# Patient Record
Sex: Male | Born: 1940 | Race: Black or African American | Hispanic: No | Smoking: Never smoker
Health system: Southern US, Community
[De-identification: ages and names within clinical notes are randomized; demographics above are authoritative.]

## PROBLEM LIST (undated history)

## (undated) DIAGNOSIS — G912 (Idiopathic) normal pressure hydrocephalus: Secondary | ICD-10-CM

## (undated) DIAGNOSIS — E119 Type 2 diabetes mellitus without complications: Secondary | ICD-10-CM

## (undated) DIAGNOSIS — G43909 Migraine, unspecified, not intractable, without status migrainosus: Secondary | ICD-10-CM

## (undated) DIAGNOSIS — J42 Unspecified chronic bronchitis: Secondary | ICD-10-CM

## (undated) DIAGNOSIS — N412 Abscess of prostate: Secondary | ICD-10-CM

## (undated) DIAGNOSIS — I10 Essential (primary) hypertension: Secondary | ICD-10-CM

## (undated) DIAGNOSIS — E785 Hyperlipidemia, unspecified: Secondary | ICD-10-CM

## (undated) HISTORY — PX: TONSILLECTOMY: SUR1361

## (undated) HISTORY — PX: BRAIN SURGERY: SHX531

---

## 2006-01-20 ENCOUNTER — Inpatient Hospital Stay (HOSPITAL_COMMUNITY): Admission: EM | Admit: 2006-01-20 | Discharge: 2006-01-22 | Payer: Self-pay | Admitting: *Deleted

## 2006-01-25 ENCOUNTER — Encounter: Payer: Self-pay | Admitting: Emergency Medicine

## 2006-01-25 ENCOUNTER — Inpatient Hospital Stay (HOSPITAL_COMMUNITY): Admission: AD | Admit: 2006-01-25 | Discharge: 2006-02-15 | Payer: Self-pay | Admitting: Neurosurgery

## 2006-02-08 ENCOUNTER — Encounter (INDEPENDENT_AMBULATORY_CARE_PROVIDER_SITE_OTHER): Payer: Self-pay | Admitting: *Deleted

## 2006-02-21 ENCOUNTER — Encounter: Admission: RE | Admit: 2006-02-21 | Discharge: 2006-02-21 | Payer: Self-pay | Admitting: Neurosurgery

## 2006-03-11 ENCOUNTER — Encounter: Admission: RE | Admit: 2006-03-11 | Discharge: 2006-03-11 | Payer: Self-pay | Admitting: Neurosurgery

## 2006-06-05 ENCOUNTER — Encounter: Admission: RE | Admit: 2006-06-05 | Discharge: 2006-06-05 | Payer: Self-pay | Admitting: Neurosurgery

## 2006-07-08 ENCOUNTER — Encounter: Admission: RE | Admit: 2006-07-08 | Discharge: 2006-07-08 | Payer: Self-pay | Admitting: Neurosurgery

## 2006-11-04 IMAGING — CT CT HEAD W/O CM
1 series · 16 of 28 positions shown, 20 images · IV contrast (agent unspecified)
Comparison: 01/29/2006.

CLINICAL DATA: Headache.  Nausea.  Known hydrocephalus.
HEAD CT WITHOUT CONTRAST:
TECHNIQUE: Contiguous axial images were obtained from the base of the skull through the vertex according to standard protocol without contrast.

[Series 2: brain · axial · 0.47mm/px · z∈[+130,+252]mm · 16 of 28 slices shown, 20 images]
[im 2/28  brain]
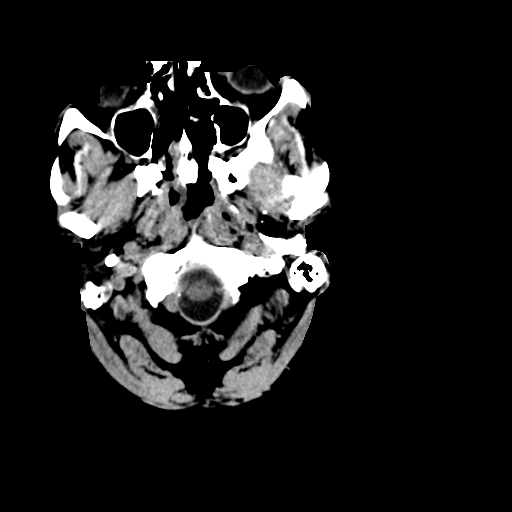
[im 2/28  bone]
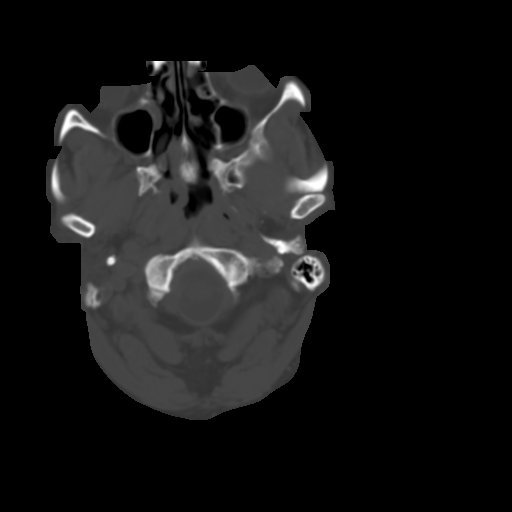
[im 4/28  brain]
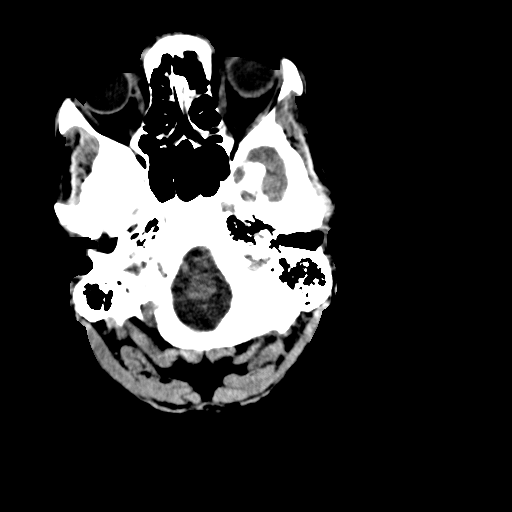
[im 6/28  brain]
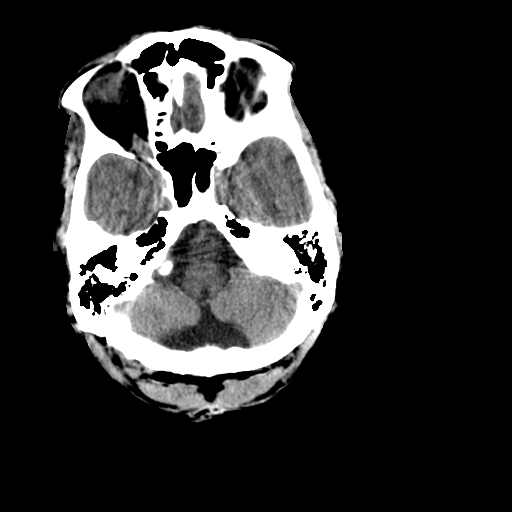
[im 7/28  brain]
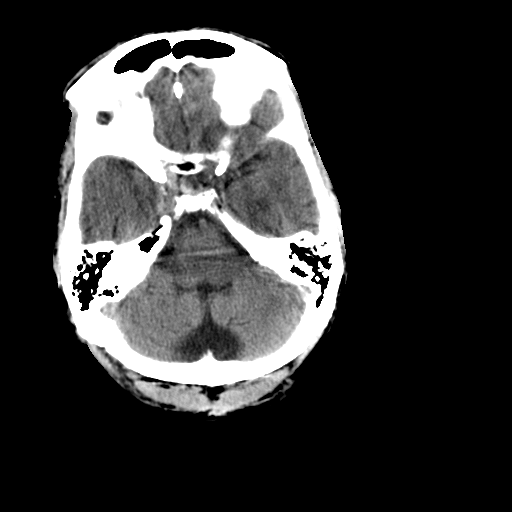
[im 9/28  brain]
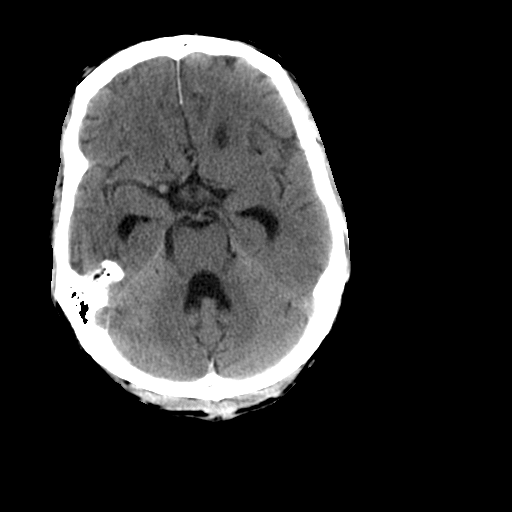
[im 9/28  bone]
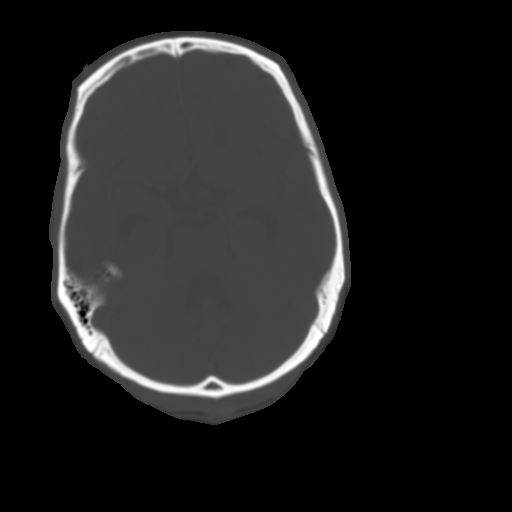
[im 10/28  brain]
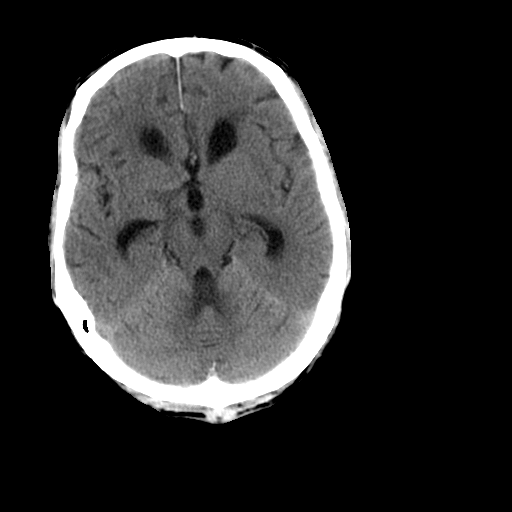
[im 12/28  brain]
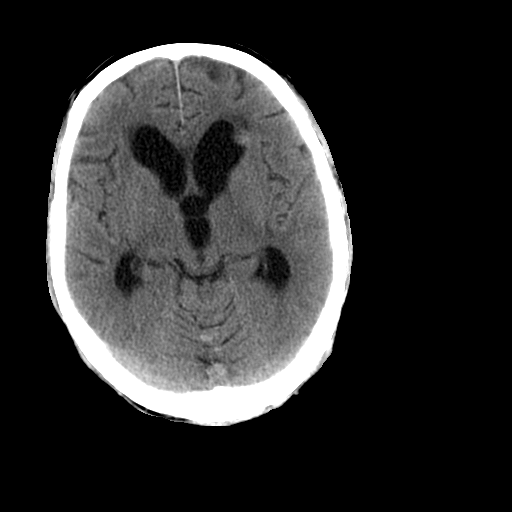
[im 14/28  brain]
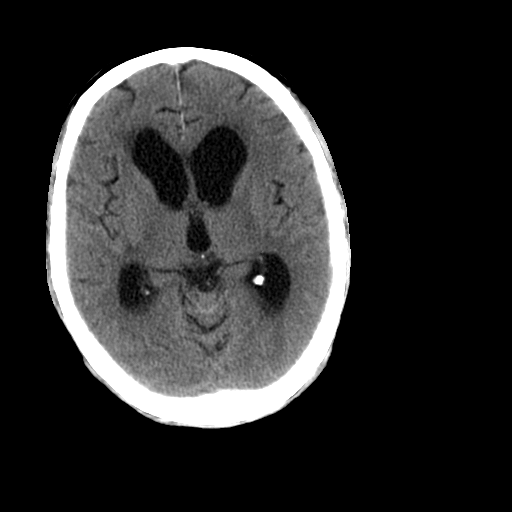
[im 15/28  brain]
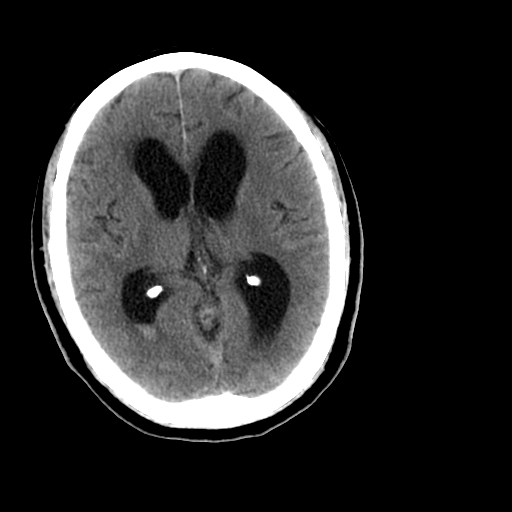
[im 15/28  bone]
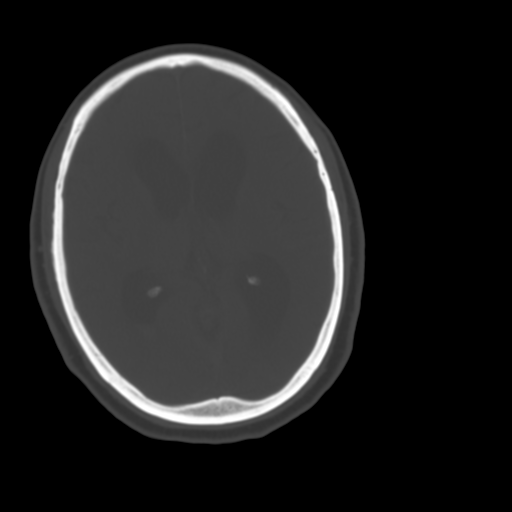
[im 17/28  brain]
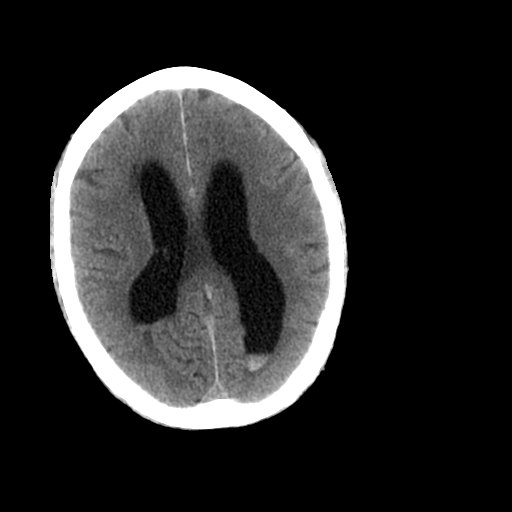
[im 19/28  brain]
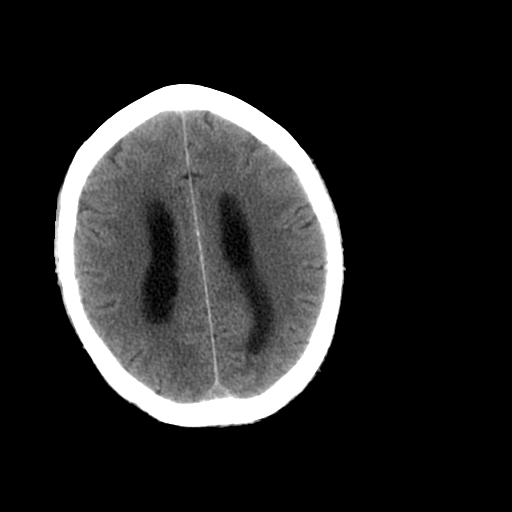
[im 20/28  brain]
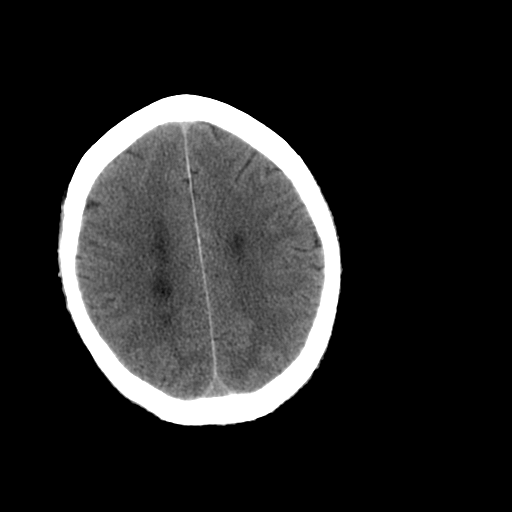
[im 22/28  brain]
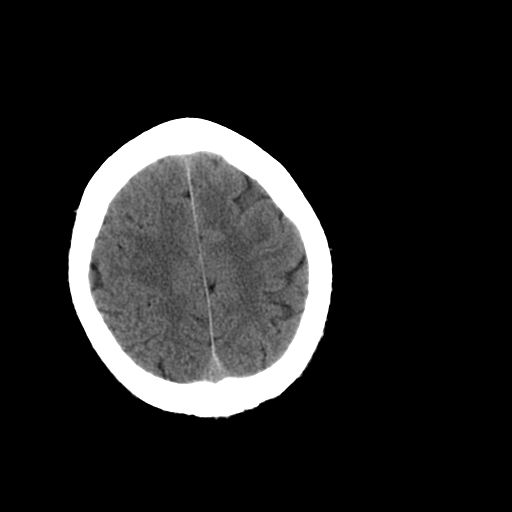
[im 22/28  bone]
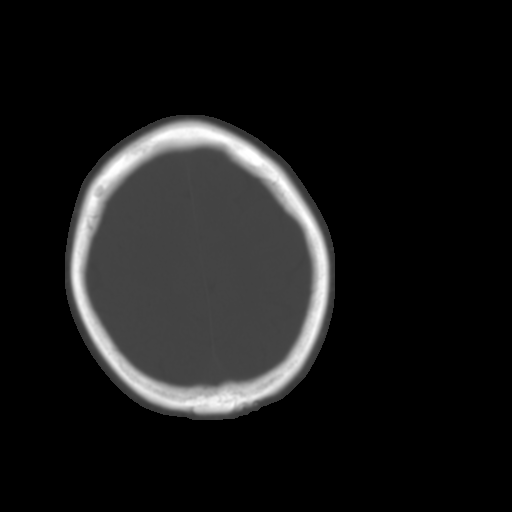
[im 23/28  brain]
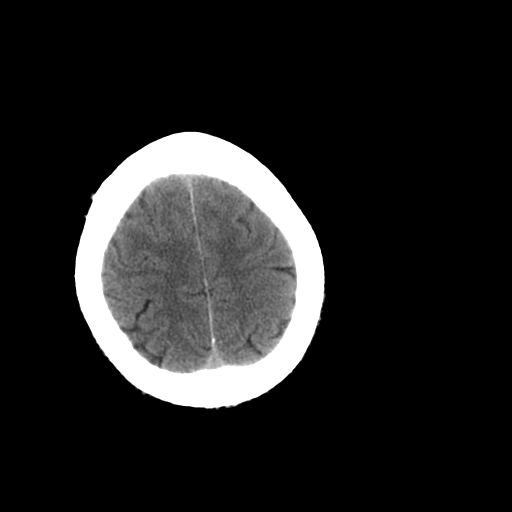
[im 25/28  brain]
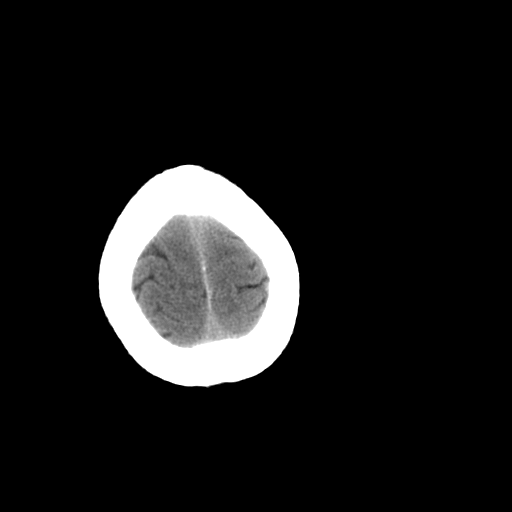
[im 27/28  brain]
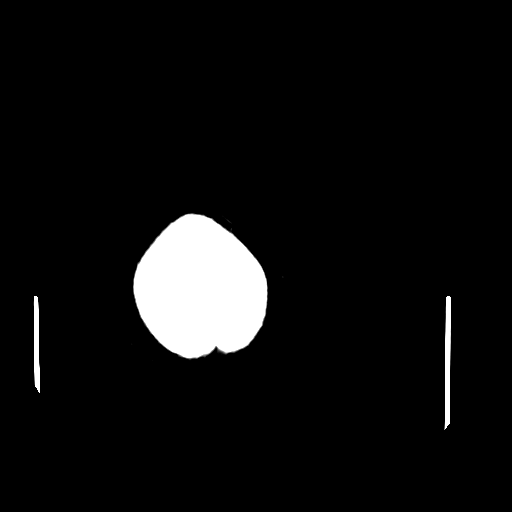

[16 of 28 positions shown; findings below may reference images not displayed]

FINDINGS: Again noted are blood breakdown products layering in the posterior aspect of the occipital horns.  Diffuse ventricular dilatation is again appreciated and has increased slightly since 01/29/2006.  Oval high attenuation focus left frontal periventricular region is stable.
IMPRESSION: Overall little change.  I get the impression that the diffuse hydrocephalus has increased minimally.  Again layering of blood breakdown products in the occipital horns and oval hemorrhagic focus encroaching on the subependymal layer of the lateral aspect of the left frontal horn.

## 2006-11-11 IMAGING — CT CT HEAD W/O CM
1 of 2 series · 13 of 30 positions shown, 17 images · non-contrast
Comparison: 02/05/2006

CLINICAL DATA: Subdural hematoma

HEAD CT WITHOUT CONTRAST
TECHNIQUE: 5mm collimated images were obtained from the base of the skull
through the vertex according to standard protocol without contrast.

[Series 2: brain · axial · 0.47mm/px · z∈[+126,+246]mm · 13 of 28 slices shown, 17 images]
[im 2/28  brain]
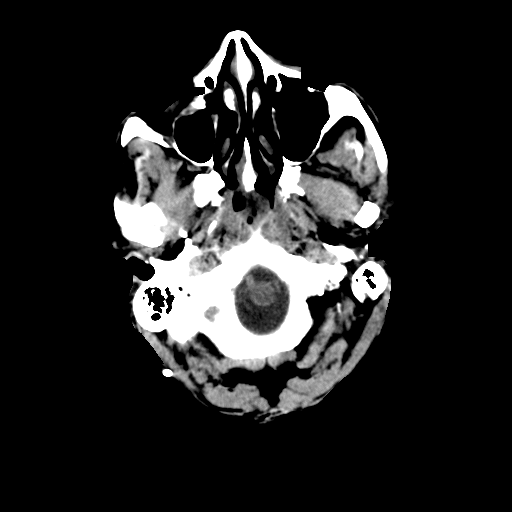
[im 2/28  bone]
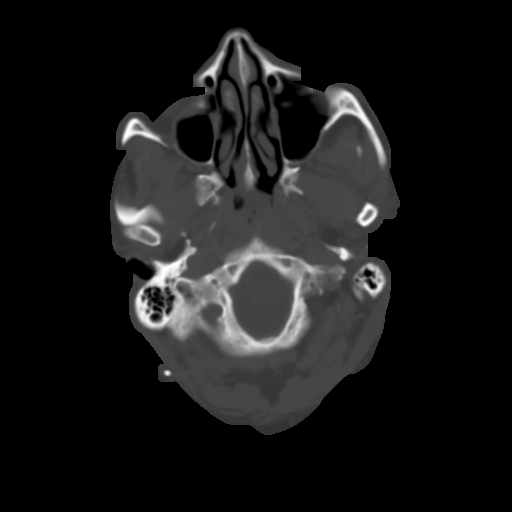
[im 4/28  brain]
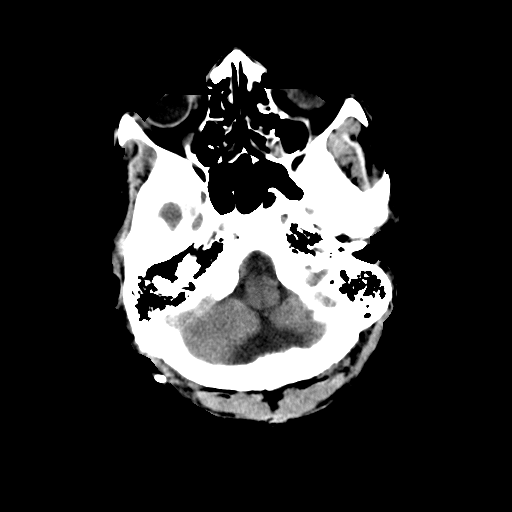
[im 6/28  brain]
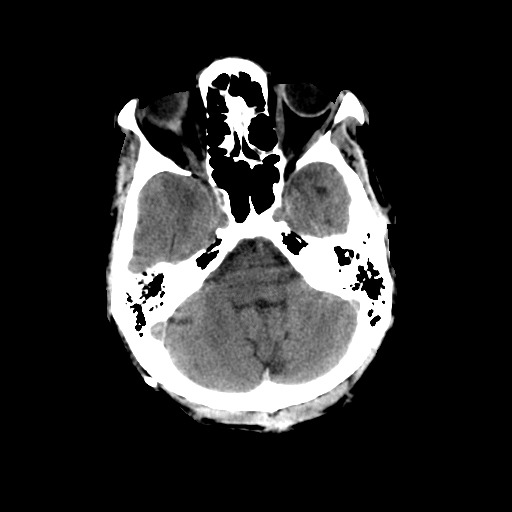
[im 8/28  brain]
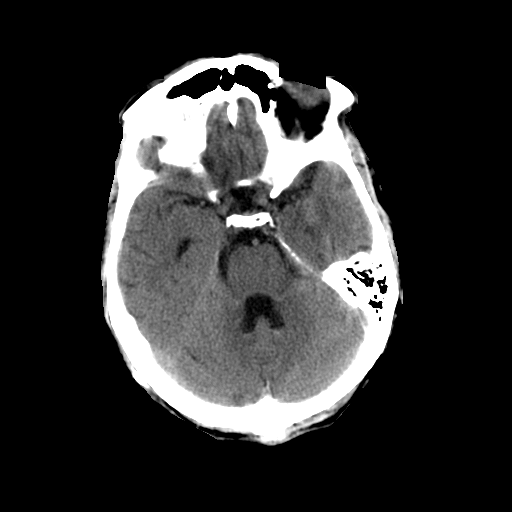
[im 10/28  brain]
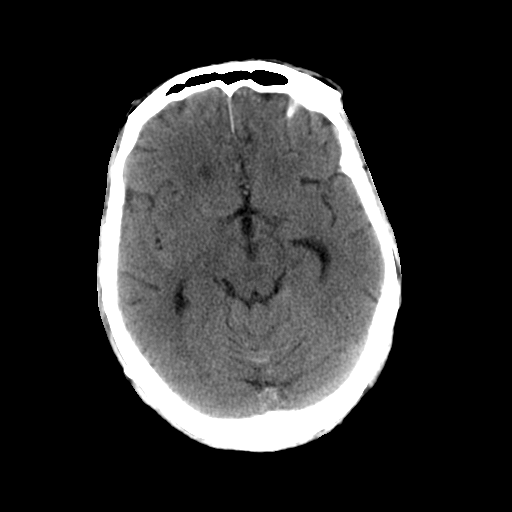
[im 10/28  bone]
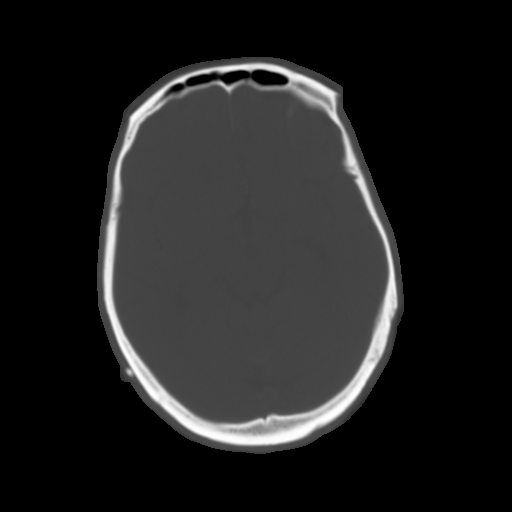
[im 12/28  brain]
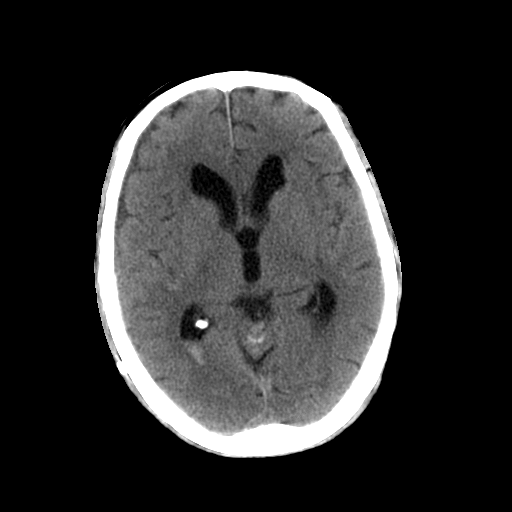
[im 14/28  brain]
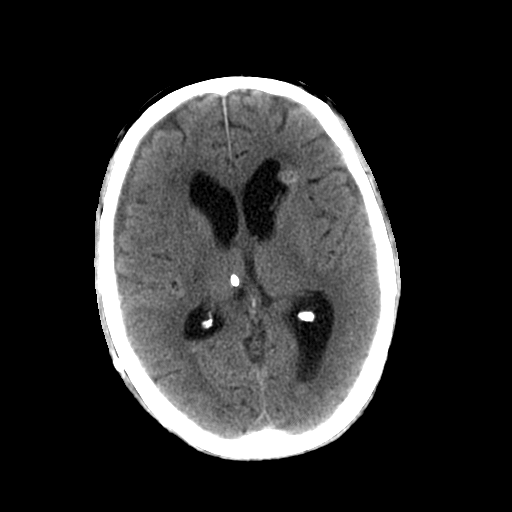
[im 16/28  brain]
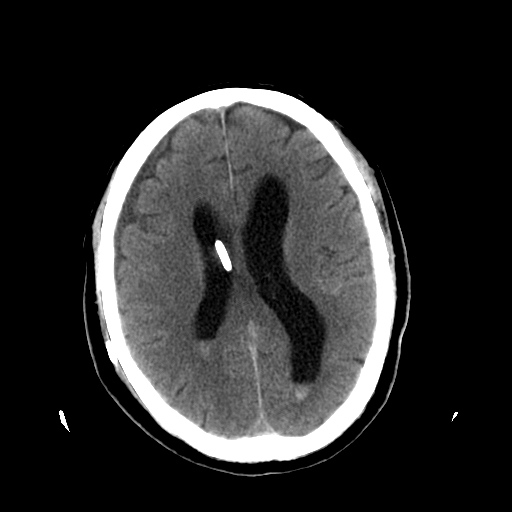
[im 18/28  brain]
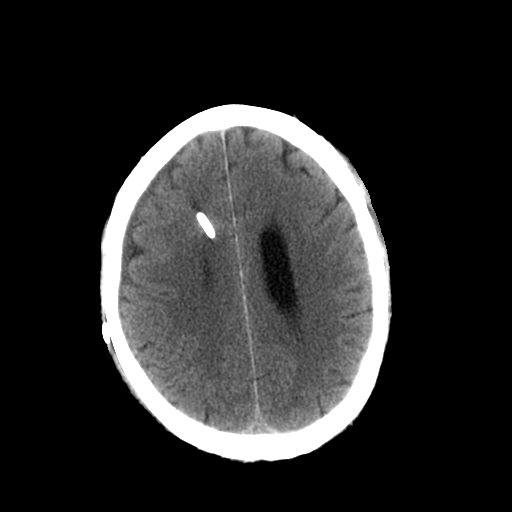
[im 18/28  bone]
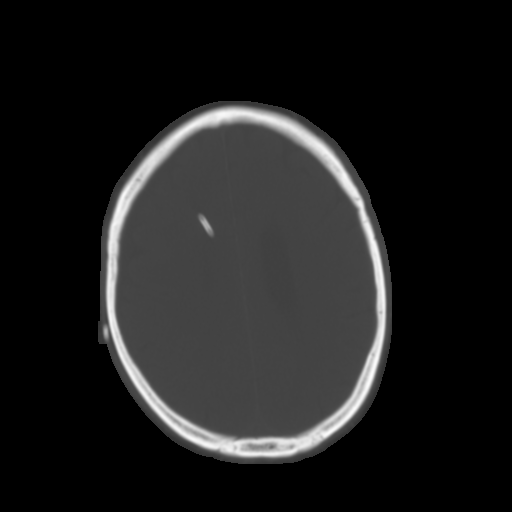
[im 20/28  brain]
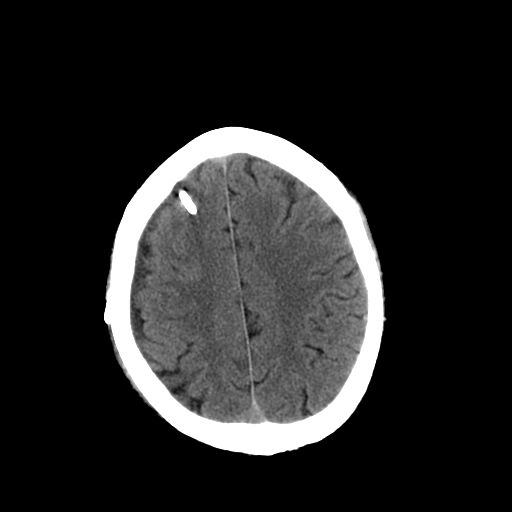
[im 22/28  brain]
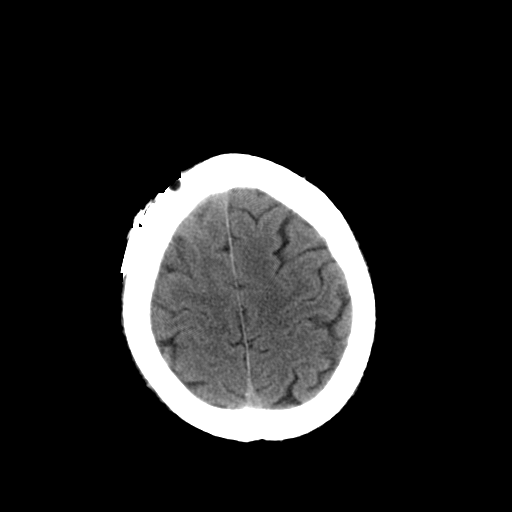
[im 24/28  brain]
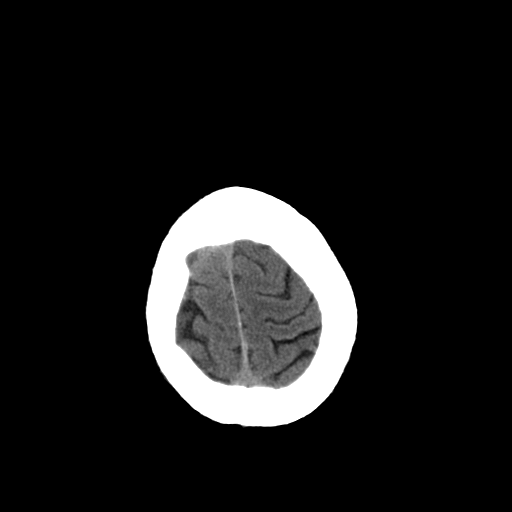
[im 26/28  brain]
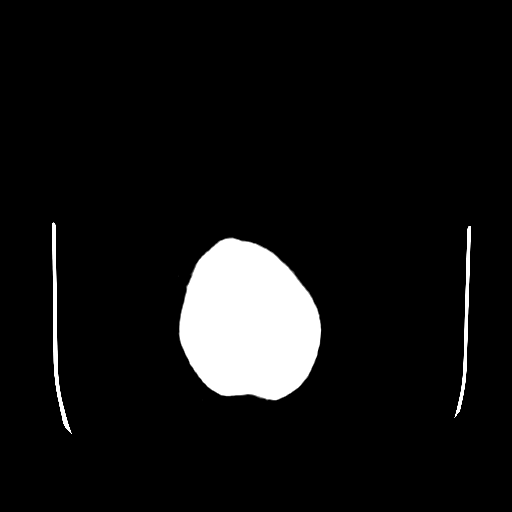
[im 26/28  bone]
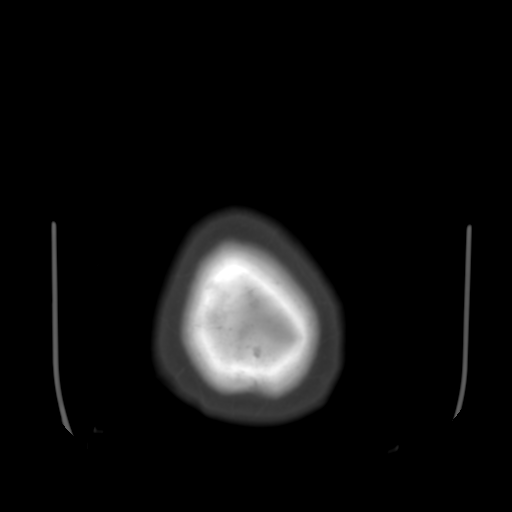

[13 of 30 positions shown; findings below may reference images not displayed]

FINDINGS: Stable intraventricular blood with decreasing hydrocephalus. Oval
high attenuation area in the left frontal periventricular region is stable.
Interval placement of ventricular drain.

IMPRESSION

Interval placement of ventricular drain with slight decreased hydrocephalus.
Otherwise no change.

## 2008-12-23 ENCOUNTER — Emergency Department (HOSPITAL_COMMUNITY): Admission: EM | Admit: 2008-12-23 | Discharge: 2008-12-23 | Payer: Self-pay | Admitting: Emergency Medicine

## 2011-05-11 NOTE — H&P (Signed)
NAME:  Bob Vazquez, Bob Vazquez                ACCOUNT NO.:  [id number]   MEDICAL RECORD NO.:  [id number]          PATIENT TYPE:  INP   LOCATION:  4739                         FACILITY:  [facility]   PHYSICIAN:  Fleet Contras, M.D.    DATE OF BIRTH:  1941/05/21   DATE OF ADMISSION:  01/20/2006  DATE OF DISCHARGE:                                HISTORY & PHYSICAL   PRESENTING COMPLAINT:  Weakness, abdominal pain, nausea, vomiting, and  diarrhea of one week duration.   HISTORY OF PRESENT COMPLAINT:  Bob Vazquez is a 70 year old African-American  gentleman with past medical history significant for type 2 diabetes  mellitus, hypertension, dyslipidemia.  He presented to the emergency room at  [facility] brought in by his sister with complaints of progressive  weakness over the last week.  He was seen in my office about 10 days prior  to presentation and was diagnosed with acute prostatitis and started on oral  ciprofloxacin.  He states in the last couple of days he developed abdominal  pain, nausea, vomiting, diarrhea, and unable to keep any food down.  He  denies any hemoptysis, hematochezia, or melena stools.  He states his urine  symptoms have improved in the form of bilateral loin pains radiating to the  groin and this has subsided.  He, however, has not been able to keep much  food or drinks down and has become very weak and the sister said this  morning could not get him up from bed.  On his admission in the emergency  room he was noted to be dehydrated, although his vital signs were stable.  His initial laboratory data showed a negative urinalysis for infection but  his glucose was over 8000.  Specific gravity was 1.035 and ketones were  positive.  His glucose on i-STAT was 229.  He is therefore being admitted  with a case of dehydration secondary to gastroenteritis.   PAST MEDICAL HISTORY:  1.  Type 2 diabetes mellitus.  2.  Hypertension.  3.  History of recent acute prostatitis on  ciprofloxacin.  4.  Dyslipidemia.  5.  Erectile dysfunction.  6.  Chronic low back pain.   PAST SURGICAL HISTORY:  Not significant.   FAMILY HISTORY:  He currently lives by himself.  His father died of a heart  attack and his mother died of diabetes.  He has 6 siblings:  Two are  deceased, four have diabetes, two have cancer of the throat.  He has one  child, unknown health.  He does not smoke tobacco.  He drinks alcohol  occasionally.  He denies any use of illicit drugs.   REVIEW OF SYSTEMS:  He denies any headaches, dizziness, or blurring of  vision.  He had no chest pain, no shortness of breath, orthopnea, or PND.  He does have a slight cough, but no sputum production, no hemoptysis or  wheezing.  He denies any urinary frequency, dysuria, hematuria.  MUSCULOSKELETAL:  He did have some low back pain, but no muscle pain or  cramps.   PHYSICAL EXAMINATION:  GENERAL:  He is lying  comfortably in bed.  Not in  acute respiratory or painful distress.  He is dehydrated with dry oral and  tongue mucosa.  VITAL SIGNS:  Blood pressure 146/79, heart rate of 89, regular, respiratory  rate of 18, temperature 98.7, O2 saturations on room air 96%.  HEENT:  Pupils are equal, round, and reactive to light and accommodation.  He is not pale.  He is not icteric.  He is not cyanosed.  NECK:  Supple with no elevated JVD.  No cervical lymphadenopathy.  CHEST:  Good air entry bilaterally with no rales, rhonchi, or wheezes.  ABDOMEN:  Soft, nontender.  No masses.  Bowel sounds are present.  EXTREMITIES:  No edema.  No calf tenderness or swelling.  CNS:  Alert and oriented x3 with no focal neurological deficits.   LABORATORY DATA:  Glucose 229.  His lipase was less than 10.  Creatinine was  1.5.  His LFTs were within normal limits.  Urinalysis was positive for  glucose more than 1000, specific gravity 1.037, and ketones were 40  positive.   ASSESSMENT:  Bob Vazquez is a 70 year old African-American  gentleman with  multiple medical problems.  He is being admitted to the hospital with acute  gastroenteritis most likely due to medications such as metformin and  ciprofloxacin.   ADMITTING DIAGNOSES:  1.  Acute gastroenteritis.  2.  Dehydration.  3.  Lethargy.  4.  Type 2 diabetes mellitus poorly controlled.   PLAN OF CARE:  He will be admitted to a telemetry bed.  He will be placed on  clear liquid diet.  This will be advanced as tolerated.  CBGs were checked  a.c. and h.s. with sliding scale NovoLog coverage.  He will be on IV  Protonix 40 mg daily, IV Cipro 400 mg daily, IV Phenergan 12.5 mg q.6h.  p.r.n. for nausea and vomiting, Imodium 2 mg per loose bowel movement,  maximum of 16 mg per day.  His vital signs and electrolytes will be  monitored closely and treatment adjusted accordingly.      Fleet Contras, M.D.  Electronically Signed     EA/MEDQ  D:  01/21/2006  T:  01/21/2006  Job:  [id number]

## 2011-05-11 NOTE — Discharge Summary (Signed)
NAME:  Bob Vazquez, Bob Vazquez NO.:  [id number]   MEDICAL RECORD NO.:  [id number]          PATIENT TYPE:  INP   LOCATION:  3034                         FACILITY:  [facility]   PHYSICIAN:  Payton Doughty, M.D.      DATE OF BIRTH:  October 02, 1941   DATE OF ADMISSION:  01/25/2006  DATE OF DISCHARGE:  02/15/2006                                 DISCHARGE SUMMARY   ADMITTING DIAGNOSIS:  Intracranial hemorrhage, hydrocephalus.   DISCHARGE DIAGNOSIS:  Intracranial hemorrhage, hydrocephalus.   PROCEDURE:  Right frontal ventricular peritoneal shunt.   HOSPITAL COURSE:  A 70 year old right handed black gentleman.  He had a  history of dystaxia of memory and incontinence.  He went to [facility].  He  had a hydrocephalus, about a 1 cm left frontal hematoma with a small  interventricular hemorrhage.  Medical history he was in [facility] a week ago is  of dehydration, prostatitis, dehydration.  He has diabetes and he is on  Lantus.  He is on Protonix, Cipro and Lipitor.  In general, he is awake and  oriented x3.  He had full strength.  He had slightly diminished sensation in  his lower extremities.  He was dystaxic, incontinent and demented.  I  believe the blood to be traumatic from a fall.  He was admitted and  observed.  He did not get any worse, but we allowed several weeks for the  blood to clear from out of his CNS.  A shunt LP was not done.  MRI did not  show any evidence of metastatic disease.  He was taken to the operating room  on February 08, 2006, and underwent a right frontal ventricular peritoneal  shunt.  A CSF was sent and was negative for pathologic cells, glucose or  protein.  Postoperatively, he began to improve almost immediately.  He was  awake, alert and oriented.  Participated in physical therapy, and was up and  about walking, using the walker only slightly.  His followup will be in the  [facility] in about a week for suture.     ______________________________  Payton Doughty, M.D.     MWR/MEDQ  D:  06/07/2006  T:  06/07/2006  Job:  [id number]

## 2011-05-11 NOTE — Discharge Summary (Signed)
NAME:  DAVEION, ROBAR NO.:  [id number]   MEDICAL RECORD NO.:  [id number]          PATIENT TYPE:  INP   LOCATION:  4739                         FACILITY:  [facility]   PHYSICIAN:  Fleet Contras, M.D.    DATE OF BIRTH:  [birth year]   DATE OF ADMISSION:  01/20/2006  DATE OF DISCHARGE:  01/22/2006                                 DISCHARGE SUMMARY   PRESENTATION:  Mr. Stavely is a 70 year old African-American gentleman with  past medical history significant for type 2 diabetes mellitus, hypertension,  dyslipidemia.  He was admitted via the emergency room at [facility]  where he presented with several weeks of feeling unwell, progressively  getting worse with weakness, fatigue, and nausea, vomiting, and diarrhea.  He was apparently unable to keep any food or drinks down.  He had been seen  in my office about two weeks prior to presentation with acute prostatitis  and was treated with oral ciprofloxacin.  His companion that lives with him  stated that he displaced the medication for a few days and did not take it  until just recently.  He did not have any fevers or chills.  He had no  hemoptysis, hematochezia, or melena stools.  In the emergency room he was  noted to be dehydrated and he was therefore admitted for further evaluation  and appropriate therapy.   ADMITTING DIAGNOSES:  1.  Acute gastroenteritis.  2.  Dehydration.  3.  Lethargy.  4.  Uncontrolled type 2 diabetes mellitus.   HOSPITAL COURSE:  On admission patient was placed on a telemetry bed.  He  was initially on clear liquid diet and this was advanced as tolerated to 2 g  sodium carbohydrate modified diet which he was able to tolerate.  CBGs were  monitored q.a.c. and h.s. and with a sliding scale NovoLog coverage.  He was  on IV ciprofloxacin for 24 hours as well as IV Protonix.  He did not have  any further nausea, vomiting, or diarrhea during hospitalization and within  24 hours he was able to  tolerate full oral intake.  Today he is feeling much  better.  He is ambulant.  He has not had any further abdominal pain.  No  vomiting.  No diarrhea.  His vital signs are stable.  He is well hydrated.  He is not icteric.  He is not cyanotic and he is afebrile.  His chest shows  good air entry with no rales or rhonchi.  Abdomen is benign.  Bowel sounds  are present.  Extremities show no edema or calf tenderness.  He is alert and  oriented x3 with no focal neurological deficits.  He is therefore considered  stable for discharge today.   DISCHARGE DIAGNOSES:  1.  Dehydration now resolved.  2.  Lethargy now resolved.  3.  Hypokalemia now repleted.  4.  Type 2 diabetes mellitus poorly controlled, improved.  5.  Acute prostatitis currently asymptomatic.   His latest laboratory data from January 22, 2006 showed a sodium of 135,  potassium 4.3, chloride 102, bicarbonate of 28, BUN of  6, creatinine 1.1,  and glucose of 157.   DISCHARGE MEDICATIONS:  1.  Protonix 40 mg daily.  2.  Ciprofloxacin 500 mg b.i.d. for five more days.  3.  Lantus insulin 50 units q.h.s.  4.  Lipitor 40 mg daily.  5.  He has been advised to stop using Metformin at home.   He will be followed up in my office on Friday, January 25, 2006.  His plan  of care was discussed with him and his live-in-partner and their questions  were answered.      Fleet Contras, M.D.  Electronically Signed     EA/MEDQ  D:  01/22/2006  T:  01/22/2006  Job:  [id number]

## 2011-05-11 NOTE — Discharge Summary (Signed)
NAME:  Bob Vazquez, Bob Vazquez NO.:  [id number]   MEDICAL RECORD NO.:  [id number]          PATIENT TYPE:  INP   LOCATION:  3034                         FACILITY:  [facility]   PHYSICIAN:  Payton Doughty, M.D.      DATE OF BIRTH:  [birth year]   DATE OF ADMISSION:  01/25/2006  DATE OF DISCHARGE:  02/15/2006                                 DISCHARGE SUMMARY   ADMITTING DIAGNOSES:  Hydrocephalus.   DISCHARGE DIAGNOSES:  Hydrocephalus.   PROCEDURE:  Ventricular peritoneal shunt.   COMPLICATIONS:  None.   DISCHARGE STATUS:  Doing well.   BODY TEXT:  This is a 70 year old right-handed black gentleman whose history  and physical examination is recounted on the chart. He had difficulty with  dystaxia, memory, incontinence and had taken some falls, went to [facility]  Emergency Room. CT showed hydrocephalus with small amount of hematoma and  interventricular hemorrhage. He was admitted and evaluated for gait by  physical therapy as well as had serial CT scans done which demonstrated some  clearing of the blood. It was felt that the hematoma in periventricular  region was not a mass. There was no enhancement of contrast and it was felt  to be traumatic. As the blood was cleared from his ventricles, ventricles  did continue to enlarge. His dystaxia became worse as did his difficulty  with memory. He was also followed by Dr. Concepcion Elk of the medicine service who  was extremely helpful in management of his diabetes and other medical  problems. Because the hydrocephalus was of a degree that the patient was  felt to need a shunt anyhow, it was decided to allow the blood to clear from  the ventricles and take the patient for ventricular peritoneal shunting, all  the while monitoring him to make sure that he did not develop any signs of  herniation. He did extremely well under this plan and underwent ventricular  peritoneal shunting on February 08, 2006.   Postoperatively he has done extremely  well during physical therapy. His gait  has markedly improved although he still needs a walker. His mentation is  much better and he no longer has incontinence. Currently he is awake and  alert with a mildly dystaxic gait related peripheral neuropathy and is being  discharged home to the care of his family with 24 hour supervision.           ______________________________  Payton Doughty, M.D.     MWR/MEDQ  D:  02/15/2006  T:  02/15/2006  Job:  [id number]

## 2011-09-28 LAB — GLUCOSE, CAPILLARY: Glucose-Capillary: 273 mg/dL — ABNORMAL HIGH (ref 70–99)

## 2011-09-28 LAB — DIFFERENTIAL
Basophils Relative: 0 % (ref 0–1)
Eosinophils Absolute: 0 10*3/uL (ref 0.0–0.7)
Lymphs Abs: 1.1 10*3/uL (ref 0.7–4.0)
Monocytes Absolute: 0.1 10*3/uL (ref 0.1–1.0)
Monocytes Relative: 3 % (ref 3–12)
Neutro Abs: 3.5 10*3/uL (ref 1.7–7.7)

## 2011-09-28 LAB — COMPREHENSIVE METABOLIC PANEL
ALT: 24 U/L (ref 0–53)
Albumin: 3.8 g/dL (ref 3.5–5.2)
Alkaline Phosphatase: 72 U/L (ref 39–117)
Calcium: 8.8 mg/dL (ref 8.4–10.5)
Potassium: 4.3 mEq/L (ref 3.5–5.1)
Sodium: 139 mEq/L (ref 135–145)
Total Protein: 7 g/dL (ref 6.0–8.3)

## 2011-09-28 LAB — CBC
Platelets: 211 10*3/uL (ref 150–400)
RDW: 14.3 % (ref 11.5–15.5)

## 2012-07-31 ENCOUNTER — Emergency Department (HOSPITAL_COMMUNITY): Payer: Medicare Other

## 2012-07-31 ENCOUNTER — Inpatient Hospital Stay (HOSPITAL_COMMUNITY): Payer: Medicare Other

## 2012-07-31 ENCOUNTER — Inpatient Hospital Stay (HOSPITAL_COMMUNITY)
Admission: EM | Admit: 2012-07-31 | Discharge: 2012-08-03 | DRG: 683 | Disposition: A | Discharge status: Home Health | Payer: Medicare Other | Attending: Internal Medicine | Admitting: Internal Medicine

## 2012-07-31 ENCOUNTER — Encounter (HOSPITAL_COMMUNITY): Payer: Self-pay | Admitting: *Deleted

## 2012-07-31 DIAGNOSIS — E870 Hyperosmolality and hypernatremia: Secondary | ICD-10-CM | POA: Diagnosis present

## 2012-07-31 DIAGNOSIS — R269 Unspecified abnormalities of gait and mobility: Secondary | ICD-10-CM

## 2012-07-31 DIAGNOSIS — N289 Disorder of kidney and ureter, unspecified: Secondary | ICD-10-CM

## 2012-07-31 DIAGNOSIS — I10 Essential (primary) hypertension: Secondary | ICD-10-CM | POA: Diagnosis present

## 2012-07-31 DIAGNOSIS — E860 Dehydration: Secondary | ICD-10-CM

## 2012-07-31 DIAGNOSIS — Z23 Encounter for immunization: Secondary | ICD-10-CM

## 2012-07-31 DIAGNOSIS — IMO0001 Reserved for inherently not codable concepts without codable children: Secondary | ICD-10-CM | POA: Diagnosis present

## 2012-07-31 DIAGNOSIS — E785 Hyperlipidemia, unspecified: Secondary | ICD-10-CM | POA: Diagnosis present

## 2012-07-31 DIAGNOSIS — E871 Hypo-osmolality and hyponatremia: Secondary | ICD-10-CM | POA: Diagnosis present

## 2012-07-31 DIAGNOSIS — E119 Type 2 diabetes mellitus without complications: Secondary | ICD-10-CM

## 2012-07-31 DIAGNOSIS — G912 (Idiopathic) normal pressure hydrocephalus: Secondary | ICD-10-CM | POA: Diagnosis present

## 2012-07-31 DIAGNOSIS — R4182 Altered mental status, unspecified: Secondary | ICD-10-CM | POA: Diagnosis present

## 2012-07-31 DIAGNOSIS — N179 Acute kidney failure, unspecified: Principal | ICD-10-CM | POA: Diagnosis present

## 2012-07-31 DIAGNOSIS — N3941 Urge incontinence: Secondary | ICD-10-CM

## 2012-07-31 DIAGNOSIS — Z982 Presence of cerebrospinal fluid drainage device: Secondary | ICD-10-CM

## 2012-07-31 DIAGNOSIS — R29898 Other symptoms and signs involving the musculoskeletal system: Secondary | ICD-10-CM | POA: Diagnosis present

## 2012-07-31 HISTORY — DX: Migraine, unspecified, not intractable, without status migrainosus: G43.909

## 2012-07-31 HISTORY — DX: Type 2 diabetes mellitus without complications: E11.9

## 2012-07-31 HISTORY — DX: (Idiopathic) normal pressure hydrocephalus: G91.2

## 2012-07-31 HISTORY — DX: Essential (primary) hypertension: I10

## 2012-07-31 HISTORY — DX: Hyperlipidemia, unspecified: E78.5

## 2012-07-31 HISTORY — DX: Unspecified chronic bronchitis: J42

## 2012-07-31 LAB — CBC WITH DIFFERENTIAL/PLATELET
Eosinophils Relative: 0 % (ref 0–5)
HCT: 35.6 % — ABNORMAL LOW (ref 39.0–52.0)
Hemoglobin: 12.7 g/dL — ABNORMAL LOW (ref 13.0–17.0)
Lymphocytes Relative: 6 % — ABNORMAL LOW (ref 12–46)
Lymphs Abs: 0.6 10*3/uL — ABNORMAL LOW (ref 0.7–4.0)
MCV: 81.5 fL (ref 78.0–100.0)
Monocytes Absolute: 0.7 10*3/uL (ref 0.1–1.0)
Monocytes Relative: 7 % (ref 3–12)
Neutro Abs: 9 10*3/uL — ABNORMAL HIGH (ref 1.7–7.7)
WBC: 10.3 10*3/uL (ref 4.0–10.5)

## 2012-07-31 LAB — COMPREHENSIVE METABOLIC PANEL
AST: 55 U/L — ABNORMAL HIGH (ref 0–37)
BUN: 24 mg/dL — ABNORMAL HIGH (ref 6–23)
CO2: 26 mEq/L (ref 19–32)
Chloride: 90 mEq/L — ABNORMAL LOW (ref 96–112)
Creatinine, Ser: 1.94 mg/dL — ABNORMAL HIGH (ref 0.50–1.35)
GFR calc Af Amer: 38 mL/min — ABNORMAL LOW (ref >90)
GFR calc non Af Amer: 33 mL/min — ABNORMAL LOW (ref >90)
Glucose, Bld: 385 mg/dL — ABNORMAL HIGH (ref 70–99)
Total Bilirubin: 0.8 mg/dL (ref 0.3–1.2)

## 2012-07-31 LAB — GLUCOSE, CAPILLARY
Glucose-Capillary: 254 mg/dL — ABNORMAL HIGH (ref 70–99)
Glucose-Capillary: 263 mg/dL — ABNORMAL HIGH (ref 70–99)

## 2012-07-31 LAB — URINALYSIS, ROUTINE W REFLEX MICROSCOPIC
Nitrite: NEGATIVE
Protein, ur: 100 mg/dL — AB
Urobilinogen, UA: 1 mg/dL (ref 0.0–1.0)

## 2012-07-31 LAB — RAPID URINE DRUG SCREEN, HOSP PERFORMED
Cocaine: NOT DETECTED
Opiates: NOT DETECTED
Tetrahydrocannabinol: NOT DETECTED

## 2012-07-31 LAB — URINE MICROSCOPIC-ADD ON

## 2012-07-31 LAB — CREATININE, SERUM
GFR calc Af Amer: 48 mL/min — ABNORMAL LOW (ref >90)
GFR calc non Af Amer: 42 mL/min — ABNORMAL LOW (ref >90)

## 2012-07-31 LAB — CBC
HCT: 34.4 % — ABNORMAL LOW (ref 39.0–52.0)
MCV: 82.7 fL (ref 78.0–100.0)
RBC: 4.16 MIL/uL — ABNORMAL LOW (ref 4.22–5.81)
RDW: 15.6 % — ABNORMAL HIGH (ref 11.5–15.5)
WBC: 8.1 10*3/uL (ref 4.0–10.5)

## 2012-07-31 MED ORDER — ONDANSETRON HCL 4 MG/2ML IJ SOLN: 4.0000 mg | Freq: Three times a day (TID) | INTRAMUSCULAR | Status: AC | PRN

## 2012-07-31 MED ORDER — SODIUM CHLORIDE 0.9 % IV BOLUS (SEPSIS)
1000.0000 mL | Freq: Once | INTRAVENOUS | Status: AC
  Administered 2012-07-31: 1000 mL via INTRAVENOUS

## 2012-07-31 MED ORDER — AMLODIPINE BESYLATE 5 MG PO TABS
5.0000 mg | ORAL_TABLET | Freq: Every day | ORAL | Status: DC
  Administered 2012-07-31 – 2012-08-03 (×4): 5 mg via ORAL
  Filled 2012-07-31 (×4): qty 1

## 2012-07-31 MED ORDER — INSULIN ASPART 100 UNIT/ML ~~LOC~~ SOLN
0.0000 [IU] | Freq: Every day | SUBCUTANEOUS | Status: DC
  Administered 2012-07-31: 3 [IU] via SUBCUTANEOUS
  Administered 2012-08-01: 2 [IU] via SUBCUTANEOUS

## 2012-07-31 MED ORDER — INSULIN GLARGINE 100 UNIT/ML ~~LOC~~ SOLN
15.0000 [IU] | Freq: Every day | SUBCUTANEOUS | Status: DC
  Administered 2012-07-31 – 2012-08-02 (×3): 15 [IU] via SUBCUTANEOUS

## 2012-07-31 MED ORDER — ACETAMINOPHEN 650 MG RE SUPP: 650.0000 mg | Freq: Four times a day (QID) | RECTAL | Status: DC | PRN

## 2012-07-31 MED ORDER — ENOXAPARIN SODIUM 40 MG/0.4ML ~~LOC~~ SOLN
40.0000 mg | SUBCUTANEOUS | Status: DC
  Administered 2012-07-31 – 2012-08-02 (×3): 40 mg via SUBCUTANEOUS
  Filled 2012-07-31 (×4): qty 0.4

## 2012-07-31 MED ORDER — INSULIN ASPART 100 UNIT/ML ~~LOC~~ SOLN
3.0000 [IU] | Freq: Three times a day (TID) | SUBCUTANEOUS | Status: DC
  Administered 2012-08-01 – 2012-08-03 (×8): 3 [IU] via SUBCUTANEOUS

## 2012-07-31 MED ORDER — OXYCODONE HCL 5 MG PO TABS: 5.0000 mg | ORAL_TABLET | ORAL | Status: DC | PRN

## 2012-07-31 MED ORDER — NALOXONE HCL 0.4 MG/ML IJ SOLN: 0.4000 mg | INTRAMUSCULAR | Status: DC | PRN

## 2012-07-31 MED ORDER — GADOBENATE DIMEGLUMINE 529 MG/ML IV SOLN
18.0000 mL | Freq: Once | INTRAVENOUS | Status: AC
  Administered 2012-07-31: 18 mL via INTRAVENOUS

## 2012-07-31 MED ORDER — SODIUM CHLORIDE 0.9 % IV SOLN: INTRAVENOUS | Status: AC

## 2012-07-31 MED ORDER — ACETAMINOPHEN 325 MG PO TABS
650.0000 mg | ORAL_TABLET | Freq: Four times a day (QID) | ORAL | Status: DC | PRN
  Administered 2012-08-02: 650 mg via ORAL
  Filled 2012-07-31: qty 2

## 2012-07-31 MED ORDER — PNEUMOCOCCAL VAC POLYVALENT 25 MCG/0.5ML IJ INJ
0.5000 mL | INJECTION | INTRAMUSCULAR | Status: AC
  Administered 2012-08-01: 0.5 mL via INTRAMUSCULAR
  Filled 2012-07-31: qty 0.5

## 2012-07-31 MED ORDER — SODIUM CHLORIDE 0.9 % IV SOLN
INTRAVENOUS | Status: DC
  Administered 2012-07-31 – 2012-08-03 (×3): via INTRAVENOUS

## 2012-07-31 MED ORDER — INSULIN ASPART 100 UNIT/ML ~~LOC~~ SOLN
0.0000 [IU] | Freq: Three times a day (TID) | SUBCUTANEOUS | Status: DC
  Administered 2012-08-01: 5 [IU] via SUBCUTANEOUS
  Administered 2012-08-01 – 2012-08-02 (×4): 3 [IU] via SUBCUTANEOUS
  Administered 2012-08-02: 5 [IU] via SUBCUTANEOUS
  Administered 2012-08-03: 2 [IU] via SUBCUTANEOUS
  Administered 2012-08-03: 3 [IU] via SUBCUTANEOUS

## 2012-07-31 MED ORDER — M.V.I. ADULT IV INJ
INJECTION | Freq: Once | INTRAVENOUS | Status: AC
  Administered 2012-07-31: 09:00:00 via INTRAVENOUS
  Filled 2012-07-31: qty 1000

## 2012-07-31 NOTE — ED Provider Notes (Signed)
[name] is a 71 y.o. male here for evaluation of urinary incontinence, weakness, and cough. He reports feeling better since treatment in the ED with IV fluids. He, states that he sees his PCP regularly. Patient was able to spontaneously void as I was examining him. I would like strength is symmetric bilaterally. He is able to sit on the stretcher, and support himself. Lungs are clear. He is alert and oriented to person, and place. An attempt at ambulation, failed; the patient. Could not stand at the bedside without support.  Medical decision-making: Nonspecifically this with hyperglycemia, dehydration, and renal insufficiency. His laboratory markers are changed from baseline. He is a frail, elderly man that will need stabilization prior to discharge to the home setting.   Patient's brother arrived (13:40) reports patient has been confused for 1 week.     Medical screening examination/treatment/procedure(s) were conducted as a shared visit with non-physician practitioner(s) and myself.  I personally evaluated the patient during the encounter  Flint Melter, MD 08/01/12 1622

## 2012-07-31 NOTE — ED Notes (Signed)
IV team notified.

## 2012-07-31 NOTE — ED Provider Notes (Signed)
History     CSN: [id number]  Arrival date & time 07/31/12  9528   First MD Initiated Contact with Patient 07/31/12 [phone]      Chief Complaint  Patient presents with  . Decreased ability to amb     (Consider location/radiation/quality/duration/timing/severity/associated sxs/prior treatment) HPI Comments: Bob Vazquez 71 y.o. male   The chief complaint is: Patient presents with:   Decreased ability to amb     Patient Here via EMS who states that the patient has decreased ability to ambulate and urinary incontinence. He is unable to provide a history as he has AMS and is confused.    The history is provided by medical records and the EMS personnel. The history is limited by the condition of the patient. No language interpreter was used.    Past Medical History  Diagnosis Date  . Diabetes mellitus   . Hypertension   . Hyperlipemia     No past surgical history on file.  No family history on file.  History  Substance Use Topics  . Smoking status: Not on file  . Smokeless tobacco: Not on file  . Alcohol Use:       Review of Systems  Unable to perform ROS   Allergies  Penicillins  Home Medications  No current outpatient prescriptions on file.  BP 180/107  Pulse 130  Resp 20  SpO2 93%  Physical Exam  Nursing note and vitals reviewed. Constitutional:       Patient is lying with eyes closed. He moans intermittently but denies pain  HENT:  Head: Normocephalic and atraumatic.       There is a surgical scar and bump on the right anterior skull.  Eyes: Conjunctivae are normal. Pupils are equal, round, and reactive to light. No scleral icterus.       Unable to assess EOM as patient cannot follow the command  Neck: Neck supple. No tracheal deviation present.  Cardiovascular: Regular rhythm and normal heart sounds.        tachycardic  Pulmonary/Chest: Breath sounds normal. No stridor. He has no wheezes. He has no rales. He exhibits no tenderness.   Patient will not take deep inhalations, denies pain  Abdominal: Soft. Bowel sounds are normal. He exhibits no distension and no mass. There is no tenderness. There is no guarding.  Lymphadenopathy:    He has no cervical adenopathy.  Neurological: He is alert. He has normal strength. He is disoriented (patient reponses are inappropriate to the questions asked. He does know where he is, but  pronounces  his name and the woed hospital incorrectly.). He displays no tremor. A cranial nerve deficit is present. No sensory deficit. He exhibits normal muscle tone.       Neuro grossly intact.  Unable to fully assess.   Skin: Skin is warm and dry. He is not diaphoretic.       Dry mucous membranes. Lips are cracked and dry.   Psychiatric: His speech is slurred. Cognition and memory are impaired. He exhibits abnormal recent memory.    ED Course  Procedures (including critical care time)  Results for orders placed during the hospital encounter of 07/31/12  CBC WITH DIFFERENTIAL      Component Value Range   WBC 10.3  4.0 - 10.5 K/uL   RBC 4.37  4.22 - 5.81 MIL/uL   Hemoglobin 12.7 (*) 13.0 - 17.0 g/dL   HCT 44.0 (*) 10.2 - 72.5 %   MCV 81.5  78.0 - 100.0 fL  MCH 29.1  26.0 - 34.0 pg   MCHC 35.7  30.0 - 36.0 g/dL   RDW 56.2  13.0 - 86.5 %   Platelets 288  150 - 400 K/uL   Neutrophils Relative 87 (*) 43 - 77 %   Neutro Abs 9.0 (*) 1.7 - 7.7 K/uL   Lymphocytes Relative 6 (*) 12 - 46 %   Lymphs Abs 0.6 (*) 0.7 - 4.0 K/uL   Monocytes Relative 7  3 - 12 %   Monocytes Absolute 0.7  0.1 - 1.0 K/uL   Eosinophils Relative 0  0 - 5 %   Eosinophils Absolute 0.0  0.0 - 0.7 K/uL   Basophils Relative 0  0 - 1 %   Basophils Absolute 0.0  0.0 - 0.1 K/uL  COMPREHENSIVE METABOLIC PANEL      Component Value Range   Sodium 127 (*) 135 - 145 mEq/L   Potassium 4.6  3.5 - 5.1 mEq/L   Chloride 90 (*) 96 - 112 mEq/L   CO2 26  19 - 32 mEq/L   Glucose, Bld 385 (*) 70 - 99 mg/dL   BUN 24 (*) 6 - 23 mg/dL    Creatinine, Ser 7.84 (*) 0.50 - 1.35 mg/dL   Calcium 8.8  8.4 - 69.6 mg/dL   Total Protein 7.8  6.0 - 8.3 g/dL   Albumin 2.6 (*) 3.5 - 5.2 g/dL   AST 55 (*) 0 - 37 U/L   ALT 56 (*) 0 - 53 U/L   Alkaline Phosphatase 140 (*) 39 - 117 U/L   Total Bilirubin 0.8  0.3 - 1.2 mg/dL   GFR calc non Af Amer 33 (*) >90 mL/min   GFR calc Af Amer 38 (*) >90 mL/min  URINALYSIS, ROUTINE W REFLEX MICROSCOPIC      Component Value Range   Color, Urine YELLOW  YELLOW   APPearance CLEAR  CLEAR   Specific Gravity, Urine 1.029  1.005 - 1.030   pH 5.5  5.0 - 8.0   Glucose, UA >1000 (*) NEGATIVE mg/dL   Hgb urine dipstick MODERATE (*) NEGATIVE   Bilirubin Urine NEGATIVE  NEGATIVE   Ketones, ur 15 (*) NEGATIVE mg/dL   Protein, ur 295 (*) NEGATIVE mg/dL   Urobilinogen, UA 1.0  0.0 - 1.0 mg/dL   Nitrite NEGATIVE  NEGATIVE   Leukocytes, UA NEGATIVE  NEGATIVE  ETHANOL      Component Value Range   Alcohol, Ethyl (B) <11  0 - 11 mg/dL  URINE RAPID DRUG SCREEN (HOSP PERFORMED)      Component Value Range   Opiates NONE DETECTED  NONE DETECTED   Cocaine NONE DETECTED  NONE DETECTED   Benzodiazepines NONE DETECTED  NONE DETECTED   Amphetamines NONE DETECTED  NONE DETECTED   Tetrahydrocannabinol NONE DETECTED  NONE DETECTED   Barbiturates NONE DETECTED  NONE DETECTED  GLUCOSE, CAPILLARY      Component Value Range   Glucose-Capillary 369 (*) 70 - 99 mg/dL  URINE MICROSCOPIC-ADD ON      Component Value Range   Squamous Epithelial / LPF RARE  RARE   WBC, UA 0-2  <3 WBC/hpf   RBC / HPF 7-10  <3 RBC/hpf   Bacteria, UA RARE  RARE   Casts GRANULAR CAST (*) NEGATIVE   6:56 AM BP 180/107  Pulse 130  Resp 20  SpO2 93% Elevated BP, tachycardic without present dysrhytmia.  Pt. With AMS. Per EMS found in a house living with other people. There were empthy  40 bottles all over. Patient does not smell like ETOH, however he is confused and slurring words.    9:15 AM BP 104/89  Pulse 116  Resp 24  SpO2  96% Waiting on Patient urine samples  9:25 AM BP 104/89  Pulse 116  Resp 24  SpO2 96% Patient BP and Pulse trending down.  Labs show no acidosis No results found.  11:20 AM Patient is more coherent now and responding to questions appropriately after fluid bolus.  He however is a poor historian and difficult to understand. He states that  He has had leg weakness that started 1 week ago and has gotten increasingly worse.  He  Has bowel and bladder incontinence , but this has been an ongoing problem.  Denies fevers, chills, fatigue, night sweats, unexplained weight loss. He denies recent history of Flu vaccine or viral illness.  He denies any cough, SOB, Chest pain. He denies back pain or saddle anesthesia.  He states that he is unable to walk.  He did fall last Friday (6 days ago) after falling over a chair and knocked his front teeth out. He denies, HA, facial pain or trauma to his head.    1:29 PM BP 160/69  Pulse 99  Resp 20  SpO2 96% Patient states that he ids feeling much better, but he feels that he cannot walk.  He is coherent and answering questions appropriately now.   Per nursing staff, when asked to ambulate patients knees buckled right away.  I personally went in to help with bed pan change and had to hold the patient under his arms.  Patient is non ambulatory.  2:03 PM BP 160/69  Pulse 99  Resp 20  SpO2 96% Brother has arrived. He is also a poor historian.  States that the patient has not had altered mental status latley and that his confusion and weakness  And incontinence has been for the last week. He also states that He did not know his teeth out or hit his head, that his teeth were pulled some time ago. Patient also states that he did have confusion like this some years ago and had a surgery.  He is unsure of why. Upon review of his chart it appears that Mr. Stickels has a history of hydrocephalus and has an intraventricular shunt placed. Considering his confusion, dystaxia  , and urinary incontinence, It seems possible that he may have some ICP again.    4:00 PM BP 154/75  Pulse 97  Resp 20  SpO2 96% CT Head Wo Contrast (Final result)   Result time:07/31/12 1546    Final result by Rad Results In Interface (07/31/12 15:46:10)    Narrative:   *RADIOLOGY REPORT*  Clinical Data: Altered mental status for the past week. Acute renal failure. Uncontrolled diabetes.  CT HEAD WITHOUT CONTRAST  Technique: Contiguous axial images were obtained from the base of the skull through the vertex without contrast.  Comparison: 12/23/2008.  Findings: Stable right frontal ventriculoperitoneal shunt catheter. Mildly progressive enlargement of the ventricles and subarachnoid spaces. A previously demonstrated 1.0 cm rounded high density mass in the left frontal periventricular region measures 1.2 cm in maximum diameter on image #9. The same measurement on 12/23/2008 is 1.3 cm on image number 13. Minimal patchy white matter low density in both cerebral hemispheres. The no intracranial hemorrhage or CT evidence of acute infarction. Mild bilateral ethmoid sinus mucosal thickening. Unremarkable bones.  IMPRESSION:  1. 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 2. Mildly  progressive atrophy. 3. Minimal chronic small vessel white matter ischemic changes in both cerebral hemispheres.  Original Report Authenticated By: Darrol Angel, M.D.   Patient CT Show no acute abnormality.  Shunt is stable.   However patient's symptoms of dystaxia, AMS and urinary incontinence align with a probable diagnosis of Hydrocephalus. Consult with hospitalist who agree to admit the patient for further evaluation and work up.  1. Dehydration   2. Renal insufficiency   3. Urge incontinence       MDM  Patient will be admitted.        Arthor Captain, PA-C 07/31/12 1606

## 2012-07-31 NOTE — ED Notes (Signed)
Pt states that he has not been feeling good the last few days. Says he "has a cold or something". Pt does report productive cough with white sputum. Reports that his legs are sore and weak and they get like that when he does not feel good. Pt denies sob nv. Does c/o that is abdomen hurts. Pt takes bp meds at home but has not taken today's dose. Pt is a&ox4. Pt from home. Pt states that "sometimes I slip". Doesn't say that he falls to the ground but just that he slips.

## 2012-07-31 NOTE — ED Notes (Signed)
Patient blood sugar was 369 notified RN of blood sugar

## 2012-07-31 NOTE — ED Notes (Signed)
IV team at bedside 

## 2012-07-31 NOTE — ED Notes (Signed)
Decreased ability to amb, urinary incont which is a change for him

## 2012-07-31 NOTE — Progress Notes (Addendum)
Disposition Note  Bob Vazquez, is a 71 y.o. male,   MRN: [id number]  -  DOB - 1941-08-11  Outpatient Primary MD for the patient is AVBUERE,EDWIN A, MD   Blood pressure 160/69, pulse 99, resp. rate 20, SpO2 96.00%.   Altered mental status  DM (diabetes mellitus) - uncontrolled  Acute renal failure  NPH (normal pressure hydrocephalus)   Active Problems:  Hypertension  Hyperlipemia     71 yo male with AMS/Confusion for 1 week.  Presented to ED with Urinary incontinence, weakness and cough.  Patient slightly disorientated.  Currently in acute renal failure felt to be from dehydration and uncontrolled DM.  EDP did not order a head CT as his neuro exam was non-focal.    Reportedly his symptoms have improved with hydration.  Patient has a history of NPH with shunt placement 2012 by Dr. Trey Sailors.  May consider consultation based on further evaluation.  I will request a tele bed as the patient has been tachycardic and go eyeball him now.  Algis Downs, PA-C [facility] Pager: [phone]  Addendum:  On my exam the patient appears very stable but has significant Right > Left lower extremity weakness.  I will not order a CT head as the receiving attending may decide to order scans of head and spine.

## 2012-07-31 NOTE — Progress Notes (Signed)
Bob Vazquez [id number] Transfer Data: 07/31/2012 6:43 PM Attending Provider: Altha Harm, MD WUJ:[id number],WGNFA A, MD Code Status: not addressed   Bob Vazquez is a 71 y.o. male patient admitted from ED  No acute distress noted.  No c/o shortness of breath, no c/o chest pain.    Blood pressure 157/78, pulse 95, temperature 99.4 F (37.4 C), temperature source Oral, resp. rate 20, weight 82.9 kg (182 lb 12.2 oz), SpO2 95.00%.   IV Fluids:  IV in place, occlusive dsg intact without redness, IV cath antecubital left, condition patent and no redness normal saline.   Allergies:  Penicillins  Past Medical History:   has a past medical history of Hypertension; Hyperlipemia; NPH (normal pressure hydrocephalus); Chronic bronchitis; Type II diabetes mellitus; and Migraines.  Past Surgical History:   has past surgical history that includes Brain surgery (~ 2007) and Tonsillectomy (~ 1980).  Social History:   reports that he has never smoked. He has never used smokeless tobacco. He reports that he does not drink alcohol or use illicit drugs.  Skin: intact   Orientation to room, and floor completed with information packet given to patient/family. Admission INP armband ID verified with patient/family, and in place.   SR up x 2, fall assessment complete, with patient and family able to verbalize understanding of risk associated with falls, and verbalized understanding to call for assistance before getting out of bed.   Call light within reach. Patient able to voice and demonstrate understanding of unit orientation instructions.   Will cont to eval and treat per MD orders.  Susann Givens, RN, [facility] 07/31/2012 [phone]

## 2012-07-31 NOTE — H&P (Signed)
Hospital Admission Note Date: 07/31/2012  Patient name: Bob Vazquez Medical record number: [id number] Date of birth: 1941/01/24 Age: 71 y.o. Gender: male PCP: Dorrene German, MD  Attending physician: Altha Harm, MD  Chief Complaint: Lower extremity weakness  History of Present Illness: This is a 71 year old gentleman with a history of diabetes who presents the emergency room after approximately one week of progressive weakness of the lower extremities. According to the patient he started noting is having some difficulty with walking approximately one week ago. However in the last 2 days he has had significant weakness of the right lower extremity. The patient also states that he's been having a markedly decreased appetite but no significant vomiting or diarrhea. He had one episode of emesis after drinking something to help with bowel movements. He denies any pain, fever, chest pain, dizziness, frequency, urgency, dysuria, bowel and bladder dysfunction, but does state that he just has not been feeling well.   In the emergency room the patient was noted to have some degree of renal insufficiency. However we do not have baseline labs for this patient and unable to tell whether or not this is the patient's baseline or whether this is a new development for him. He is also noted to have a sodium of 127. However unfortunately the patient has been started on 0.9 normal saline in the emergency room which makes urine laboratory studies uninterpretable for this patient at this  Scheduled Meds:   . banana bag IV fluid 1000 mL   Intravenous Once  . sodium chloride  1,000 mL Intravenous Once   Continuous Infusions:  PRN Meds:.naloxone (NARCAN) injection Allergies: Penicillins Past Medical History  Diagnosis Date  . Hypertension   . Hyperlipemia   . NPH (normal pressure hydrocephalus)   . Chronic bronchitis   . Type II diabetes mellitus   . Migraines     "q now and then"   Past Surgical  History  Procedure Date  . 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 Tonsillectomy ~ 1980   History reviewed. No pertinent family history. History   Social History  . Marital Status: Divorced    Spouse Name: N/A    Number of Children: N/A  . Years of Education: N/A   Occupational History  . Not on file.   Social History Main Topics  . Smoking status: Never Smoker   . Smokeless tobacco: Never Used  . Alcohol Use: No  . Drug Use: No  . Sexually Active: Yes   Other Topics Concern  . Not on file   Social History Narrative  . No narrative on file   Review of Systems: A comprehensive review of systems was negative. Physical Exam: No intake or output data in the 24 hours ending 07/31/12 1649 General: Alert, awake, oriented x3, in no acute distress.  HEENT: [place]/AT PEERL, EOMI Neck: Trachea midline,  no masses, no thyromegal,y no JVD, no carotid bruit OROPHARYNX:  Moist, No exudate/ erythema/lesions.  Heart: Regular rate and rhythm, without murmurs, rubs, gallops.  Lungs: Clear to auscultation. Abdomen: Soft, nontender, nondistended, positive bowel sounds, no masses no hepatosplenomegaly noted..  Neuro: Pt has a distinct difference in strength in BLE's with strength in LLE 3+/5 and RLE 3-/5 Musculoskeletal: No warm swelling or erythema around joints, no spinal tenderness noted. Psychiatric: Patient alert and oriented x3. Rectal Examination: Pt has normal rectal tone.  Lab results:  Basename 07/31/12 0630  NA 127*  K 4.6  CL 90*  CO2  26  GLUCOSE 385*  BUN 24*  CREATININE 1.94*  CALCIUM 8.8  MG --  PHOS --    Basename 07/31/12 0630  AST 55*  ALT 56*  ALKPHOS 140*  BILITOT 0.8  PROT 7.8  ALBUMIN 2.6*   No results found for this basename: LIPASE:2,AMYLASE:2 in the last 72 hours  Basename 07/31/12 0630  WBC 10.3  NEUTROABS 9.0*  HGB 12.7*  HCT 35.6*  MCV 81.5  PLT 288   No results found for this basename: CKTOTAL:3,CKMB:3,CKMBINDEX:3,TROPONINI:3 in the  last 72 hours No components found with this basename: POCBNP:3 No results found for this basename: DDIMER:2 in the last 72 hours No results found for this basename: HGBA1C:2 in the last 72 hours No results found for this basename: CHOL:2,HDL:2,LDLCALC:2,TRIG:2,CHOLHDL:2,LDLDIRECT:2 in the last 72 hours No results found for this basename: TSH,T4TOTAL,FREET3,T3FREE,THYROIDAB in the last 72 hours No results found for this basename: VITAMINB12:2,FOLATE:2,FERRITIN:2,TIBC:2,IRON:2,RETICCTPCT:2 in the last 72 hours Imaging results:  Ct Head Wo Contrast  07/31/2012  *RADIOLOGY REPORT*  Clinical Data: Altered mental status for the past week.  Acute renal failure.  Uncontrolled diabetes.  CT HEAD WITHOUT CONTRAST  Technique:  Contiguous axial images were obtained from the base of the skull through the vertex without contrast.  Comparison: 12/23/2008.  Findings: Stable right frontal ventriculoperitoneal shunt catheter. Mildly progressive enlargement of the ventricles and subarachnoid spaces.  A previously demonstrated 1.0 cm rounded high density mass in the left frontal periventricular region measures 1.2 cm in maximum diameter on image #9.  The same measurement on 12/23/2008 is 1.3 cm on image number 13.  Minimal patchy white matter low density in both cerebral hemispheres.  The no intracranial hemorrhage or CT evidence of acute infarction.  Mild bilateral ethmoid sinus mucosal thickening.  Unremarkable bones.  IMPRESSION:  1.  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 2.  Mildly progressive atrophy. 3.  Minimal chronic small vessel white matter ischemic changes in both cerebral hemispheres.  Original Report Authenticated By: Darrol Angel, M.D.   Dg Chest Portable 1 View  07/31/2012  *RADIOLOGY REPORT*  Clinical Data: Altered mental status.  PORTABLE CHEST - 1 VIEW  Comparison: 01/25/2006  Findings: Mild cardiomegaly.  Left base atelectasis.  Vascular congestion without overt edema.  Right lung is clear.   No effusions or acute bony abnormality.  IMPRESSION: Cardiomegaly with vascular congestion and left base atelectasis.  Original Report Authenticated By: Cyndie Chime, M.D.   Other results:  Patient Active Hospital Problem List:  Acute renal failure (07/31/2012)   Assessment: Patient presents status with renal failure with a GFR of 38. It is unclear as to whether or not this is the patient's baseline creatinine as we have no baseline labs with patient. The patient has been started on IV fluids and will recheck his renal function in the morning to see whether or not there has been improvement. Also also get a spot urine protein creatinine ratio evaluate for intrarenal etiology. In the meantime we will hold both metformin and losartan.    Hyponatremia (07/31/2012)   Assessment: The patient gives a history that could support dehydration, however the chronicity of his hyponatremia is unclear. The patient would benefit from urine studies to unravel etiology of his hyponatremia however the patient was unfortunately started on IV fluids in the emergency room and that Celexa be an interpretable at this time. I will recheck the sodium after the patient has received a substantial amount of IV fluids.   DM (diabetes mellitus) (07/31/2012)  Assessment: Continue Lantus and add SSI correction and meal coverage.   Hypertension ()   Assessment: Will hold Cozaar in light of renal insufficiency and start on Norvasc.     NPH (normal pressure hydrocephalus) ()   Assessment: It is unclear whether the patient's weakness may be related his normal pressure hydrocephalus. A CT scan of the head has been ordered and I will review and make further decisions.    Weakness of right leg (07/31/2012)   Assessment: Will obtain MRI of L-S spine.    Total time for initial evaluation approximately 40 minutes.  Kenitra Leventhal A. 07/31/2012, 4:49 PM

## 2012-08-01 LAB — URINE CULTURE: Culture: NO GROWTH

## 2012-08-01 LAB — CBC
Hemoglobin: 12.1 g/dL — ABNORMAL LOW (ref 13.0–17.0)
MCHC: 34.4 g/dL (ref 30.0–36.0)
RDW: 15.7 % — ABNORMAL HIGH (ref 11.5–15.5)
WBC: 9.2 10*3/uL (ref 4.0–10.5)

## 2012-08-01 LAB — BASIC METABOLIC PANEL
CO2: 22 mEq/L (ref 19–32)
Chloride: 97 mEq/L (ref 96–112)
GFR calc Af Amer: 57 mL/min — ABNORMAL LOW (ref >90)
Potassium: 4.4 mEq/L (ref 3.5–5.1)

## 2012-08-01 LAB — DIFFERENTIAL
Basophils Absolute: 0 10*3/uL (ref 0.0–0.1)
Basophils Relative: 0 % (ref 0–1)
Lymphocytes Relative: 15 % (ref 12–46)
Neutro Abs: 6.5 10*3/uL (ref 1.7–7.7)
Neutrophils Relative %: 71 % (ref 43–77)

## 2012-08-01 LAB — HEMOGLOBIN A1C
Hgb A1c MFr Bld: 9.4 % — ABNORMAL HIGH (ref <5.7)
Mean Plasma Glucose: 223 mg/dL — ABNORMAL HIGH (ref <117)

## 2012-08-01 NOTE — Progress Notes (Signed)
Inpatient Diabetes Program Recommendations  AACE/ADA: New Consensus Statement on Inpatient Glycemic Control (2013)  Target Ranges:  Prepandial:   less than 140 mg/dL      Peak postprandial:   less than 180 mg/dL (1-2 hours)      Critically ill patients:  140 - 180 mg/dL   Reason for Visit: Hyperglycemia and elevated HgbA1C  Inpatient Diabetes Program Recommendations Insulin - Basal: Pt takes Lantus 50 units at home.  Please consider increase at this time to 25 units. HgbA1C: Elevated HgbA1C at 9.4%  Would patient benefit from addition of [place] for home thereapy regimen?  Note: Thank you, Lenor Coffin, RN, CNS, Diabetes Coordinator [phone])

## 2012-08-01 NOTE — Progress Notes (Signed)
[facility] PROGRESS NOTE  Bob Vazquez ION:[id number] DOB: [birth year] DOA: 07/31/2012 PCP: Dorrene German, MD  Assessment/Plan: Active Problems:  Hypertension  Hyperlipemia  Altered mental status  DM (diabetes mellitus)  Acute renal failure  NPH (normal pressure hydrocephalus)  Weakness of right leg  Hyponatremia  RLE weakness: Patient presented with right lower extremity weakness. MRI of the lumbar area shows a small impingement at the area of S1 which is not consistent with dermatomal distribution weakness. I have spoken with Dr. Phoebe Perch from neurosurgery who agrees that this finding on the MRI would not explain the patient's symptoms and lower extremity proximal weakness. I will however start the patient on some low-dose steroids and have him followup with Dr. Phoebe Perch in the office. The right lower extremity weakness however has improved in strength and strength in the right lower extremity today is 3/5.  Acute renal failure: Creatinine has improved from 1.94 down to 1.38 today after administration of IV fluids. I have continued to hold losartan. I will continue his IV hydration and reevaluate renal function tomorrow  Hyponatremia: It appears the hyponatremia may have been associated with a state of volume depletion. Serum sodium has improved from 120 06/23/1932. I will continue IV hydration reevaluate sodium in the morning.  Diabetes type 2: Blood sugars have been elevated above 200. We'll increase the Lantus to 25 units starting in the morning.  Code Status: Full code Family Communication: The patient is fully awake and oriented x3. He has not designated a surrogate to receive information. Disposition Plan: This has not yet been determined. I'm awaiting physical therapy evaluation to make a determination however the patient will most likely be returning to his home with home therapies if necessary.   Bob Vazquez A.  [facility] Pager [phone]. If 8PM-8AM, please  contact night-coverage at [email or web address], password [facility] 08/01/2012, 3:55 PM  LOS: 1 day   Brief narrative: This is a 71 year old gentleman with a history of diabetes who presents the emergency room after approximately one week of progressive weakness of the lower extremities. According to the patient he started noting is having some difficulty with walking approximately one week ago. However in the last 2 days he has had significant weakness of the right lower extremity. The patient also states that he's been having a markedly decreased appetite but no significant vomiting or diarrhea. He had one episode of emesis after drinking something to help with bowel movements. He denies any pain, fever, chest pain, dizziness, frequency, urgency, dysuria, bowel and bladder dysfunction, but does state that he just has not been feeling well.  In the emergency room the patient was noted to have some degree of renal insufficiency. However we do not have baseline labs for this patient and unable to tell whether or not this is the patient's baseline or whether this is a new development for him. He is also noted to have a sodium of 127.   Consultants:  Dr. Rachell Cipro consultation  Procedures:  None  Antibiotics:  None  HPI/Subjective: Patient states that he feels better today. He feels that the right lower extremity is stronger and is wanting to get up and walk. He does admit to occasional alcohol use.  Objective: Filed Vitals:   07/31/12 2010 07/31/12 2035 08/01/12 0554 08/01/12 1305  BP: 149/75  155/82 150/76  Pulse: 94  94 107  Temp: 99.1 F (37.3 C)  99.5 F (37.5 C) 99.4 F (37.4 C)  TempSrc: Oral  Oral Oral  Resp: 16  20 20  Height:  5\' 11"  (1.803 m)    Weight:      SpO2: 96%  97% 96%   Weight change:   Intake/Output Summary (Last 24 hours) at 08/01/12 1555 Last data filed at 08/01/12 1300  Gross per 24 hour  Intake   1548 ml  Output   1150 ml  Net    398 ml   General: Alert, awake,  oriented x3, in no acute distress.  HEENT: [place]/AT PEERL, EOMI  Neck: Trachea midline, no masses, no thyromegal,y no JVD, no carotid bruit  OROPHARYNX: Moist, No exudate/ erythema/lesions.  Heart: Regular rate and rhythm, without murmurs, rubs, gallops.  Lungs: Clear to auscultation.  Abdomen: Soft, nontender, nondistended, positive bowel sounds, no masses no hepatosplenomegaly noted..  Neuro: Pt has a distinct difference in strength in BLE's with strength in LLE 3+/5 and RLE 3/5  Musculoskeletal: No warm swelling or erythema around joints, no spinal tenderness noted.  Psychiatric: Patient alert and oriented x3.      Data Reviewed: Basic Metabolic Panel:  Lab 08/01/12 9604 07/31/12 1720 07/31/12 0630  NA 131* -- 127*  K 4.4 -- 4.6  CL 97 -- 90*  CO2 22 -- 26  GLUCOSE 208* -- 385*  BUN 19 -- 24*  CREATININE 1.38* 1.58* 1.94*  CALCIUM 8.3* -- 8.8  MG -- -- --  PHOS -- -- --   Liver Function Tests:  Lab 07/31/12 0630  AST 55*  ALT 56*  ALKPHOS 140*  BILITOT 0.8  PROT 7.8  ALBUMIN 2.6*   No results found for this basename: LIPASE:5,AMYLASE:5 in the last 168 hours No results found for this basename: AMMONIA:5 in the last 168 hours CBC:  Lab 08/01/12 0541 07/31/12 1720 07/31/12 0630  WBC 9.2 8.1 10.3  NEUTROABS 6.5 -- 9.0*  HGB 12.1* 12.0* 12.7*  HCT 35.2* 34.4* 35.6*  MCV 83.0 82.7 81.5  PLT 308 285 288   Cardiac Enzymes: No results found for this basename: CKTOTAL:5,CKMB:5,CKMBINDEX:5,TROPONINI:5 in the last 168 hours BNP (last 3 results) No results found for this basename: PROBNP:3 in the last 8760 hours CBG:  Lab 08/01/12 1204 08/01/12 0734 07/31/12 2138 07/31/12 1736 07/31/12 0751  GLUCAP 187* 203* 263* 254* 369*    Recent Results (from the past 240 hour(s))  URINE CULTURE     Status: Normal   Collection Time   07/31/12  9:04 AM      Component Value Range Status Comment   Specimen Description URINE, CLEAN CATCH   Final    Special Requests NONE   Final     Culture  Setup Time 07/31/2012 09:55   Final    Colony Count NO GROWTH   Final    Culture NO GROWTH   Final    Report Status 08/01/2012 FINAL   Final      Studies: Ct Head Wo Contrast  07/31/2012  *RADIOLOGY REPORT*  Clinical Data: Altered mental status for the past week.  Acute renal failure.  Uncontrolled diabetes.  CT HEAD WITHOUT CONTRAST  Technique:  Contiguous axial images were obtained from the base of the skull through the vertex without contrast.  Comparison: 12/23/2008.  Findings: Stable right frontal ventriculoperitoneal shunt catheter. Mildly progressive enlargement of the ventricles and subarachnoid spaces.  A previously demonstrated 1.0 cm rounded high density mass in the left frontal periventricular region measures 1.2 cm in maximum diameter on image #9.  The same measurement on 12/23/2008 is 1.3 cm on image number 13.  Minimal patchy white matter low density  in both cerebral hemispheres.  The no intracranial hemorrhage or CT evidence of acute infarction.  Mild bilateral ethmoid sinus mucosal thickening.  Unremarkable bones.  IMPRESSION:  1.  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 2.  Mildly progressive atrophy. 3.  Minimal chronic small vessel white matter ischemic changes in both cerebral hemispheres.  Original Report Authenticated By: Darrol Angel, M.D.   Mr Lumbar Spine W Wo Contrast  08/01/2012  *RADIOLOGY REPORT*  Clinical Data: Right lower extremity weakness.  MRI LUMBAR SPINE WITHOUT AND WITH CONTRAST  Technique:  Multiplanar and multiecho pulse sequences of the lumbar spine were obtained without and with intravenous contrast.  Contrast: 18mL MULTIHANCE GADOBENATE DIMEGLUMINE 529 MG/ML IV SOLN  Comparison: 01/26/2006  Findings: Scan extends from T11-12 through S4.  Tip of the conus is at L1-2 and appears normal.  Normal paraspinal soft tissues.  The T11-12 through L3-4:.  04/05:  Disc is normal.  Slight bilateral facet arthritis.  L5-S1:  Focal small soft disc protrusion  to the right of midline touching the right S1 nerve root sleeve.  This might cause right S1 radiculopathy.  Slight degenerative changes of the right facet joint.  There is slight enhancement of the facet joints at L3-4 and L4-5 after contrast administration.  IMPRESSION: Small focal soft disc protrusion at L5 S1 touching the right S1 nerve root sleeve.  Slight degenerative facet arthritis in the lower lumbar spine.  Original Report Authenticated By: Gwynn Burly, M.D.   Dg Chest Portable 1 View  07/31/2012  *RADIOLOGY REPORT*  Clinical Data: Altered mental status.  PORTABLE CHEST - 1 VIEW  Comparison: 01/25/2006  Findings: Mild cardiomegaly.  Left base atelectasis.  Vascular congestion without overt edema.  Right lung is clear.  No effusions or acute bony abnormality.  IMPRESSION: Cardiomegaly with vascular congestion and left base atelectasis.  Original Report Authenticated By: Cyndie Chime, M.D.    Scheduled Meds:   . sodium chloride   Intravenous STAT  . amLODipine  5 mg Oral Daily  . enoxaparin (LOVENOX) injection  40 mg Subcutaneous Q24H  . gadobenate dimeglumine  18 mL Intravenous Once  . insulin aspart  0-15 Units Subcutaneous TID WC  . insulin aspart  0-5 Units Subcutaneous QHS  . insulin aspart  3 Units Subcutaneous TID WC  . insulin glargine  15 Units Subcutaneous Daily  . pneumococcal 23 valent vaccine  0.5 mL Intramuscular Tomorrow-1000   Continuous Infusions:   . sodium chloride 100 mL/hr at 07/31/12 1725    Active Problems:  Hypertension  Hyperlipemia  Altered mental status  DM (diabetes mellitus)  Acute renal failure  NPH (normal pressure hydrocephalus)  Weakness of right leg  Hyponatremia

## 2012-08-02 DIAGNOSIS — E871 Hypo-osmolality and hyponatremia: Secondary | ICD-10-CM

## 2012-08-02 LAB — GLUCOSE, CAPILLARY
Glucose-Capillary: 148 mg/dL — ABNORMAL HIGH (ref 70–99)
Glucose-Capillary: 181 mg/dL — ABNORMAL HIGH (ref 70–99)
Glucose-Capillary: 160 mg/dL — ABNORMAL HIGH (ref 70–99)
Glucose-Capillary: 224 mg/dL — ABNORMAL HIGH (ref 70–99)
Glucose-Capillary: 146 mg/dL — ABNORMAL HIGH (ref 70–99)

## 2012-08-02 LAB — BASIC METABOLIC PANEL
CO2: 27 mEq/L (ref 19–32)
Calcium: 8.3 mg/dL — ABNORMAL LOW (ref 8.4–10.5)
Creatinine, Ser: 1.39 mg/dL — ABNORMAL HIGH (ref 0.50–1.35)
GFR calc Af Amer: 56 mL/min — ABNORMAL LOW (ref >90)
GFR calc non Af Amer: 49 mL/min — ABNORMAL LOW (ref >90)
Sodium: 132 mEq/L — ABNORMAL LOW (ref 135–145)

## 2012-08-02 MED ORDER — SENNOSIDES-DOCUSATE SODIUM 8.6-50 MG PO TABS
2.0000 | ORAL_TABLET | Freq: Once | ORAL | Status: AC
  Administered 2012-08-02: 2 via ORAL
  Filled 2012-08-02: qty 2

## 2012-08-02 MED ORDER — POLYETHYLENE GLYCOL 3350 17 G PO PACK
17.0000 g | PACK | Freq: Every day | ORAL | Status: DC
  Administered 2012-08-02 – 2012-08-03 (×2): 17 g via ORAL
  Filled 2012-08-02 (×2): qty 1

## 2012-08-02 MED ORDER — INSULIN GLARGINE 100 UNIT/ML ~~LOC~~ SOLN
25.0000 [IU] | Freq: Every day | SUBCUTANEOUS | Status: DC
  Administered 2012-08-03: 25 [IU] via SUBCUTANEOUS

## 2012-08-02 NOTE — Progress Notes (Signed)
[facility] PROGRESS NOTE  Bob Vazquez RUE:[id number] DOB: 02/22/1941 DOA: 07/31/2012 PCP: Dorrene German, MD  Assessment/Plan: Active Problems:  Hypertension  Hyperlipemia  Altered mental status  DM (diabetes mellitus)  Acute renal failure  NPH (normal pressure hydrocephalus)  Weakness of right leg  Hyponatremia  RLE weakness: Essentially resolved and back to normal. The patient however complains of pain in the right calf and I will obtain a duplex to rule out DVT in that leg.  Acute renal failure: Creatinine appears to have plateaued at 1.38-1.39. I suspect this is likely the patient's baseline we'll DC IV fluids as the patient is taking adequate urine output at this time.  Hyponatremia: Sodium has improved with IV fluid and the patient had concurrent improvement in his strength.  Diabetes type 2: Blood sugars have been elevated above 200. We'll increase the Lantus to 25 units starting in the morning.  Code Status: Full code Family Communication: The patient is fully awake and oriented x3. He has not designated a surrogate to receive information. Disposition Plan: This has not yet been determined. I'm awaiting physical therapy evaluation to make a determination however the patient will most likely be returning to his home with home therapies if necessary.   Nazarene Bunning A.  [facility] Pager [phone]. If 8PM-8AM, please contact night-coverage at [email or web address], password [facility] 08/02/2012, 6:39 PM  LOS: 2 days   Brief narrative: This is a 71 year old gentleman with a history of diabetes who presents the emergency room after approximately one week of progressive weakness of the lower extremities. According to the patient he started noting is having some difficulty with walking approximately one week ago. However in the last 2 days he has had significant weakness of the right lower extremity. The patient also states that he's been having a markedly decreased appetite  but no significant vomiting or diarrhea. He had one episode of emesis after drinking something to help with bowel movements. He denies any pain, fever, chest pain, dizziness, frequency, urgency, dysuria, bowel and bladder dysfunction, but does state that he just has not been feeling well.  In the emergency room the patient was noted to have some degree of renal insufficiency. However we do not have baseline labs for this patient and unable to tell whether or not this is the patient's baseline or whether this is a new development for him. He is also noted to have a sodium of 127.   Consultants:  Dr. Rachell Cipro consultation  Procedures:  None  Antibiotics:  None  HPI/Subjective: Patient states that he feels better today. He feels that the right lower extremity is stronger and is wanting to get up and walk. He does admit to occasional alcohol use.  Objective: Filed Vitals:   08/01/12 1305 08/01/12 2041 08/02/12 0538 08/02/12 1500  BP: 150/76 130/73 135/73 135/74  Pulse: 107 97 95 88  Temp: 99.4 F (37.4 C) 99.5 F (37.5 C) 99.3 F (37.4 C) 98 F (36.7 C)  TempSrc: Oral Oral Oral Oral  Resp: 20 18 20 18   Height:      Weight:      SpO2: 96% 97% 93% 98%   Weight change:   Intake/Output Summary (Last 24 hours) at 08/02/12 1839 Last data filed at 08/02/12 1800  Gross per 24 hour  Intake   3120 ml  Output   1125 ml  Net   1995 ml   General: Alert, awake, oriented x3, in no acute distress.  OROPHARYNX: Moist, No exudate/ erythema/lesions.  Heart: Regular  rate and rhythm, without murmurs, rubs, gallops.  Lungs: Clear to auscultation.  Abdomen: Soft, nontender, nondistended, positive bowel sounds, no masses no hepatosplenomegaly noted..  Neuro: Pt has a distinct difference in strength in BLE's with strength in LLE 3+/5 and RLE 3/5  Musculoskeletal: No warm swelling or erythema around joints, no spinal tenderness noted.       Data Reviewed: Basic Metabolic Panel:  Lab  08/02/12 0954 08/01/12 0541 07/31/12 1720 07/31/12 0630  NA 132* 131* -- 127*  K 4.2 4.4 -- 4.6  CL 97 97 -- 90*  CO2 27 22 -- 26  GLUCOSE 232* 208* -- 385*  BUN 17 19 -- 24*  CREATININE 1.39* 1.38* 1.58* 1.94*  CALCIUM 8.3* 8.3* -- 8.8  MG -- -- -- --  PHOS -- -- -- --   Liver Function Tests:  Lab 07/31/12 0630  AST 55*  ALT 56*  ALKPHOS 140*  BILITOT 0.8  PROT 7.8  ALBUMIN 2.6*   No results found for this basename: LIPASE:5,AMYLASE:5 in the last 168 hours No results found for this basename: AMMONIA:5 in the last 168 hours CBC:  Lab 08/01/12 0541 07/31/12 1720 07/31/12 0630  WBC 9.2 8.1 10.3  NEUTROABS 6.5 -- 9.0*  HGB 12.1* 12.0* 12.7*  HCT 35.2* 34.4* 35.6*  MCV 83.0 82.7 81.5  PLT 308 285 288   Cardiac Enzymes: No results found for this basename: CKTOTAL:5,CKMB:5,CKMBINDEX:5,TROPONINI:5 in the last 168 hours BNP (last 3 results) No results found for this basename: PROBNP:3 in the last 8760 hours CBG:  Lab 08/02/12 1705 08/02/12 1208 08/02/12 0743 08/01/12 2202 08/01/12 1648  GLUCAP 224* 160* 181* 148* 183*    Recent Results (from the past 240 hour(s))  URINE CULTURE     Status: Normal   Collection Time   07/31/12  9:04 AM      Component Value Range Status Comment   Specimen Description URINE, CLEAN CATCH   Final    Special Requests NONE   Final    Culture  Setup Time 07/31/2012 09:55   Final    Colony Count NO GROWTH   Final    Culture NO GROWTH   Final    Report Status 08/01/2012 FINAL   Final      Studies: Ct Head Wo Contrast  07/31/2012  *RADIOLOGY REPORT*  Clinical Data: Altered mental status for the past week.  Acute renal failure.  Uncontrolled diabetes.  CT HEAD WITHOUT CONTRAST  Technique:  Contiguous axial images were obtained from the base of the skull through the vertex without contrast.  Comparison: 12/23/2008.  Findings: Stable right frontal ventriculoperitoneal shunt catheter. Mildly progressive enlargement of the ventricles and subarachnoid  spaces.  A previously demonstrated 1.0 cm rounded high density mass in the left frontal periventricular region measures 1.2 cm in maximum diameter on image #9.  The same measurement on 12/23/2008 is 1.3 cm on image number 13.  Minimal patchy white matter low density in both cerebral hemispheres.  The no intracranial hemorrhage or CT evidence of acute infarction.  Mild bilateral ethmoid sinus mucosal thickening.  Unremarkable bones.  IMPRESSION:  1.  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 2.  Mildly progressive atrophy. 3.  Minimal chronic small vessel white matter ischemic changes in both cerebral hemispheres.  Original Report Authenticated By: Darrol Angel, M.D.   Mr Lumbar Spine W Wo Contrast  08/01/2012  *RADIOLOGY REPORT*  Clinical Data: Right lower extremity weakness.  MRI LUMBAR SPINE WITHOUT AND WITH CONTRAST  Technique:  Multiplanar and multiecho pulse sequences of the lumbar spine were obtained without and with intravenous contrast.  Contrast: 18mL MULTIHANCE GADOBENATE DIMEGLUMINE 529 MG/ML IV SOLN  Comparison: 01/26/2006  Findings: Scan extends from T11-12 through S4.  Tip of the conus is at L1-2 and appears normal.  Normal paraspinal soft tissues.  The T11-12 through L3-4:.  04/05:  Disc is normal.  Slight bilateral facet arthritis.  L5-S1:  Focal small soft disc protrusion to the right of midline touching the right S1 nerve root sleeve.  This might cause right S1 radiculopathy.  Slight degenerative changes of the right facet joint.  There is slight enhancement of the facet joints at L3-4 and L4-5 after contrast administration.  IMPRESSION: Small focal soft disc protrusion at L5 S1 touching the right S1 nerve root sleeve.  Slight degenerative facet arthritis in the lower lumbar spine.  Original Report Authenticated By: Gwynn Burly, M.D.   Dg Chest Portable 1 View  07/31/2012  *RADIOLOGY REPORT*  Clinical Data: Altered mental status.  PORTABLE CHEST - 1 VIEW  Comparison: 01/25/2006   Findings: Mild cardiomegaly.  Left base atelectasis.  Vascular congestion without overt edema.  Right lung is clear.  No effusions or acute bony abnormality.  IMPRESSION: Cardiomegaly with vascular congestion and left base atelectasis.  Original Report Authenticated By: Cyndie Chime, M.D.    Scheduled Meds:    . amLODipine  5 mg Oral Daily  . enoxaparin (LOVENOX) injection  40 mg Subcutaneous Q24H  . insulin aspart  0-15 Units Subcutaneous TID WC  . insulin aspart  0-5 Units Subcutaneous QHS  . insulin aspart  3 Units Subcutaneous TID WC  . insulin glargine  15 Units Subcutaneous Daily  . polyethylene glycol  17 g Oral Daily   Continuous Infusions:    . sodium chloride 100 mL/hr at 08/01/12 2300    Principal Problem:  *Weakness of right leg Active Problems:  Hypertension  Hyperlipemia  Altered mental status  DM (diabetes mellitus)  Acute renal failure  NPH (normal pressure hydrocephalus)  Hyponatremia

## 2012-08-02 NOTE — Progress Notes (Signed)
Patient asked for a PO laxative. Patient was told that Miralax was already in place and that he had already taken the first dose. Patient still would like a PO order. Provider on call was notified and an order for senna was ordered. Marcelyn Bruins. RN BSN

## 2012-08-02 NOTE — Evaluation (Signed)
Physical Therapy Evaluation Patient Details Name: Bob Vazquez MRN: [id number] DOB: 02-Nov-1941 Today's Date: 08/02/2012 Time: 1324-4010 PT Time Calculation (min): 22 min  PT Assessment / Plan / Recommendation Clinical Impression  Patient s/p weakness of right LE, HTN and AMS with decr mobility secondary to decr strength and incr pain right LE.  Will need to use a RW at home initially as well as receive HHPT.  Dr. Ashley Royalty notified of right calf pain and plans to assess and address as needed.      PT Assessment  Patient needs continued PT services    Follow Up Recommendations  Home health PT;Supervision - Intermittent    Barriers to Discharge Decreased caregiver support      Equipment Recommendations  Rolling walker with 5" wheels    Recommendations for Other Services     Frequency Min 3X/week    Precautions / Restrictions Precautions Precautions: Fall Restrictions Weight Bearing Restrictions: No   Pertinent Vitals/Pain VSS, right LE pain      Mobility  Bed Mobility Bed Mobility: Not assessed Transfers Transfers: Sit to Stand;Stand to Sit Sit to Stand: 4: Min assist;With upper extremity assist;With armrests;From chair/3-in-1 Stand to Sit: 4: Min assist;With upper extremity assist;With armrests;To chair/3-in-1 Details for Transfer Assistance: Patient in low recliner and needed some assist for sit to stand.  Assist to control descent into chair as well.   Ambulation/Gait Ambulation/Gait Assistance: 4: Min assist Ambulation Distance (Feet): 150 Feet Assistive device: Rolling walker Ambulation/Gait Assistance Details: Slow with gait secondary to c/o right LE pain.   Gait Pattern: Antalgic;Shuffle;Decreased stride length;Step-to pattern;Trunk flexed Gait velocity: decreased Stairs: No Wheelchair Mobility Wheelchair Mobility: No    Exercises General Exercises - Lower Extremity Long Arc Quad: AROM;Both;5 reps;Seated   PT Diagnosis: Generalized weakness  PT Problem  List: Decreased activity tolerance;Decreased balance;Decreased mobility;Decreased safety awareness;Decreased knowledge of use of DME;Pain PT Treatment Interventions: Gait training;DME instruction;Functional mobility training;Therapeutic activities;Therapeutic exercise;Balance training;Patient/family education   PT Goals Acute Rehab PT Goals PT Goal Formulation: With patient Time For Goal Achievement: 08/09/12 Potential to Achieve Goals: Good Pt will go Supine/Side to Sit: Independently PT Goal: Supine/Side to Sit - Progress: Goal set today Pt will go Sit to Stand: Independently;with upper extremity assist PT Goal: Sit to Stand - Progress: Goal set today Pt will Ambulate: 51 - 150 feet;with modified independence;with least restrictive assistive device PT Goal: Ambulate - Progress: Goal set today  Visit Information  Last PT Received On: 08/02/12 Assistance Needed: +1    Subjective Data  Subjective: "I feel so weak." Patient Stated Goal: To go home   Prior Functioning  Home Living Lives With:  (brother checks on him everyday) Available Help at Discharge: Family Type of Home: House Home Access: Level entry Home Layout: One level Bathroom Shower/Tub: Engineer, manufacturing systems: Standard Home Adaptive Equipment: Environmental consultant - standard;Straight cane Prior Function Level of Independence: Independent Able to Take Stairs?: Yes Driving: Yes Vocation: Retired Musician: No difficulties Dominant Hand: Right    Cognition  Overall Cognitive Status: Appears within functional limits for tasks assessed/performed Arousal/Alertness: Awake/alert Orientation Level: Appears intact for tasks assessed Behavior During Session: [facility] for tasks performed    Extremity/Trunk Assessment Right Upper Extremity Assessment RUE ROM/Strength/Tone: WFL for tasks assessed RUE Sensation: WFL - Light Touch RUE Coordination: WFL - gross/fine motor Left Upper Extremity Assessment LUE  ROM/Strength/Tone: WFL for tasks assessed LUE Sensation: WFL - Light Touch LUE Coordination: WFL - gross/fine motor Right Lower Extremity Assessment RLE ROM/Strength/Tone: [facility] for  tasks assessed RLE Sensation: WFL - Light Touch RLE Coordination: WFL - gross/fine motor Left Lower Extremity Assessment LLE ROM/Strength/Tone: WFL for tasks assessed LLE Sensation: WFL - Light Touch LLE Coordination: WFL - gross/fine motor   Balance    End of Session PT - End of Session Equipment Utilized During Treatment: Gait belt Activity Tolerance: Patient tolerated treatment well Patient left: in chair;with call bell/phone within reach Nurse Communication: Mobility status       INGOLD,Phala Schraeder 08/02/2012, 2:30 PM  [facility] Acute Rehabilitation [phone] (pager)

## 2012-08-03 DIAGNOSIS — R269 Unspecified abnormalities of gait and mobility: Secondary | ICD-10-CM

## 2012-08-03 LAB — BASIC METABOLIC PANEL
BUN: 16 mg/dL (ref 6–23)
Calcium: 8.4 mg/dL (ref 8.4–10.5)
Creatinine, Ser: 1.3 mg/dL (ref 0.50–1.35)
GFR calc Af Amer: 61 mL/min — ABNORMAL LOW (ref >90)
GFR calc non Af Amer: 53 mL/min — ABNORMAL LOW (ref >90)

## 2012-08-03 MED ORDER — GLIMEPIRIDE 1 MG PO TABS: 1.0000 mg | ORAL_TABLET | Freq: Every day | ORAL | Status: DC

## 2012-08-03 MED ORDER — INSULIN GLARGINE 100 UNIT/ML ~~LOC~~ SOLN: 25.0000 [IU] | Freq: Every day | SUBCUTANEOUS | Status: DC

## 2012-08-03 MED ORDER — AMLODIPINE BESYLATE 5 MG PO TABS: 5.0000 mg | ORAL_TABLET | Freq: Every day | ORAL | Status: DC

## 2012-08-03 NOTE — Progress Notes (Signed)
   CARE MANAGEMENT NOTE 08/03/2012  Patient:  MONTARIO, ZILKA   Account Number:  [id number]  Date Initiated:  08/03/2012  Documentation initiated by:  [facility]  Subjective/Objective Assessment:   HTN, AMS     Action/Plan:   Anticipated DC Date:  08/03/2012   Anticipated DC Plan:  HOME W HOME HEALTH SERVICES      DC Planning Services  CM consult      [facility] Choice  HOME HEALTH   Choice offered to / List presented to:  C-1 Patient   DME arranged  Levan Hurst      DME agency  [facility]     HH arranged  HH-2 PT      [facility] agency  [facility]   Status of service:  Completed, signed off Medicare Important Message given?   (If response is "NO", the following Medicare IM given date fields will be blank) Date Medicare IM given:   Date Additional Medicare IM given:    Discharge Disposition:  HOME W HOME HEALTH SERVICES  Per UR Regulation:    If discussed at Long Length of Stay Meetings, dates discussed:    Comments:  08/03/2012 1600 Spoke to pt and requested [facility] for [facility]. Contacted [facility] for RW to be delivered to hospital prior to d/c. Referral for [facility] PT entered in TLC. Isidoro Donning RN CCM Case Mgmt phone [phone]

## 2012-08-03 NOTE — Progress Notes (Signed)
08/03/12 Patient to be discharged home today. IV site removed. Discharge instructions reviewed with patient and given to pt. Mekiah Cambridge,RN.

## 2012-08-03 NOTE — Discharge Summary (Signed)
Bob Vazquez MRN: [id number] DOB/AGE: 1941/05/08 71 y.o.  Admit date: 07/31/2012 Discharge date: 08/03/2012  Primary Care Physician:  Dorrene German, MD   Discharge Diagnoses:   Patient Active Problem List  Diagnosis  . Hypertension  . Hyperlipemia  . Altered mental status  . DM (diabetes mellitus)  . Acute renal failure  . NPH (normal pressure hydrocephalus)  . Weakness of right leg  . Hyponatremia    DISCHARGE MEDICATION: Medication List  As of 08/03/2012 11:58 AM   STOP taking these medications         losartan 25 MG tablet         TAKE these medications         Amlodipine 5 MG tablet   Commonly known as: NORVASC   Take 1 tablet (5 mg total) by mouth daily.      Glimepiride 1 MG tablet   Commonly known as AMARYL   Take 1 tablet (1 mg total) by mouth daily.   insulin glargine 100 UNIT/ML injection   Commonly known as: LANTUS   Inject 25 Units into the skin daily with breakfast.      metFORMIN 500 MG tablet   Commonly known as: GLUCOPHAGE   Take 500 mg by mouth 2 (two) times daily with a meal.             SIGNIFICANT DIAGNOSTIC STUDIES:  Ct Head Wo Contrast  07/31/2012  *RADIOLOGY REPORT*  Clinical Data: Altered mental status for the past week.  Acute renal failure.  Uncontrolled diabetes.  CT HEAD WITHOUT CONTRAST  Technique:  Contiguous axial images were obtained from the base of the skull through the vertex without contrast.  Comparison: 12/23/2008.  Findings: Stable right frontal ventriculoperitoneal shunt catheter. Mildly progressive enlargement of the ventricles and subarachnoid spaces.  A previously demonstrated 1.0 cm rounded high density mass in the left frontal periventricular region measures 1.2 cm in maximum diameter on image #9.  The same measurement on 12/23/2008 is 1.3 cm on image number 13.  Minimal patchy white matter low density in both cerebral hemispheres.  The no intracranial hemorrhage or CT evidence of acute infarction.  Mild bilateral  ethmoid sinus mucosal thickening.  Unremarkable bones.  IMPRESSION:  1.  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 2.  Mildly progressive atrophy. 3.  Minimal chronic small vessel white matter ischemic changes in both cerebral hemispheres.  Original Report Authenticated By: Darrol Angel, M.D.   Mr Lumbar Spine W Wo Contrast  08/01/2012  *RADIOLOGY REPORT*  Clinical Data: Right lower extremity weakness.  MRI LUMBAR SPINE WITHOUT AND WITH CONTRAST  Technique:  Multiplanar and multiecho pulse sequences of the lumbar spine were obtained without and with intravenous contrast.  Contrast: 18mL MULTIHANCE GADOBENATE DIMEGLUMINE 529 MG/ML IV SOLN  Comparison: 01/26/2006  Findings: Scan extends from T11-12 through S4.  Tip of the conus is at L1-2 and appears normal.  Normal paraspinal soft tissues.  The T11-12 through L3-4:.  04/05:  Disc is normal.  Slight bilateral facet arthritis.  L5-S1:  Focal small soft disc protrusion to the right of midline touching the right S1 nerve root sleeve.  This might cause right S1 radiculopathy.  Slight degenerative changes of the right facet joint.  There is slight enhancement of the facet joints at L3-4 and L4-5 after contrast administration.  IMPRESSION: Small focal soft disc protrusion at L5 S1 touching the right S1 nerve root sleeve.  Slight degenerative facet arthritis in the lower lumbar spine.  Original  Report Authenticated By: Gwynn Burly, M.D.   Dg Chest Portable 1 View  07/31/2012  *RADIOLOGY REPORT*  Clinical Data: Altered mental status.  PORTABLE CHEST - 1 VIEW  Comparison: 01/25/2006  Findings: Mild cardiomegaly.  Left base atelectasis.  Vascular congestion without overt edema.  Right lung is clear.  No effusions or acute bony abnormality.  IMPRESSION: Cardiomegaly with vascular congestion and left base atelectasis.  Original Report Authenticated By: Cyndie Chime, M.D.     Recent Results (from the past 240 hour(s))  URINE CULTURE     Status: Normal    Collection Time   07/31/12  9:04 AM      Component Value Range Status Comment   Specimen Description URINE, CLEAN CATCH   Final    Special Requests NONE   Final    Culture  Setup Time 07/31/2012 09:55   Final    Colony Count NO GROWTH   Final    Culture NO GROWTH   Final    Report Status 08/01/2012 FINAL   Final     BRIEF ADMITTING H & P: This is a 71 year old gentleman with a history of diabetes who presents the emergency room after approximately one week of progressive weakness of the lower extremities. According to the patient he started noting is having some difficulty with walking approximately one week ago. However in the last 2 days he has had significant weakness of the right lower extremity. The patient also states that he's been having a markedly decreased appetite but no significant vomiting or diarrhea. He had one episode of emesis after drinking something to help with bowel movements. He denies any pain, fever, chest pain, dizziness, frequency, urgency, dysuria, bowel and bladder dysfunction, but does state that he just has not been feeling well.   In the emergency room the patient was noted to have some degree of renal insufficiency. However we do not have baseline labs for this patient and unable to tell whether or not this is the patient's baseline or whether this is a new development for him. He is also noted to have a sodium of 127. However unfortunately the patient has been started on 0.9 normal saline in the emergency room which makes urine laboratory studies uninterpretable for this patient at this    Hospital Course:  Present on Admission:  .Acute renal failure: Patient presents to the emergency room in a state of acute renal failure. Initially he had a creatinine of 1.94. It was unclear as to the patient's baseline creatinine. However he was treated with IV fluids and his ACE receptor blocker was withheld. The patient's creatinine returned to normal with a creatinine of the  time of discharge of 1.31. I have discontinued the ACE receptor blocker and substituted Norvasc for blood pressure control. Additionally the hydrochlorothiazide has been discontinued. The possibility of restarting an ACE inhibitor ACE receptor blocker for control of blood pressure and renovascular protection still exists however renal function should be followed very closely when initially restarted.   Marland KitchenHyponatremia: The patient presented with a serum sodium in the 127. It appears that he may have been having some confusion with the low sodium. He was started on IV fluids in the emergency room thus we were unable to get any urine studies for evaluation of his hyponatremia. However with IV fluids his sodium improved. Time of discharge the patient has a serum sodium of 133. I would recommend avoiding diuretics as much as possible in this patient. Additionally the patient had been  drinking alcohol with his brother and this may have contributed to mild dehydrated state with concurrent decrease in serum sodium.  . Mild dehydration: The patient was mildly dehydrated both by physical examination and also reflected in the labs with the decreased serum sodium and chloride. He improved with IV fluids and is now able to maintain his hydration and nutrition without artificial means.  .Gait abnormality: At presentation the patient had a gait abnormality that was mostly caused by problems with balance as well as weakness in the right lower extremity. Weakness in the white to extremity improved. The patient is evaluated by physical therapy is recommended home health therapy as well as the use of a rolling walker.   .Weakness of right leg: The patient had a noticeable objective weakness in the right lower extremity on admission. There was a concern that this may have been caused by impingement in the lumbosacral region. An MRI of the lumbosacral spine and soft tissue was obtained. The MRI did show some small focal soft disc  protrusion at L5 and S1 touching the right S1 nerve root sleeve. I discussed this with Dr. Phoebe Perch from neurosurgery who felt that this could not account for any weakness in the right lower extremity. He recommended just observation at this time and to followup in the outpatient setting if the patient did not have resolution of the right lower extremity weakness. At the time of discharge the patient has equal strength in the bilateral lower extremities and the weakness that was initially in present on admission is now resolved.   .DM (diabetes mellitus): The patient was initially on metformin and Lantus as an outpatient. A hemoglobin A1c of 9.4 reflects poor long-term control of his diabetes. On admission his metformin was held in anticipation of any contrast studies that were needed and also because of the patient's acute renal failure. At the time of discharge the patient's renal function is back to normal and his metformin will be restarted. However I do leave that the patient is on too high a dose of Lantus as he was on 50 units daily. This has been decreased to 25 units of Lantus daily. I would recommend that the patient be on Amaryl 1 mg by mouth daily for meal coverage. He should followup with his primary care physician within 3-5 days for further titration of his medications.   .Hypertension: The patient was on Cozaar 25 mg daily for control of blood pressure. In light of his acute renal failure the Cozaar was discontinued and Norvasc 5 the substituted for better blood pressure control. This will need to be titrated further as an outpatient. Please see above for recommendations on assumptions of an ACE inhibitor or ACE receptor blocker  . right lower extremity pain: The patient complained of right lower extremity calf pain on yesterday. On examination there was no redness swelling or cording in the calf. There was some concern about whether or not this patient may have a DVT in the right lower extremity  however clinical course is not consistent with a DVT in the duplex ultrasound has been canceled.  Disposition and Follow-up:  Patient's esophagus primary care physician in 3-5 days. Discharge Orders    Future Orders Please Complete By Expires   Diet Carb Modified      Activity as tolerated - No restrictions      Home Health      Questions: Responses:   To provide the following care/treatments PT   Face-to-face encounter  Comments:   I Rodney Wigger A. certify that this patient is under my care and that I, or a nurse practitioner or physician's assistant working with me, had a face-to-face encounter that meets the physician face-to-face encounter requirements with this patient on 08/03/2012.   Questions: Responses:   The encounter with the patient was in whole, or in part, for the following medical condition, which is the primary reason for home health care gait abnormality   I certify that, based on my findings, the following services are medically necessary home health services Physical therapy   My clinical findings support the need for the above services High Risk for rehospitalization   Further, I certify that my clinical findings support that this patient is homebound due to: Unsafe ambulation due to balance issues   To provide the following care/treatments PT     DISCHARGE EXAM:  General: Alert, awake, oriented x3, in no acute distress. Patient states that he feels great this morning. Vital Signs:Blood pressure 147/66, pulse 93, temperature 98.9 F (37.2 C), temperature source Oral, resp. rate 18, height 5\' 11"  (1.803 m), weight 82.9 kg (182 lb 12.2 oz), SpO2 95.00%. OROPHARYNX: Moist, No exudate/ erythema/lesions.  Heart: Regular rate and rhythm, without murmurs, rubs, gallops.  Lungs: Clear to auscultation.  Abdomen: Soft, nontender, nondistended, positive bowel sounds, no masses no hepatosplenomegaly noted..  Neuro: Pt has a distinct difference in strength in BLE's with  strength in LLE 3+/5 and RLE 3+/5  Musculoskeletal: No warm swelling or erythema around joints, no spinal tenderness noted. No calf tenderness.     Basename 08/03/12 0510 08/02/12 0954  NA 133* 132*  K 4.2 4.2  CL 99 97  CO2 24 27  GLUCOSE 137* 232*  BUN 16 17  CREATININE 1.30 1.39*  CALCIUM 8.4 8.3*  MG -- --  PHOS -- --   No results found for this basename: AST:2,ALT:2,ALKPHOS:2,BILITOT:2,PROT:2,ALBUMIN:2 in the last 72 hours No results found for this basename: LIPASE:2,AMYLASE:2 in the last 72 hours  Basename 08/01/12 0541 07/31/12 1720  WBC 9.2 8.1  NEUTROABS 6.5 --  HGB 12.1* 12.0*  HCT 35.2* 34.4*  MCV 83.0 82.7  PLT 308 285   Total time for discharge process including face-to-face time greater than 30 minutes Signed: Garlon Tuggle A. 08/03/2012, 11:58 AM

## 2012-09-19 ENCOUNTER — Other Ambulatory Visit (HOSPITAL_COMMUNITY): Payer: Self-pay | Admitting: Internal Medicine

## 2013-08-31 ENCOUNTER — Other Ambulatory Visit: Payer: Self-pay | Admitting: Nephrology

## 2013-09-03 ENCOUNTER — Other Ambulatory Visit: Payer: Medicare Other

## 2013-09-10 ENCOUNTER — Other Ambulatory Visit: Payer: Medicare Other

## 2013-09-11 ENCOUNTER — Ambulatory Visit
Admission: RE | Admit: 2013-09-11 | Discharge: 2013-09-11 | Disposition: A | Discharge status: Home / Self Care | Payer: Medicare Other | Source: Ambulatory Visit | Attending: Nephrology | Admitting: Nephrology

## 2013-11-12 ENCOUNTER — Ambulatory Visit (INDEPENDENT_AMBULATORY_CARE_PROVIDER_SITE_OTHER): Payer: Medicare Other | Admitting: Podiatry

## 2013-11-12 ENCOUNTER — Encounter: Payer: Self-pay | Admitting: Podiatry

## 2013-11-12 DIAGNOSIS — M19079 Primary osteoarthritis, unspecified ankle and foot: Secondary | ICD-10-CM

## 2013-11-12 DIAGNOSIS — L738 Other specified follicular disorders: Secondary | ICD-10-CM

## 2013-11-12 NOTE — Progress Notes (Signed)
[name] presents today for followup of his arthritis bilateral foot. He states that his foot is doing so well he is so happy that he came to our office he is very satisfied with the outcome at this point he continues to take 7-1/2 mg of Mobic on a daily basis.  Objective: Pulses remain palpable bilateral there is no erythema edema cellulitis drainage or odor and no pain on palpation or range of motion of the foot.  Assessment: Osteoarthritis bilateral.  Plan: Continue the use of the Mobic on an as-needed basis. Followup with me as needed.

## 2014-04-06 ENCOUNTER — Emergency Department (HOSPITAL_COMMUNITY): Payer: Medicare PPO

## 2014-04-06 ENCOUNTER — Emergency Department (HOSPITAL_COMMUNITY)
Admission: EM | Admit: 2014-04-06 | Discharge: 2014-04-06 | Disposition: A | Discharge status: Home / Self Care | Payer: Medicare PPO | Attending: Emergency Medicine | Admitting: Emergency Medicine

## 2014-04-06 DIAGNOSIS — I10 Essential (primary) hypertension: Secondary | ICD-10-CM | POA: Insufficient documentation

## 2014-04-06 DIAGNOSIS — N419 Inflammatory disease of prostate, unspecified: Secondary | ICD-10-CM | POA: Insufficient documentation

## 2014-04-06 DIAGNOSIS — Z8709 Personal history of other diseases of the respiratory system: Secondary | ICD-10-CM | POA: Insufficient documentation

## 2014-04-06 DIAGNOSIS — C61 Malignant neoplasm of prostate: Secondary | ICD-10-CM

## 2014-04-06 DIAGNOSIS — E119 Type 2 diabetes mellitus without complications: Secondary | ICD-10-CM | POA: Insufficient documentation

## 2014-04-06 DIAGNOSIS — Z880 Allergy status to penicillin: Secondary | ICD-10-CM | POA: Insufficient documentation

## 2014-04-06 DIAGNOSIS — E785 Hyperlipidemia, unspecified: Secondary | ICD-10-CM | POA: Insufficient documentation

## 2014-04-06 DIAGNOSIS — G43909 Migraine, unspecified, not intractable, without status migrainosus: Secondary | ICD-10-CM | POA: Insufficient documentation

## 2014-04-06 DIAGNOSIS — N5082 Scrotal pain: Secondary | ICD-10-CM

## 2014-04-06 DIAGNOSIS — Z794 Long term (current) use of insulin: Secondary | ICD-10-CM | POA: Insufficient documentation

## 2014-04-06 DIAGNOSIS — Z79899 Other long term (current) drug therapy: Secondary | ICD-10-CM | POA: Insufficient documentation

## 2014-04-06 DIAGNOSIS — Z792 Long term (current) use of antibiotics: Secondary | ICD-10-CM | POA: Insufficient documentation

## 2014-04-06 LAB — BASIC METABOLIC PANEL
BUN: 22 mg/dL (ref 6–23)
CO2: 25 mEq/L (ref 19–32)
CREATININE: 1.58 mg/dL — AB (ref 0.50–1.35)
Calcium: 9.8 mg/dL (ref 8.4–10.5)
Chloride: 96 mEq/L (ref 96–112)
GFR calc Af Amer: 48 mL/min — ABNORMAL LOW (ref >90)
GFR calc non Af Amer: 41 mL/min — ABNORMAL LOW (ref >90)
Glucose, Bld: 188 mg/dL — ABNORMAL HIGH (ref 70–99)
POTASSIUM: 4.3 meq/L (ref 3.7–5.3)
Sodium: 134 mEq/L — ABNORMAL LOW (ref 137–147)

## 2014-04-06 LAB — URINE MICROSCOPIC-ADD ON

## 2014-04-06 LAB — CBC WITH DIFFERENTIAL/PLATELET
Basophils Absolute: 0 10*3/uL (ref 0.0–0.1)
Basophils Relative: 0 % (ref 0–1)
Eosinophils Absolute: 0.1 10*3/uL (ref 0.0–0.7)
Eosinophils Relative: 1 % (ref 0–5)
HEMATOCRIT: 34.9 % — AB (ref 39.0–52.0)
Hemoglobin: 11.7 g/dL — ABNORMAL LOW (ref 13.0–17.0)
Lymphocytes Relative: 20 % (ref 12–46)
Lymphs Abs: 1.7 10*3/uL (ref 0.7–4.0)
MCH: 27 pg (ref 26.0–34.0)
MCHC: 33.5 g/dL (ref 30.0–36.0)
MCV: 80.6 fL (ref 78.0–100.0)
MONO ABS: 0.8 10*3/uL (ref 0.1–1.0)
Monocytes Relative: 10 % (ref 3–12)
Neutro Abs: 5.9 10*3/uL (ref 1.7–7.7)
Neutrophils Relative %: 69 % (ref 43–77)
Platelets: 268 10*3/uL (ref 150–400)
RBC: 4.33 MIL/uL (ref 4.22–5.81)
RDW: 13.9 % (ref 11.5–15.5)
WBC: 8.5 10*3/uL (ref 4.0–10.5)

## 2014-04-06 LAB — URINALYSIS, ROUTINE W REFLEX MICROSCOPIC
BILIRUBIN URINE: NEGATIVE
KETONES UR: NEGATIVE mg/dL
Nitrite: POSITIVE — AB
PROTEIN: NEGATIVE mg/dL
Specific Gravity, Urine: 1.015 (ref 1.005–1.030)
Urobilinogen, UA: 0.2 mg/dL (ref 0.0–1.0)
pH: 5.5 (ref 5.0–8.0)

## 2014-04-06 MED ORDER — CIPROFLOXACIN HCL 500 MG PO TABS: 500.0000 mg | ORAL_TABLET | Freq: Two times a day (BID) | ORAL | Status: DC

## 2014-04-06 MED ORDER — ONDANSETRON HCL 4 MG/2ML IJ SOLN
4.0000 mg | Freq: Once | INTRAMUSCULAR | Status: AC
Start: 2014-04-06 — End: 2014-04-06
  Administered 2014-04-06: 4 mg via INTRAVENOUS
  Filled 2014-04-06: qty 2

## 2014-04-06 MED ORDER — HYDROCODONE-ACETAMINOPHEN 5-325 MG PO TABS
1.0000 | ORAL_TABLET | Freq: Once | ORAL | Status: AC
  Administered 2014-04-06: 1 via ORAL
  Filled 2014-04-06: qty 1

## 2014-04-06 MED ORDER — HYDROCODONE-ACETAMINOPHEN 5-325 MG PO TABS: 2.0000 | ORAL_TABLET | ORAL | Status: DC | PRN

## 2014-04-06 MED ORDER — LEVOFLOXACIN 500 MG PO TABS
500.0000 mg | ORAL_TABLET | Freq: Once | ORAL | Status: AC
  Administered 2014-04-06: 500 mg via ORAL
  Filled 2014-04-06: qty 1

## 2014-04-06 MED ORDER — FENTANYL CITRATE 0.05 MG/ML IJ SOLN
50.0000 ug | INTRAMUSCULAR | Status: DC | PRN
  Administered 2014-04-06: 50 ug via INTRAVENOUS
  Filled 2014-04-06: qty 2

## 2014-04-06 NOTE — ED Provider Notes (Signed)
CSN: [id number]     Arrival date & time 04/06/14  56 History   First MD Initiated Contact with Patient 04/06/14 1611     Chief Complaint  Patient presents with  . Groin Swelling      HPI  Vision presents with testicle pain and difficulty urinating. He is having urinary frequency for about a week. Rather sudden left pelvis and left testicle pain last night and this morning. Still urinating frequently but small amounts. No hematuria. No fever shakes chills. No weight loss night sweats or other B. type symptoms. There have prostate problems. He states that his primary care physician has done a blood test" for his prostate in the past. Does not recall and cannot tell if he's had prostate exams. Has never had any testing with his prostate with ultrasound or other studies.  No swelling in his groin. Is painful his left, but not swollen.  Past Medical History  Diagnosis Date  . Hypertension   . Hyperlipemia   . NPH (normal pressure hydrocephalus)   . Chronic bronchitis   . Type II diabetes mellitus   . Migraines     "q now and then"   Past Surgical History  Procedure Laterality Date  . 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 Tonsillectomy  ~ 1980   No family history on file. History  Substance Use Topics  . Smoking status: Never Smoker   . Smokeless tobacco: Never Used  . Alcohol Use: Yes     Comment: 2 drinks a day    Review of Systems  Constitutional: Negative for fever, chills, diaphoresis, appetite change and fatigue.  HENT: Negative for mouth sores, sore throat and trouble swallowing.   Eyes: Negative for visual disturbance.  Respiratory: Negative for cough, chest tightness, shortness of breath and wheezing.   Cardiovascular: Negative for chest pain.  Gastrointestinal: Negative for nausea, vomiting, abdominal pain, diarrhea and abdominal distention.  Endocrine: Positive for polyuria. Negative for polydipsia and polyphagia.  Genitourinary: Positive for urgency, decreased  urine volume and testicular pain. Negative for dysuria, frequency, hematuria and scrotal swelling.  Musculoskeletal: Negative for gait problem.  Skin: Negative for color change, pallor and rash.  Neurological: Negative for dizziness, syncope, light-headedness and headaches.  Hematological: Does not bruise/bleed easily.  Psychiatric/Behavioral: Negative for behavioral problems and confusion.      Allergies  Penicillins  Home Medications   Prior to Admission medications   Medication Sig Start Date End Date Taking? Authorizing Provider  amLODipine (NORVASC) 5 MG tablet Take 5 mg by mouth daily.   Yes Historical Provider, MD  Cetirizine HCl 10 MG CAPS Take 10 mg by mouth daily.    Yes Historical Provider, MD  glimepiride (AMARYL) 1 MG tablet Take 1 mg by mouth daily before breakfast.   Yes Historical Provider, MD  insulin glargine (LANTUS) 100 UNIT/ML injection Inject 25 Units into the skin daily with breakfast.   Yes Historical Provider, MD  simvastatin (ZOCOR) 20 MG tablet Take 20 mg by mouth daily.   Yes Historical Provider, MD  ciprofloxacin (CIPRO) 500 MG tablet Take 1 tablet (500 mg total) by mouth every 12 (twelve) hours. 04/06/14   Tanna Furry, MD  HYDROcodone-acetaminophen (NORCO/VICODIN) 5-325 MG per tablet Take 2 tablets by mouth every 4 (four) hours as needed. 04/06/14   Tanna Furry, MD   BP 112/60  Pulse 84  Temp(Src) 98.9 F (37.2 C) (Oral)  SpO2 98% Physical Exam  Constitutional: He is oriented to person, place,  and time. He appears well-developed and well-nourished. No distress.  HENT:  Head: Normocephalic.  Eyes: Conjunctivae are normal. Pupils are equal, round, and reactive to light. No scleral icterus.  Neck: Normal range of motion. Neck supple. No thyromegaly present.  Cardiovascular: Normal rate and regular rhythm.  Exam reveals no gallop and no friction rub.   No murmur heard. Pulmonary/Chest: Effort normal and breath sounds normal. No respiratory distress. He has  no wheezes. He has no rales.  Abdominal: Soft. Bowel sounds are normal. He exhibits no distension. There is no tenderness. There is no rebound.  Genitourinary:     Musculoskeletal: Normal range of motion.  Neurological: He is alert and oriented to person, place, and time.  Skin: Skin is warm and dry. No rash noted.  Psychiatric: He has a normal mood and affect. His behavior is normal.    ED Course  Procedures (including critical care time) Labs Review Labs Reviewed  URINALYSIS, ROUTINE W REFLEX MICROSCOPIC - Abnormal; Notable for the following:    APPearance CLOUDY (*)    Glucose, UA >1000 (*)    Hgb urine dipstick SMALL (*)    Nitrite POSITIVE (*)    Leukocytes, UA SMALL (*)    All other components within normal limits  URINE MICROSCOPIC-ADD ON - Abnormal; Notable for the following:    Bacteria, UA MANY (*)    All other components within normal limits  BASIC METABOLIC PANEL - Abnormal; Notable for the following:    Sodium 134 (*)    Glucose, Bld 188 (*)    Creatinine, Ser 1.58 (*)    GFR calc non Af Amer 41 (*)    GFR calc Af Amer 48 (*)    All other components within normal limits  CBC WITH DIFFERENTIAL - Abnormal; Notable for the following:    Hemoglobin 11.7 (*)    HCT 34.9 (*)    All other components within normal limits    Imaging Review [place] Art/ven Flow Abd Pelv Doppler  04/06/2014   CLINICAL DATA:  Left scrotal swelling  EXAM: SCROTAL ULTRASOUND  DOPPLER ULTRASOUND OF THE TESTICLES  TECHNIQUE: Complete ultrasound examination of the testicles, epididymis, and other scrotal structures was performed. Color and spectral Doppler ultrasound were also utilized to evaluate blood flow to the testicles.  COMPARISON:  None.  FINDINGS: Right testicle  Measurements: 3.7 x 1.7 x 2.4 cm. 0.8 x 0.5 x 0.8 peripheral anechoic structure superiorly in the right testicle compatible with testicular cyst. Enhance through transmission observed. Right testicle otherwise unremarkable.  Left  testicle  Measurements: 3.2 x 1.6 x 2.3 cm. Mild ectasia of the rete testis as shown on image 60. No mass or microlithiasis visualized.  Right epididymis:  Normal in size and appearance.  Left epididymis:  Normal in size and appearance.  Hydrocele:  None visualized.  Varicocele:  Small left varicocele.  Pulsed Doppler interrogation of both testes demonstrates low resistance arterial and venous waveforms bilaterally.  Soft tissue fullness in the left inguinal canal questionable for hernia, although peristalsis was not directly visualized.  IMPRESSION: 1. No compelling findings of orchitis or testicular mass. 2. Small benign cyst of the right testicle requires no further workup. 3. Mild ectasia of the left reach testis.  This is benign. 4. Small left varicocele. 5. Fullness in the left inguinal canal region suspicious for herniation. No definite peristalsis to suggest herniation of bowel; this could represent herniation of adipose tissue. CT of the pelvis could be utilized for further characterization.  Electronically Signed   By: Sherryl Barters M.D.   On: 04/06/2014 18:07     EKG Interpretation None      MDM   Final diagnoses:  Scrotal pain  Prostatitis  Prostate cancer    Patient's main area of complaint his left hemiscrotum it is sensitive over his testicle. No torsion, no mass, no obvious epididymitis. CT shows some minimal signs of anal hernia. Clinically does not have pain in his inguinal canal. CT scan shows edges of the seminal vesicle, and in the prostate. Differential including prostatitis with necrosis versus prostate cancer. I discussed this at length with the patient. I've given him information for urology to call for followup and stress its importance. He will be treated with Cipro and Vicodin. I think is appropriate for outpatient treatment at this time. Clinically again no anal hernia. No GI symptoms of nausea vomiting diarrhea or fevers. He is having polyuria and dysuria. Urine  culture pending.   Tanna Furry, MD 04/06/14 2052

## 2014-04-06 NOTE — ED Notes (Signed)
Pt to US.

## 2014-04-06 NOTE — ED Notes (Signed)
Signature trackpad in room not working, pt unable to sign for d/c instructions.  Pt verbalized understanding, questions answered, pt denies further needs.

## 2014-04-06 NOTE — Discharge Instructions (Signed)
Your CT scan shows changes in your prostate. These changes could be from an infection(prostatitis), or from prostate Cancer. Please call the Urologist for an appointment. Fill and take the prescription for Cipro.  Prostate Cancer The prostate is a male gland that helps produce semen. Prostate cancer is the abnormal growth of cells in this gland. HOME CARE  Only take over-the-counter or prescription medicines as told by your doctor.  Eat a healthy diet.  Get plenty of sleep.  Consider joining a support group.  Seek advice to help you manage treatment side effects.  Keep all follow-up visits as told by your doctor.  Tell your cancer specialist if you are admitted to the hospital.  Touch, hold, hug, and caress your partner to continue to show sexual feelings. GET HELP IF:  You have trouble peeing (urinating).  You have blood in your pee.  You have trouble having an erection.  You have pain in your hips, back, or chest. GET HELP RIGHT AWAY IF:  You have weakness or loss of feeling (numbness) in your legs.  You have accidental loss of pee or poop (stool). Document Released: 11/28/2009 Document Revised: 09/30/2013 Document Reviewed: 05/29/2013 [facility] Patient Information 2014 [employer], [place].  Prostatitis Prostatitis is redness, soreness, and puffiness (swelling) of the prostate gland. The prostate gland is the walnut-sized gland located just below your bladder. HOME CARE:   Take all medicines as told by your doctor.  Take warm-water baths (sitz baths) as told by your doctor. GET HELP IF:  Your symptoms get worse, not better.  You have a fever. GET HELP RIGHT AWAY IF:   You have chills.  You feel sick to your stomach (nauseous) or like you will throw up (vomit).  You feel lightheaded or like you will pass out (faint).  You are unable to pee (urinate).  You have blood or blood clumps (clots) in your pee (urine). MAKE SURE YOU:  Understand these  instructions.  Will watch your condition.  Will get help right away if you are not doing well or get worse. Document Released: 06/10/2012 Document Revised: 08/12/2013 Document Reviewed: 06/29/2013 [facility] Patient Information 2014 [place], [place].

## 2014-04-06 NOTE — ED Notes (Signed)
Pt states that he has had genitalia swelling x 1 month and his regular doctor gave his 2 different medications (does not know what they are) that are not helping. States the swelling got worse today. Poor historian. Alert and oriented.

## 2014-04-12 ENCOUNTER — Observation Stay (HOSPITAL_COMMUNITY)
Admission: EM | Admit: 2014-04-12 | Discharge: 2014-04-14 | Disposition: A | Discharge status: Home / Self Care | Payer: Medicare PPO | Source: Ambulatory Visit | Attending: Urology | Admitting: Urology

## 2014-04-12 ENCOUNTER — Other Ambulatory Visit: Payer: Self-pay | Admitting: Urology

## 2014-04-12 ENCOUNTER — Ambulatory Visit (HOSPITAL_COMMUNITY): Payer: Medicare PPO

## 2014-04-12 ENCOUNTER — Encounter (HOSPITAL_COMMUNITY)
Admission: EM | Disposition: A | Discharge status: Home / Self Care | Payer: Self-pay | Source: Ambulatory Visit | Attending: Urology

## 2014-04-12 ENCOUNTER — Encounter (HOSPITAL_COMMUNITY): Payer: Medicare PPO | Admitting: Anesthesiology

## 2014-04-12 ENCOUNTER — Ambulatory Visit (HOSPITAL_COMMUNITY): Payer: Medicare PPO | Admitting: Anesthesiology

## 2014-04-12 ENCOUNTER — Encounter (HOSPITAL_COMMUNITY): Payer: Self-pay | Admitting: *Deleted

## 2014-04-12 DIAGNOSIS — N289 Disorder of kidney and ureter, unspecified: Secondary | ICD-10-CM | POA: Insufficient documentation

## 2014-04-12 DIAGNOSIS — N410 Acute prostatitis: Secondary | ICD-10-CM | POA: Insufficient documentation

## 2014-04-12 DIAGNOSIS — E119 Type 2 diabetes mellitus without complications: Secondary | ICD-10-CM | POA: Insufficient documentation

## 2014-04-12 DIAGNOSIS — E785 Hyperlipidemia, unspecified: Secondary | ICD-10-CM | POA: Insufficient documentation

## 2014-04-12 DIAGNOSIS — Z79899 Other long term (current) drug therapy: Secondary | ICD-10-CM | POA: Insufficient documentation

## 2014-04-12 DIAGNOSIS — I10 Essential (primary) hypertension: Secondary | ICD-10-CM | POA: Insufficient documentation

## 2014-04-12 DIAGNOSIS — N453 Epididymo-orchitis: Secondary | ICD-10-CM | POA: Insufficient documentation

## 2014-04-12 DIAGNOSIS — Z982 Presence of cerebrospinal fluid drainage device: Secondary | ICD-10-CM | POA: Insufficient documentation

## 2014-04-12 DIAGNOSIS — N412 Abscess of prostate: Principal | ICD-10-CM | POA: Diagnosis present

## 2014-04-12 DIAGNOSIS — G912 (Idiopathic) normal pressure hydrocephalus: Secondary | ICD-10-CM | POA: Insufficient documentation

## 2014-04-12 HISTORY — PX: TRANSURETHRAL RESECTION OF PROSTATE: SHX73

## 2014-04-12 HISTORY — DX: Abscess of prostate: N41.2

## 2014-04-12 LAB — BASIC METABOLIC PANEL
BUN: 30 mg/dL — ABNORMAL HIGH (ref 6–23)
CO2: 27 mEq/L (ref 19–32)
Calcium: 9.8 mg/dL (ref 8.4–10.5)
Chloride: 99 mEq/L (ref 96–112)
Creatinine, Ser: 2.38 mg/dL — ABNORMAL HIGH (ref 0.50–1.35)
GFR calc Af Amer: 29 mL/min — ABNORMAL LOW (ref >90)
GFR calc non Af Amer: 25 mL/min — ABNORMAL LOW (ref >90)
Glucose, Bld: 83 mg/dL (ref 70–99)
Potassium: 4.2 mEq/L (ref 3.7–5.3)
Sodium: 141 mEq/L (ref 137–147)

## 2014-04-12 LAB — GLUCOSE, CAPILLARY
GLUCOSE-CAPILLARY: 126 mg/dL — AB (ref 70–99)
GLUCOSE-CAPILLARY: 108 mg/dL — AB (ref 70–99)
Glucose-Capillary: 78 mg/dL (ref 70–99)
Glucose-Capillary: 59 mg/dL — ABNORMAL LOW (ref 70–99)

## 2014-04-12 SURGERY — TURP (TRANSURETHRAL RESECTION OF PROSTATE)
Anesthesia: General | Site: Prostate

## 2014-04-12 MED ORDER — DEXTROSE 50 % IV SOLN
INTRAVENOUS | Status: AC
  Filled 2014-04-12: qty 50

## 2014-04-12 MED ORDER — DEXTROSE 50 % IV SOLN
25.0000 mL | Freq: Once | INTRAVENOUS | Status: AC | PRN
  Administered 2014-04-12: 25 mL via INTRAVENOUS

## 2014-04-12 MED ORDER — LACTATED RINGERS IV SOLN
INTRAVENOUS | Status: DC | PRN
  Administered 2014-04-12: 17:00:00 via INTRAVENOUS

## 2014-04-12 MED ORDER — HYDROCODONE-ACETAMINOPHEN 5-325 MG PO TABS
2.0000 | ORAL_TABLET | ORAL | Status: DC | PRN
  Administered 2014-04-13: 2 via ORAL
  Administered 2014-04-13: 1 via ORAL
  Filled 2014-04-12: qty 2
  Filled 2014-04-12: qty 1

## 2014-04-12 MED ORDER — POTASSIUM CHLORIDE IN NACL 20-0.45 MEQ/L-% IV SOLN
INTRAVENOUS | Status: DC
  Administered 2014-04-12 – 2014-04-14 (×4): via INTRAVENOUS
  Filled 2014-04-12 (×10): qty 1000

## 2014-04-12 MED ORDER — SODIUM CHLORIDE 0.9 % IR SOLN
Status: DC | PRN
  Administered 2014-04-12: 9000 mL via INTRAVESICAL

## 2014-04-12 MED ORDER — DEXTROSE 5 % IV SOLN
240.0000 mg | INTRAVENOUS | Status: AC
  Administered 2014-04-12: 240 mg via INTRAVENOUS
  Filled 2014-04-12: qty 6

## 2014-04-12 MED ORDER — PHENOL 1.4 % MT LIQD
1.0000 | OROMUCOSAL | Status: DC | PRN
  Filled 2014-04-12: qty 177

## 2014-04-12 MED ORDER — MENTHOL 3 MG MT LOZG
1.0000 | LOZENGE | OROMUCOSAL | Status: DC | PRN
  Filled 2014-04-12: qty 9

## 2014-04-12 MED ORDER — ONDANSETRON HCL 4 MG/2ML IJ SOLN: 4.0000 mg | INTRAMUSCULAR | Status: DC | PRN

## 2014-04-12 MED ORDER — FENTANYL CITRATE 0.05 MG/ML IJ SOLN: 25.0000 ug | INTRAMUSCULAR | Status: DC | PRN

## 2014-04-12 MED ORDER — INSULIN ASPART 100 UNIT/ML ~~LOC~~ SOLN
4.0000 [IU] | Freq: Three times a day (TID) | SUBCUTANEOUS | Status: DC
  Administered 2014-04-13 – 2014-04-14 (×4): 4 [IU] via SUBCUTANEOUS

## 2014-04-12 MED ORDER — PROPOFOL 10 MG/ML IV BOLUS
INTRAVENOUS | Status: AC
  Filled 2014-04-12: qty 20

## 2014-04-12 MED ORDER — INSULIN ASPART 100 UNIT/ML ~~LOC~~ SOLN
0.0000 [IU] | Freq: Three times a day (TID) | SUBCUTANEOUS | Status: DC
  Administered 2014-04-13: 3 [IU] via SUBCUTANEOUS
  Administered 2014-04-14 (×2): 2 [IU] via SUBCUTANEOUS

## 2014-04-12 MED ORDER — CEFAZOLIN SODIUM 1-5 GM-% IV SOLN
1.0000 g | Freq: Three times a day (TID) | INTRAVENOUS | Status: DC
  Filled 2014-04-12 (×2): qty 50

## 2014-04-12 MED ORDER — FENTANYL CITRATE 0.05 MG/ML IJ SOLN
INTRAMUSCULAR | Status: AC
  Filled 2014-04-12: qty 2

## 2014-04-12 MED ORDER — LACTATED RINGERS IV SOLN: INTRAVENOUS | Status: DC

## 2014-04-12 MED ORDER — ACETAMINOPHEN 325 MG PO TABS: 650.0000 mg | ORAL_TABLET | ORAL | Status: DC | PRN

## 2014-04-12 MED ORDER — CEFAZOLIN SODIUM 1-5 GM-% IV SOLN
1.0000 g | Freq: Two times a day (BID) | INTRAVENOUS | Status: DC
  Administered 2014-04-13 – 2014-04-14 (×3): 1 g via INTRAVENOUS
  Filled 2014-04-12 (×5): qty 50

## 2014-04-12 MED ORDER — DIPHENHYDRAMINE HCL 12.5 MG/5ML PO ELIX: 12.5000 mg | ORAL_SOLUTION | Freq: Four times a day (QID) | ORAL | Status: DC | PRN

## 2014-04-12 MED ORDER — SIMVASTATIN 20 MG PO TABS
20.0000 mg | ORAL_TABLET | Freq: Every day | ORAL | Status: DC
  Administered 2014-04-12 – 2014-04-14 (×3): 20 mg via ORAL
  Filled 2014-04-12 (×3): qty 1

## 2014-04-12 MED ORDER — DIPHENHYDRAMINE HCL 50 MG/ML IJ SOLN: 12.5000 mg | Freq: Four times a day (QID) | INTRAMUSCULAR | Status: DC | PRN

## 2014-04-12 MED ORDER — GLIMEPIRIDE 1 MG PO TABS
1.0000 mg | ORAL_TABLET | Freq: Every day | ORAL | Status: DC
  Administered 2014-04-13 – 2014-04-14 (×2): 1 mg via ORAL
  Filled 2014-04-12 (×3): qty 1

## 2014-04-12 MED ORDER — DOCUSATE SODIUM 100 MG PO CAPS
100.0000 mg | ORAL_CAPSULE | Freq: Two times a day (BID) | ORAL | Status: DC
  Administered 2014-04-12 – 2014-04-14 (×4): 100 mg via ORAL
  Filled 2014-04-12 (×5): qty 1

## 2014-04-12 MED ORDER — CEFAZOLIN SODIUM 1-5 GM-% IV SOLN
1.0000 g | Freq: Three times a day (TID) | INTRAVENOUS | Status: DC
  Filled 2014-04-12: qty 50

## 2014-04-12 MED ORDER — CEFAZOLIN SODIUM-DEXTROSE 2-3 GM-% IV SOLR
2.0000 g | INTRAVENOUS | Status: AC
  Administered 2014-04-12: 2 g via INTRAVENOUS

## 2014-04-12 MED ORDER — FENTANYL CITRATE 0.05 MG/ML IJ SOLN
INTRAMUSCULAR | Status: DC | PRN
  Administered 2014-04-12 (×4): 25 ug via INTRAVENOUS

## 2014-04-12 MED ORDER — HYDROMORPHONE HCL PF 1 MG/ML IJ SOLN: 0.5000 mg | INTRAMUSCULAR | Status: DC | PRN

## 2014-04-12 MED ORDER — KCL IN DEXTROSE-NACL 20-5-0.45 MEQ/L-%-% IV SOLN
INTRAVENOUS | Status: DC
  Administered 2014-04-12: 15:00:00 via INTRAVENOUS
  Filled 2014-04-12: qty 1000

## 2014-04-12 MED ORDER — BISACODYL 10 MG RE SUPP: 10.0000 mg | Freq: Every day | RECTAL | Status: DC | PRN

## 2014-04-12 MED ORDER — AMLODIPINE BESYLATE 5 MG PO TABS
5.0000 mg | ORAL_TABLET | Freq: Every day | ORAL | Status: DC
  Administered 2014-04-12 – 2014-04-14 (×3): 5 mg via ORAL
  Filled 2014-04-12 (×3): qty 1

## 2014-04-12 MED ORDER — HYOSCYAMINE SULFATE 0.125 MG SL SUBL
0.1250 mg | SUBLINGUAL_TABLET | SUBLINGUAL | Status: DC | PRN
  Administered 2014-04-12: 0.125 mg via ORAL
  Filled 2014-04-12: qty 1

## 2014-04-12 MED ORDER — GENTAMICIN SULFATE 40 MG/ML IJ SOLN
400.0000 mg | Freq: Once | INTRAVENOUS | Status: DC
  Filled 2014-04-12: qty 10

## 2014-04-12 MED ORDER — CEFAZOLIN SODIUM-DEXTROSE 2-3 GM-% IV SOLR
INTRAVENOUS | Status: AC
  Filled 2014-04-12: qty 50

## 2014-04-12 MED ORDER — LORATADINE 10 MG PO TABS
10.0000 mg | ORAL_TABLET | Freq: Every day | ORAL | Status: DC
Start: 2014-04-12 — End: 2014-04-14
  Administered 2014-04-12 – 2014-04-14 (×3): 10 mg via ORAL
  Filled 2014-04-12 (×3): qty 1

## 2014-04-12 MED ORDER — PROPOFOL 10 MG/ML IV BOLUS
INTRAVENOUS | Status: DC | PRN
  Administered 2014-04-12: 100 mg via INTRAVENOUS
  Administered 2014-04-12: 20 mg via INTRAVENOUS

## 2014-04-12 MED ORDER — SODIUM CHLORIDE 0.9 % IR SOLN
3000.0000 mL | Status: DC
  Administered 2014-04-12: 3000 mL

## 2014-04-12 MED ORDER — SODIUM CHLORIDE 0.9 % IV SOLN
INTRAVENOUS | Status: DC
  Administered 2014-04-12: 1000 mL via INTRAVENOUS

## 2014-04-12 MED ORDER — FLUTICASONE PROPIONATE 50 MCG/ACT NA SUSP
2.0000 | Freq: Every day | NASAL | Status: DC
  Administered 2014-04-12 – 2014-04-14 (×3): 2 via NASAL
  Filled 2014-04-12: qty 16

## 2014-04-12 MED ORDER — ONDANSETRON HCL 4 MG/2ML IJ SOLN
INTRAMUSCULAR | Status: DC | PRN
  Administered 2014-04-12: 4 mg via INTRAVENOUS

## 2014-04-12 SURGICAL SUPPLY — 22 items
BAG URINE DRAINAGE (UROLOGICAL SUPPLIES) ×2 IMPLANT
BAG URO CATCHER STRL LF (DRAPE) ×3 IMPLANT
CATH FOLEY 3WAY 30CC 22FR (CATHETERS) ×2 IMPLANT
ELECT LOOP MED HF 24F 12D CBL (CLIP) ×2 IMPLANT
ELECT REM PT RETURN 9FT ADLT (ELECTROSURGICAL)
ELECTRODE REM PT RTRN 9FT ADLT (ELECTROSURGICAL) ×1 IMPLANT
GLOVE BIO SURGEON STRL SZ7.5 (GLOVE) ×2 IMPLANT
GLOVE BIOGEL PI IND STRL 7.5 (GLOVE) IMPLANT
GLOVE BIOGEL PI INDICATOR 7.5 (GLOVE) ×2
GLOVE SURG SS PI 8.0 STRL IVOR (GLOVE) ×2 IMPLANT
GOWN STRL REUS W/ TWL XL LVL3 (GOWN DISPOSABLE) IMPLANT
GOWN STRL REUS W/TWL XL LVL3 (GOWN DISPOSABLE) ×6 IMPLANT
HOLDER FOLEY CATH W/STRAP (MISCELLANEOUS) IMPLANT
KIT ASPIRATION TUBING (SET/KITS/TRAYS/PACK) ×3 IMPLANT
LOOPS RESECTOSCOPE DISP (ELECTROSURGICAL) ×3 IMPLANT
MANIFOLD NEPTUNE II (INSTRUMENTS) ×3 IMPLANT
PACK CYSTO (CUSTOM PROCEDURE TRAY) ×3 IMPLANT
SUT ETHILON 3 0 PS 1 (SUTURE) IMPLANT
SYR 30ML LL (SYRINGE) IMPLANT
SYRINGE IRR TOOMEY STRL 70CC (SYRINGE) IMPLANT
TUBING CONNECTING 10 (TUBING) ×2 IMPLANT
TUBING CONNECTING 10' (TUBING) ×1

## 2014-04-12 NOTE — H&P (Signed)
Problems   1. 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 Epididymitis (604.90)   left  3. 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 Prostatitis, acute (601.0)  History of Present Illness  [name] is a 73 yo BM who was seen in the ER on 4/14 for left testicular pain and swelling.  He was found to have an possible UTI.  A scrotal US was negative for epididymitis but there was a concern for a LIH.  He had a CT urogram that showed a large hypoechoic lesion at the left prostate base with periprostatic stranding and an enlarged left SV.  The lesion was given a DD of prostatic abscess vs possible necrosis.  On my review it looks like a large prostatic cyst.   He reports a 1 month history of difficulty voiding  with frequent small voids with nocturia up to 10-12x.  He had some dysuria but no hematuria.  He had no fever.   He has not had prior episodes.   He has no prior GU history.   Past Medical History Problems   1. History of chronic bronchitis (V12.69)  2. History of diabetes mellitus (V12.29)  3. History of hyperlipidemia (V12.29)  4. History of hypertension (V12.59)  5. History of migraine headaches (V12.49)  6. 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 History of Tonsillectomy  2. 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 Cipro 500 MG Oral Tablet;  Therapy: (Recorded:20Apr2015) to Recorded  2. Lantus SoloStar SOLN;  Therapy: (Recorded:20Apr2015) to Recorded  3. Losartan Potassium 50 MG Oral Tablet;  Therapy: (Recorded:20Apr2015) to Recorded  4. Simvastatin 20 MG Oral Tablet;  Therapy: (Recorded:20Apr2015) to Recorded  Allergies Medication   1. Penicillins  Family History Problems   1. Family history of Deceased : Mother, Father  2. Family history of cardiac disorder (V17.49) : Mother, Father  3. Family history of diabetes mellitus (V18.0) : Mother  Social History Problems    Divorced   Never a smoker   No alcohol use    No caffeine use   Retired  Review of Systems Genitourinary, constitutional, skin, eye, otolaryngeal, hematologic/lymphatic, cardiovascular, pulmonary, endocrine, musculoskeletal, gastrointestinal, neurological and psychiatric system(s) were reviewed and pertinent findings if present are noted.  Genitourinary: urinary frequency, feelings of urinary urgency, dysuria, nocturia, incontinence, weak urinary stream, urinary stream starts and stops, incomplete emptying of bladder and suprapubic pain, but no hematuria.  Gastrointestinal: no nausea, no abdominal pain, no diarrhea and no constipation.  Constitutional: no fever and no recent weight loss.  Cardiovascular: no chest pain.  Respiratory: no shortness of breath.  Neurological: tingling (right hand at times. ).    Vitals Vital Signs [Data Includes: Last 1 Day]  Recorded: 20Apr2015 09:23AM  Height: 5 ft 6 in Weight: 180 lb  BMI Calculated: 29.05 BSA Calculated: 1.91 Blood Pressure: 110 / 61 Temperature: 97 F Heart Rate: 107  Physical Exam Constitutional: Well nourished and well developed . No acute distress.  ENT:. The ears and nose are normal in appearance.  Neck: The appearance of the neck is normal and no neck mass is present.  Pulmonary: No respiratory distress and normal respiratory rhythm and effort.  Cardiovascular: Heart rate and rhythm are normal . No peripheral edema.  Abdomen: The abdomen is soft and nontender. No masses are palpated. No CVA tenderness. No hernias are palpable. No hepatosplenomegaly noted.  Rectal: Rectal exam demonstrates normal sphincter tone, no tenderness and no masses. Estimated prostate  size is 3+. (with some increased firmness, but I wasn't able to get over the top of the prostate to fell the full extent. ). The prostate has no nodularity and is tender. The left seminal vesicle is nonpalpable. The right seminal vesicle is nonpalpable. The perineum is normal on inspection.  Genitourinary: Examination of  the penis demonstrates no discharge, no masses, no lesions and a normal meatus. The scrotum is without lesions. The right epididymis is palpably normal and non-tender. The left epididymis is tender and enlarged. The right spermatic cord is palpably normal. The left spermatic cord is palpably normal. The right testis is non-tender and without masses. The left testis is tender, but without masses.  Lymphatics: The supraclavicular, femoral and inguinal nodes are not enlarged or tender.  Skin: Normal skin turgor, no visible rash and no visible skin lesions.  Neuro/Psych:. Mood and affect are appropriate.    Results/Data Urine [Data Includes: Last 1 Day]   20Apr2015  COLOR YELLOW   APPEARANCE CLOUDY   SPECIFIC GRAVITY 1.020   pH 5.5   GLUCOSE > 1000 mg/dL  BILIRUBIN NEG   KETONE NEG mg/dL  BLOOD LARGE   PROTEIN 30 mg/dL  UROBILINOGEN 0.2 mg/dL  NITRITE POS   LEUKOCYTE ESTERASE MOD   SQUAMOUS EPITHELIAL/HPF RARE   WBC TNTC WBC/hpf  RBC 7-10 RBC/hpf  BACTERIA MANY   CRYSTALS NONE SEEN   CASTS NONE SEEN    I have reviewed the ER notes.  The following images/tracing/specimen were independently visualized:  I have reviewed his CT films and report. He had a bladder and prostatic [place] today that demonstrates a PVR of 130cc and a large cystic lesion in the proximal prostate to the left that has a rim with increased vascularity consistent with an abscess or infected cyst. There is apparent obstruction of the left ejaculatory duct and dilation of the left SV as well. See study sheet for detailed measurements.  The following clinical lab reports were reviewed:  UA reviewed. I have reviewed his labs from his recent ER visit. 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 He has a mild anemia as well.  PVR: Ultrasound PVR 136 ml.    Procedure  Gent 160mg  IM given.     Assessment Assessed   1. Epididymitis (604.90)  2. Prostatitis, acute (601.0)  3. 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   Plan  Cyst of prostate   1. BLADDER U/S; Status:Resulted - Requires Verification;   Done: 20Apr2015 12:00AM  2. PROSTATE U/S; Status:Active; Requested for:20Apr2015;  Epididymitis   3. Start: Sulfamethoxazole-TMP DS 800-160 MG Oral Tablet; TAKE 1 TABLET TWICE DAILY  4. Administered: Gentamicin Sulfate 40 MG/ML Injection Solution  5. Follow-up Lab Only Office  Follow-up  Status: Hold For - Appointment,Date of Service   Requested for: 27Apr2015  6. Follow-up MD/NP/PA Office  Follow-up  Status: Hold For - Appointment,Date of Service   Requested for: 20Apr2015  7. BASIC METABOLIC PANEL; Status:Hold For - Specimen/Data Collection,Appointment;  Requested for:27Apr2015;   8. Complex Uroflowmetry; Status:Hold For - Appointment,Date of Service; Requested  for:20Apr2015;   9. IPSS Questionnarie; Status:Hold For - Appointment,Date of Service; Requested  for:20Apr2015;   10. PVR U/S; Status:Canceled - Appointment,Date of Service;  Epididymitis, Prostatitis, acute   11. Administered: Gentamicin Sulfate 40 MG/ML Injection Solution Health Maintenance   12. UA With REFLEX; [Do Not Release]; Status:Resulted -  Requires Verification;   Done:   20Apr2015 08:56AM Prostatic abscess   13. Follow-up Schedule Surgery Office  Follow-up  Status: Hold For - Appointment    Requested for: 20Apr2015   I am going to set him up for this evening for TUR of the prostatic abscess.   I reviewed the risks of bleeding, infection, stricture, incontinence, need for secondary procedures, ED and ejaculatory dysfunction, thrombotic events and anesthetic risks.     URINE CULTURE; Status:In Progress - Specimen/Data Collected;   Done: 20Apr2015 Perform:[facility]; Due:22Apr2015; Marked Important; Last Updated TS:[id number], Santiago Glad; 04/12/2014 9:52:26 AM;Ordered; Today;   QZE:[id number]; Ordered JF:[id number], Tara Wich;

## 2014-04-12 NOTE — Progress Notes (Signed)
ANTIBIOTIC CONSULT NOTE - INITIAL  Pharmacy Consult for gentamicin, renal antibiotic adjustment Indication: UTI / prostate abscess  Allergies  Allergen Reactions  . Penicillins Swelling    Childhood reaction; "swelled my arm up"    Patient Measurements: Height: 5\' 11"  (180.3 cm) Weight: 180 lb (81.647 kg) IBW/kg (Calculated) : 75.3  Vital Signs: Temp: 97.5 F (36.4 C) (04/20 1845) BP: 164/88 mmHg (04/20 1845) Pulse Rate: 81 (04/20 1845)  Labs:  Recent Labs  04/12/14 1330  CREATININE 2.38*   Estimated Creatinine Clearance: 28.6 ml/min (by C-G formula based on Cr of 2.38).  Microbiology: No results found for this or any previous visit (from the past 720 hour(s)).  Medical History: Past Medical History  Diagnosis Date  . Hypertension   . Hyperlipemia   . NPH (normal pressure hydrocephalus)   . Chronic bronchitis   . Type II diabetes mellitus   . Migraines     "q now and then"  . Prostatic abscess 04/12/14    transurethral resection of prostatic abscess    Medications:  Scheduled:  . amLODipine  5 mg Oral Daily  . [START ON 04/13/2014]  ceFAZolin (ANCEF) IV  1 g Intravenous Q12H  . dextrose      . docusate sodium  100 mg Oral BID  . fluticasone  2 spray Each Nare Daily  . [START ON 04/13/2014] glimepiride  1 mg Oral QAC breakfast  . [START ON 04/13/2014] insulin aspart  0-15 Units Subcutaneous TID WC  . [START ON 04/13/2014] insulin aspart  4 Units Subcutaneous TID WC  . loratadine  10 mg Oral Daily  . simvastatin  20 mg Oral Daily   Infusions:  . 0.45 % NaCl with KCl 20 mEq / L    . sodium chloride irrigation     Assessment: 89 yoM admitted 4/20 for transurethral resection of prostatic abscess. Pharmacy has been consulted to dose gentamicin and renally adjust cefazolin post-operatively. Noted patient with a course of ciprofloxacin started 4/14 prior to admission  Antiinfectives 4/20 >> cefazolin >> 4/20 >> gentamicin >>    Labs / vitals Tmax:  afebrile WBCs: WNL Renal: SCr elevated to 2.38 (baseline ~1.3), CrCl 28 ml/min CG, 27 ml/min normalized  No culture data from this admission   Goal of Therapy:  Gentamicin trough level <2 mcg/ml appropriate dosing of cefazolin for indication and renal function  Plan:  - gentamicin 400mg  IV x1 tomorrow at 1400 - change cefazolin to 1g IV q12h - follow-up plans for discharge to determine if another gentamicin dose will be needed- follow renal function, clinical course  Thank you for the consult.  Johny Drilling, PharmD, BCPS Pager: [phone] Pharmacy: [phone] 04/12/2014 8:03 PM

## 2014-04-12 NOTE — Brief Op Note (Signed)
04/12/2014  5:34 PM  PATIENT:  Bob Vazquez  73 y.o. male  PRE-OPERATIVE DIAGNOSIS:  prostate abscess  POST-OPERATIVE DIAGNOSIS:  prostate abscess  PROCEDURE:  Procedure(s) with comments: TRANSURETHRAL RESECTION OF THE PROSTATE (TURP) Abscess (N/A) - gyrus instruments  SURGEON:  Surgeon(s) and Role:    * Irine Seal, MD - Primary  PHYSICIAN ASSISTANT:   ASSISTANTS: none   ANESTHESIA:   general  EBL:     BLOOD ADMINISTERED:none  DRAINS: Urinary Catheter (Foley)   LOCAL MEDICATIONS USED:  NONE  SPECIMEN:  Source of Specimen:  prostate  DISPOSITION OF SPECIMEN:  PATHOLOGY  COUNTS:  YES  TOURNIQUET:  * No tourniquets in log *  DICTATION: .Other Dictation: Dictation Number [id number]  PLAN OF CARE: Admit for overnight observation  PATIENT DISPOSITION:  PACU - hemodynamically stable.   Delay start of Pharmacological VTE agent (>24hrs) due to surgical blood loss or risk of bleeding: yes

## 2014-04-12 NOTE — Anesthesia Preprocedure Evaluation (Signed)
Anesthesia Evaluation  Patient identified by MRN, date of birth, ID band Patient awake    Reviewed: Allergy & Precautions, H&P , NPO status , Patient's Chart, lab work & pertinent test results  Airway Mallampati: II TM Distance: >3 FB Neck ROM: Full    Dental no notable dental hx.    Pulmonary neg pulmonary ROS,  breath sounds clear to auscultation  Pulmonary exam normal       Cardiovascular Exercise Tolerance: Good hypertension, Pt. on medications Rhythm:Regular Rate:Normal     Neuro/Psych  Headaches, negative psych ROS   GI/Hepatic negative GI ROS, Neg liver ROS,   Endo/Other  negative endocrine ROSdiabetes, Type 2, Oral Hypoglycemic Agents  Renal/GU Renal InsufficiencyRenal disease  negative genitourinary   Musculoskeletal negative musculoskeletal ROS (+)   Abdominal   Peds negative pediatric ROS (+)  Hematology negative hematology ROS (+)   Anesthesia Other Findings   Reproductive/Obstetrics negative OB ROS                           Anesthesia Physical Anesthesia Plan  ASA: III  Anesthesia Plan: General   Post-op Pain Management:    Induction: Intravenous  Airway Management Planned: LMA  Additional Equipment:   Intra-op Plan:   Post-operative Plan: Extubation in OR  Informed Consent: I have reviewed the patients History and Physical, chart, labs and discussed the procedure including the risks, benefits and alternatives for the proposed anesthesia with the patient or authorized representative who has indicated his/her understanding and acceptance.   Dental advisory given  Plan Discussed with: CRNA  Anesthesia Plan Comments:         Anesthesia Quick Evaluation

## 2014-04-12 NOTE — Anesthesia Postprocedure Evaluation (Signed)
  Anesthesia Post-op Note  Patient: Bob Vazquez  Procedure(s) Performed: Procedure(s) (LRB): TRANSURETHRAL RESECTION OF THE PROSTATE (TURP) Abscess (N/A)  Patient Location: PACU  Anesthesia Type: General  Level of Consciousness: awake and alert   Airway and Oxygen Therapy: Patient Spontanous Breathing  Post-op Pain: mild  Post-op Assessment: Post-op Vital signs reviewed, Patient's Cardiovascular Status Stable, Respiratory Function Stable, Patent Airway and No signs of Nausea or vomiting  Last Vitals:  Filed Vitals:   04/12/14 1845  BP: 164/88  Pulse: 81  Temp: 36.4 C  Resp: 16    Post-op Vital Signs: stable   Complications: No apparent anesthesia complications

## 2014-04-12 NOTE — Transfer of Care (Signed)
Immediate Anesthesia Transfer of Care Note  Patient: Bob Vazquez  Procedure(s) Performed: Procedure(s) (LRB): TRANSURETHRAL RESECTION OF THE PROSTATE (TURP) Abscess (N/A)  Patient Location: PACU  Anesthesia Type: General  Level of Consciousness: sedated, patient cooperative and responds to stimulation  Airway & Oxygen Therapy: Patient Spontanous Breathing and Patient connected to face mask oxgen  Post-op Assessment: Report given to PACU RN and Post -op Vital signs reviewed and stable  Post vital signs: Reviewed and stable  Complications: No apparent anesthesia complications

## 2014-04-13 ENCOUNTER — Encounter (HOSPITAL_COMMUNITY): Payer: Self-pay | Admitting: Urology

## 2014-04-13 LAB — BASIC METABOLIC PANEL
BUN: 23 mg/dL (ref 6–23)
CHLORIDE: 103 meq/L (ref 96–112)
CO2: 25 meq/L (ref 19–32)
Calcium: 8.8 mg/dL (ref 8.4–10.5)
Creatinine, Ser: 1.78 mg/dL — ABNORMAL HIGH (ref 0.50–1.35)
GFR calc Af Amer: 41 mL/min — ABNORMAL LOW (ref >90)
GFR calc non Af Amer: 36 mL/min — ABNORMAL LOW (ref >90)
Glucose, Bld: 62 mg/dL — ABNORMAL LOW (ref 70–99)
POTASSIUM: 4.4 meq/L (ref 3.7–5.3)
SODIUM: 140 meq/L (ref 137–147)

## 2014-04-13 LAB — CBC
HEMATOCRIT: 34.5 % — AB (ref 39.0–52.0)
Hemoglobin: 11.6 g/dL — ABNORMAL LOW (ref 13.0–17.0)
MCH: 27.2 pg (ref 26.0–34.0)
MCHC: 33.6 g/dL (ref 30.0–36.0)
MCV: 81 fL (ref 78.0–100.0)
Platelets: 293 10*3/uL (ref 150–400)
RBC: 4.26 MIL/uL (ref 4.22–5.81)
RDW: 14 % (ref 11.5–15.5)
WBC: 8.7 10*3/uL (ref 4.0–10.5)

## 2014-04-13 LAB — GLUCOSE, CAPILLARY
GLUCOSE-CAPILLARY: 110 mg/dL — AB (ref 70–99)
GLUCOSE-CAPILLARY: 154 mg/dL — AB (ref 70–99)
Glucose-Capillary: 102 mg/dL — ABNORMAL HIGH (ref 70–99)
Glucose-Capillary: 195 mg/dL — ABNORMAL HIGH (ref 70–99)

## 2014-04-13 LAB — GENTAMICIN LEVEL, RANDOM: GENTAMICIN RM: 1.6 ug/mL

## 2014-04-13 MED ORDER — GENTAMICIN IN SALINE 1.6-0.9 MG/ML-% IV SOLN
80.0000 mg | Freq: Two times a day (BID) | INTRAVENOUS | Status: DC
  Administered 2014-04-14 (×2): 80 mg via INTRAVENOUS
  Filled 2014-04-13 (×3): qty 50

## 2014-04-13 NOTE — Progress Notes (Signed)
Came to visit patient to discuss [employer] services on behalf of [employer] insurance. He was sleeping soundly. Will will follow up at later time.  Marthenia Rolling, MSN- [facility] [phone]

## 2014-04-13 NOTE — Op Note (Signed)
NAMEMarland Kitchen  [name], [name] NO.:  [id number]  MEDICAL RECORD NO.:  [id number]  LOCATION:  18                         FACILITY:  [facility]  PHYSICIAN:  Marshall Cork. Jeffie Pollock, M.D.    DATE OF BIRTH:  1941-07-21  DATE OF PROCEDURE:  04/12/2014 DATE OF DISCHARGE:                              OPERATIVE REPORT   PROCEDURE:  Transurethral resection of prostate for unroofing of prostatic abscess.  PREOPERATIVE DIAGNOSIS:  Prostatic abscess.  POSTOPERATIVE DIAGNOSIS:  Prostatic abscess.  SURGEON:  Marshall Cork. Jeffie Pollock, MD  ANESTHESIA:  General.  SPECIMEN:  Prostate chips.  DRAINS:  A 22-French 3-way Foley catheter.  ESTIMATED BLOOD LOSS:  Minimal.  COMPLICATIONS:  None.  INDICATIONS:  Bob Vazquez is a 73 year old African American male who had the onset last week of some voiding difficulties and a left testicular swelling.  When he was seen in the emergency room, it was unclear what the cause of his pain was which was in his lower abdomen as well, so a CT scan was done, this demonstrated a cystic structure in the prostate, worrisome for prostatic abscess or necrotic neoplasm.  He presented to my office today with some increased left testicular swelling and pain, and a persistently abnormal urine on Cipro.  He had no fever though.  A transrectal ultrasound and bladder ultrasound in the office demonstrated a moderate PVR of 136 mL and a cystic structure in the left proximal prostate that was not a simple cyst.  There appeared to be dilation of the left ejaculatory duct and seminal vesicle as well, and this may be a cyst that has become secondarily infected.  It was felt that unroofing of the structure was indicated to facilitate his recovery.  FINDINGS OF PROCEDURE:  He was given gentamicin in the office and was given gentamicin and Ancef for preoperative antibiotics.  He was taken to the operating room where general anesthetic was induced.  He was placed in lithotomy position.   His perineum and genitalia were prepped with Betadine solution.  He was draped in usual sterile fashion.  Cystoscopy was performed using a 22-French scope and 12-degree lens. Examination revealed a normal urethra to the level of the membranous urethra where there was some stricturing, but the 22-French scope would pass.  The prostatic urethra had bilobar hyperplasia with a high bladder neck, but was otherwise unremarkable in appearance without asymmetric enlargement.  The scope was then passed into the bladder where it was noted to have turbid urine with erythematous mucosa from inflammation.  Ureteral orifices were not well seen.  No tumors were noted.  No stones were seen.  Once cystoscopy had been performed, the urethra was calibrated to 30- [place] with [employer] sounds.  After removal of the sound, the resectoscope sheath was inserted with the obturator and once the obturator was removed, there was prompt drainage of bloody purulent material.  The Iglesias resectoscope with a gyrus loop and 12-degree lens was then passed.  Irrigation was with normal saline.  At this point, I realized that the [employer] sounds had actually pierced into the cystic cavity and the tip of the scope was in the cavity and not in  the bladder.  I then re-negotiated the scope over the bladder neck into the bladder.  The cystic structure had a velvety red lining, consistent with inflammation and it was very friable and bled easily.  At this point, using the gyrus loop, I resected the bladder neck to smooth out the transition between the bladder and the cyst, also some proximal and lateral tissue was excised to try to eliminate some of the velvety friable tissue that was bleeding whenever it was touched.  Once this had been adequately managed, some additional hemostasis was performed at the base of the cystic area/abscess cavity, but I did not aggressively resect in this area.  At this point, the bladder  was evacuated free of all chips.  Inspection revealed no injury to the ureteral orifice and once hemostasis was assured and no chips were present, the resectoscope was removed and a 22- [place] 3-way Foley catheter was inserted with the aid of a catheter guide.  Once in the bladder, the balloon was filled with 30 mL sterile fluid and the guide was removed.  The catheter was irrigated with clear return and placed to continuous irrigation and straight drainage.  I did not do an overly aggressive resection as I wanted to limit my time because of the presence of infection.  There were no complications.     Marshall Cork. Jeffie Pollock, M.D.     JJW/MEDQ  D:  04/12/2014  T:  04/13/2014  Job:  [id number]

## 2014-04-13 NOTE — Progress Notes (Signed)
UR completed 

## 2014-04-13 NOTE — Progress Notes (Signed)
Patient ID: Bob Vazquez, male   DOB: June 16, 1941, 73 y.o.   MRN: [id number] 1 Day Post-Op  Subjective: Bob Vazquez is without complaints following TUR unroofing of a prostatic abscess which I believe was in a preexisting prostatic cyst.   His urine is clear and he is afebrile.  ROS:  Review of Systems  Constitutional: Negative for fever.  Gastrointestinal: Negative for abdominal pain.  Genitourinary: Negative for hematuria.    Anti-infectives: Anti-infectives   Start     Dose/Rate Route Frequency Ordered Stop   04/13/14 1400  gentamicin (GARAMYCIN) 400 mg in dextrose 5 % 100 mL IVPB     400 mg 110 mL/hr over 60 Minutes Intravenous  Once 04/12/14 2004     04/13/14 0600  ceFAZolin (ANCEF) IVPB 1 g/50 mL premix     1 g 100 mL/hr over 30 Minutes Intravenous Every 12 hours 04/12/14 1944     04/13/14 0400  ceFAZolin (ANCEF) IVPB 1 g/50 mL premix  Status:  Discontinued     1 g 100 mL/hr over 30 Minutes Intravenous Every 8 hours 04/12/14 1941 04/12/14 1944   04/12/14 2200  ceFAZolin (ANCEF) IVPB 1 g/50 mL premix  Status:  Discontinued     1 g 100 mL/hr over 30 Minutes Intravenous 3 times per day 04/12/14 1931 04/12/14 1941   04/12/14 1400  ceFAZolin (ANCEF) IVPB 2 g/50 mL premix     2 g 100 mL/hr over 30 Minutes Intravenous 30 min pre-op 04/12/14 1342 04/12/14 1713   04/12/14 1342  gentamicin (GARAMYCIN) 240 mg in dextrose 5 % 100 mL IVPB     240 mg 106 mL/hr over 60 Minutes Intravenous 30 min pre-op 04/12/14 1342 04/12/14 1735      Current Facility-Administered Medications  Medication Dose Route Frequency Provider Last Rate Last Dose  . 0.45 % NaCl with KCl 20 mEq / L infusion   Intravenous Continuous Irine Seal, MD 100 mL/hr at 04/13/14 3295    . acetaminophen (TYLENOL) tablet 650 mg  650 mg Oral Q4H PRN Irine Seal, MD      . amLODipine (NORVASC) tablet 5 mg  5 mg Oral Daily Irine Seal, MD   5 mg at 04/12/14 2123  . bisacodyl (DULCOLAX) suppository 10 mg  10 mg Rectal Daily PRN Irine Seal, MD      . ceFAZolin (ANCEF) IVPB 1 g/50 mL premix  1 g Intravenous Q12H Irine Seal, MD   1 g at 04/13/14 [phone]  . diphenhydrAMINE (BENADRYL) injection 12.5 mg  12.5 mg Intravenous Q6H PRN Irine Seal, MD       Or  . diphenhydrAMINE (BENADRYL) 12.5 MG/5ML elixir 12.5 mg  12.5 mg Oral Q6H PRN Irine Seal, MD      . docusate sodium (COLACE) capsule 100 mg  100 mg Oral BID Irine Seal, MD   100 mg at 04/12/14 2124  . fluticasone (FLONASE) 50 MCG/ACT nasal spray 2 spray  2 spray Each Nare Daily Irine Seal, MD   2 spray at 04/12/14 2123  . gentamicin (GARAMYCIN) 400 mg in dextrose 5 % 100 mL IVPB  400 mg Intravenous Once Mosetta Pigeon, [facility]      . glimepiride (AMARYL) tablet 1 mg  1 mg Oral QAC breakfast Irine Seal, MD      . HYDROcodone-acetaminophen (NORCO/VICODIN) 5-325 MG per tablet 2 tablet  2 tablet Oral Q4H PRN Irine Seal, MD   2 tablet at 04/13/14 0546  . HYDROmorphone (DILAUDID) injection 0.5-1 mg  0.5-1 mg Intravenous  Q2H PRN Irine Seal, MD      . hyoscyamine (LEVSIN SL) SL tablet 0.125 mg  0.125 mg Oral Q4H PRN Irine Seal, MD   0.125 mg at 04/12/14 2124  . insulin aspart (novoLOG) injection 0-15 Units  0-15 Units Subcutaneous TID WC Irine Seal, MD      . insulin aspart (novoLOG) injection 4 Units  4 Units Subcutaneous TID WC Irine Seal, MD      . loratadine (CLARITIN) tablet 10 mg  10 mg Oral Daily Irine Seal, MD   10 mg at 04/12/14 2123  . menthol-cetylpyridinium (CEPACOL) lozenge 3 mg  1 lozenge Oral PRN Irine Seal, MD      . ondansetron [facility]) injection 4 mg  4 mg Intravenous Q4H PRN Irine Seal, MD      . phenol [facility]) mouth spray 1 spray  1 spray Mouth/Throat PRN Irine Seal, MD      . simvastatin (ZOCOR) tablet 20 mg  20 mg Oral Daily Irine Seal, MD   20 mg at 04/12/14 2123  . sodium chloride irrigation 0.9 % 3,000 mL  3,000 mL Irrigation Continuous Irine Seal, MD   3,000 mL at 04/12/14 2158     Objective: Vital signs in last 24 hours: Temp:  [97.4 F (36.3 C)-98.3 F  (36.8 C)] 98.1 F (36.7 C) (04/21 0541) Pulse Rate:  [76-103] 98 (04/21 0541) Resp:  [10-18] 18 (04/21 0541) BP: (112-164)/(54-88) 112/54 mmHg (04/21 0541) SpO2:  [94 %-100 %] 100 % (04/21 0541) Weight:  [81.64 kg (179 lb 15.7 oz)-81.647 kg (180 lb)] 81.64 kg (179 lb 15.7 oz) (04/20 2100)  Intake/Output from previous day: 04/20 0701 - 04/21 0700 In: 2720 [P.O.:20; I.V.:900] Out: 4275 [Urine:4275] Intake/Output this shift: Total I/O In: 1520 [P.O.:20; Other:1500] Out: 3850 [Urine:3850]   Physical Exam  Constitutional: He is well-developed, well-nourished, and in no distress.  Abdominal: Soft. He exhibits no distension. There is no tenderness. There is no guarding.    Lab Results:   Recent Labs  04/13/14 0340  WBC 8.7  HGB 11.6*  HCT 34.5*  PLT 293   BMET  Recent Labs  04/12/14 1330 04/13/14 0340  NA 141 140  K 4.2 4.4  CL 99 103  CO2 27 25  GLUCOSE 83 62*  BUN 30* 23  CREATININE 2.38* 1.78*  CALCIUM 9.8 8.8   PT/INR No results found for this basename: LABPROT, INR,  in the last 72 hours ABG No results found for this basename: PHART, PCO2, PO2, HCO3,  in the last 72 hours  Studies/Results: Dg Chest 2 View  04/12/2014   CLINICAL DATA:  Abscess.  EXAM: CHEST  2 VIEW  COMPARISON:  Chest x-ray 07/31/2012.  FINDINGS: Ventriculoperitoneal shunt tubing noted. Mediastinum hilar structures normal. Lungs are clear. Heart size normal.  IMPRESSION: No active cardiopulmonary disease.  Stable chest from prior exam.   Electronically Signed   By: Cowen   On: 04/12/2014 16:00    Labs reviewed.   His Cr is falling and his WBC count remains normal.     Assessment: s/p Procedure(s): TRANSURETHRAL RESECTION OF THE PROSTATE (TURP) Abscess  He is doing well post op   Plan: I will D/C the [facility] and give a voiding trial in the morning. Continue IV antibiotics.     LOS: 1 day    Irine Seal 04/13/2014

## 2014-04-13 NOTE — Progress Notes (Signed)
Inpatient Diabetes Program Recommendations  AACE/ADA: New Consensus Statement on Inpatient Glycemic Control (2013)  Target Ranges:  Prepandial:   less than 140 mg/dL      Peak postprandial:   less than 180 mg/dL (1-2 hours)      Critically ill patients:  140 - 180 mg/dL   Reason for Visit: Hypoglycemia  Diabetes history: DM2 Outpatient Diabetes medications: Lantus 60 units QD if blood sugar <150 mg/dL Current orders for Inpatient glycemic control: Novolog 4 units tidwc and Novolog moderate tidwc  Inpatient Diabetes Program Recommendations Insulin - Basal: Lantus 30 units QHS - (1/2 home dose) HgbA1C: Check HgbA1C to assess glycemic control prior to hospitalization D/C Amaryl while in hospital to prevent hypoglycemia Decrease Novolog to sensitive tidwc  Pt states he takes Lantus 60 units if blood sugar is >150 mg/dL per PCP.  Needs education regarding insulin orders prior to discharge. Checks blood sugars daily and takes to PCP who manages DM.  Will follow-up in am regarding insulin orders. Thank you. Lorenda Peck, RD, LDN, CDE Inpatient Diabetes Coordinator [phone]

## 2014-04-13 NOTE — Care Management Note (Signed)
    Page 1 of 1   04/13/2014     11:09:07 AM CARE MANAGEMENT NOTE 04/13/2014  Patient:  Bob Vazquez, Bob Vazquez   Account Number:  [id number]  Date Initiated:  04/13/2014  Documentation initiated by:  Dessa Phi  Subjective/Objective Assessment:   73 Y/O M ADMITTED W/PROSTATE CA.     Action/Plan:   FROM HOME.   Anticipated DC Date:  04/14/2014   Anticipated DC Plan:  [place]  CM consult      Choice offered to / List presented to:             Status of service:  In process, will continue to follow Medicare Important Message given?   (If response is "NO", the following Medicare IM given date fields will be blank) Date Medicare IM given:   Date Additional Medicare IM given:    Discharge Disposition:    Per UR Regulation:  Reviewed for med. necessity/level of care/duration of stay  If discussed at [place] of Stay Meetings, dates discussed:    Comments:  04/13/14 Evamaria Detore RN,BSN NCM [phone] S/P TURP/CBI.NO ANTICIPATED D/C NEEDS.

## 2014-04-13 NOTE — Progress Notes (Signed)
ANTIBIOTIC CONSULT NOTE - FOLLOW UP  Pharmacy Consult for gentamicin, renal antibiotic adjustment Indication: UTI / prostate abscess  Allergies  Allergen Reactions  . Penicillins Swelling    Childhood reaction; "swelled my arm up"    Patient Measurements: Height: 5\' 11"  (180.3 cm) Weight: 179 lb 15.7 oz (81.64 kg) IBW/kg (Calculated) : 75.3  Vital Signs: Temp: 98.4 F (36.9 C) (04/21 1402) Temp src: Oral (04/21 1402) BP: 121/57 mmHg (04/21 1402) Pulse Rate: 80 (04/21 1402)  Labs:  Recent Labs  04/12/14 1330 04/13/14 0340  WBC  --  8.7  HGB  --  11.6*  PLT  --  293  CREATININE 2.38* 1.78*   Estimated Creatinine Clearance: 38.2 ml/min (by C-G formula based on Cr of 1.78).  Microbiology: No results found for this or any previous visit (from the past 720 hour(s)).  Medical History: Past Medical History  Diagnosis Date  . Hypertension   . Hyperlipemia   . NPH (normal pressure hydrocephalus)   . Chronic bronchitis   . Type II diabetes mellitus   . Migraines     "q now and then"  . Prostatic abscess 04/12/14    transurethral resection of prostatic abscess    Medications:  Scheduled:  . amLODipine  5 mg Oral Daily  .  ceFAZolin (ANCEF) IV  1 g Intravenous Q12H  . docusate sodium  100 mg Oral BID  . fluticasone  2 spray Each Nare Daily  . glimepiride  1 mg Oral QAC breakfast  . insulin aspart  0-15 Units Subcutaneous TID WC  . insulin aspart  4 Units Subcutaneous TID WC  . loratadine  10 mg Oral Daily  . simvastatin  20 mg Oral Daily   Infusions:  . 0.45 % NaCl with KCl 20 mEq / L 100 mL/hr at 04/13/14 1629  . sodium chloride irrigation     Assessment: 70 yoM admitted 4/20 for transurethral resection of prostatic abscess. Pharmacy has been consulted to dose gentamicin and renally adjust cefazolin post-operatively. Noted patient with a course of ciprofloxacin started 4/14 prior to admission  Day #2 Gentamicin with MD note to continue IV  antibiotics  Random Gentamicin level @ 18:00 today = 1.6 mcg/ml  (Patient received Gent 160mg  @ 10am on 4/20 and 240mg  @ 17:35 on 4/20)  Level is 24 hr post last dose  Scr improving 2.38 > 1.78 with CrCl currently 38 ml/min  Due to level being > 1 24 hr after dose and poor renal function, will continue to dose Gentamicing with traditional dosing using IBW (75 kg)  Antiinfectives 4/20 >> cefazolin >> 4/20 >> gentamicin >>    Labs / vitals Tmax: afebrile WBCs: WNL  No culture data from this admission   Goal of Therapy:  Gentamicin trough level <2 mcg/ml appropriate dosing of cefazolin for indication and renal function  Plan:  - Gentamicin 80mg  IV q12h for treatment of UTI and prostatic abscess - Follow plan for LOT and check peak/trough levels as appropriate - Will check BMET daily while on Gentamicin - change cefazolin to 1g IV q12h   Leone Haven, PharmD  04/13/2014 7:38 PM

## 2014-04-14 LAB — BASIC METABOLIC PANEL
BUN: 19 mg/dL (ref 6–23)
CHLORIDE: 102 meq/L (ref 96–112)
CO2: 26 meq/L (ref 19–32)
CREATININE: 1.59 mg/dL — AB (ref 0.50–1.35)
Calcium: 8.9 mg/dL (ref 8.4–10.5)
GFR calc non Af Amer: 41 mL/min — ABNORMAL LOW (ref >90)
GFR, EST AFRICAN AMERICAN: 47 mL/min — AB (ref >90)
Glucose, Bld: 192 mg/dL — ABNORMAL HIGH (ref 70–99)
POTASSIUM: 5.5 meq/L — AB (ref 3.7–5.3)
SODIUM: 137 meq/L (ref 137–147)

## 2014-04-14 LAB — GLUCOSE, CAPILLARY
Glucose-Capillary: 132 mg/dL — ABNORMAL HIGH (ref 70–99)
Glucose-Capillary: 136 mg/dL — ABNORMAL HIGH (ref 70–99)

## 2014-04-14 NOTE — Progress Notes (Signed)
Patient discharged home, discharge instructions given and explained to patient and he verbalized understanding, denies any pain/distress. No wound noted, skin intact. Accompanied home by brother.

## 2014-04-14 NOTE — Progress Notes (Signed)
Returned to bedside today to offer and explain [place] [facility] services on behalf of patient's [employer] insurance. Patient will receive a post hospital discharge call and will be evaluated for monthly home visits. Consents were obtained. Confirmed contact information and left [facility] Care Management packet at bedside.  Marthenia Rolling, MSN-Ed, RN,BSN- [facility] [place] [phone]

## 2014-04-14 NOTE — Discharge Instructions (Addendum)
Transurethral Resection of the Prostate Care After Refer to this sheet in the next few weeks. These instructions provide you with information on caring for yourself after your procedure. Your caregiver also may give you specific instructions. Your treatment has been planned according to current medical practices, but complications sometimes occur. Call your caregiver if you have any problems or questions after your procedure. HOME CARE INSTRUCTIONS  Recovery can take 4 6 weeks. Avoid alcohol, caffeinated drinks, and spicy foods for 2 weeks after your procedure. Drink enough fluids to keep your urine clear or pale yellow. Urinate as soon as you feel the urge to do so. Do not try to hold your urine for long periods of time. During recovery you may experience pain caused by bladder spasms, which result in a very intense urge to urinate. Take all medicines as directed by your caregiver, including medicines for pain. Try to limit the amount of pain medicines you take because it can cause constipation. If you do become constipated, do not strain to move your bowels. Straining can increase bleeding. Constipation can be minimized by increasing the amount fluids and fiber in your diet. Your caregiver also may prescribe a stool softener. Do not lift heavy objects (more than 5 lb [2.25 kg]) or perform exercises that cause you to strain for at least 1 month after your procedure. When sitting, you may want to sit in a soft chair or use a cushion. For the first 10 days after your procedure, avoid the following activities:  Running.  Strenuous work.  Long walks.  Riding in a car for extended periods.  Sex. SEEK MEDICAL CARE IF:  You have difficulty urinating.  You have blood in your urine that does not go away after you rest or increase your fluid intake.  You have swelling in your penis or scrotum. SEEK IMMEDIATE MEDICAL CARE IF:   You are suddenly unable to urinate.  You notice blood clots in your  urine.  You have chills.  You have a fever.  You have pain in your back or lower abdomen.  You have pain or swelling in your legs. MAKE SURE YOU:   Understand these instructions.  Will watch your condition.  Will get help right away if you are not doing well or get worse. Document Released: 12/10/2005 Document Revised: 09/03/2012 Document Reviewed: 01/18/2012 [facility] Patient Information 2014 [place].   I am waiting for his urine culture from our office.   The culture was positive so he will need additional antibiotic but I will have to send the medication in later today when I get that result.

## 2014-04-14 NOTE — Discharge Summary (Signed)
Physician Discharge Summary  Patient ID: Bob Vazquez MRN: [id number] DOB/AGE: 75-10-1941 75 y.o.  Admit date: 04/12/2014 Discharge date: 04/14/2014  Admission Diagnoses:  Prostatic abscess Left epididymitis  Discharge Diagnoses:  Principal Problem:   Prostatic abscess Left epdidiymitis  Past Medical History  Diagnosis Date  . Hypertension   . Hyperlipemia   . NPH (normal pressure hydrocephalus)   . Chronic bronchitis   . Type II diabetes mellitus   . Migraines     "q now and then"  . Prostatic abscess 04/12/14    transurethral resection of prostatic abscess    Surgeries: Procedure(s): TRANSURETHRAL RESECTION OF THE PROSTATE (TURP) Abscess on 04/12/2014   Consultants (if any):    Discharged Condition: Improved  Hospital Course: Bob Vazquez is an 73 y.o. male who was admitted 04/12/2014 with a diagnosis of Prostatic abscess and went to the operating room on 04/12/2014 and underwent the above named procedures.  He also has left epididymitis related to the infection.   He did well post op and remained afebrile on Gent and Ancef.  His foley was removed this morning and he voided well.  The office culture grew a bacteria resistent to the Cipro he was on prior to admission.  I have sent a script for bactrim to his pharmacy.    He was given perioperative antibiotics:  Anti-infectives   Start     Dose/Rate Route Frequency Ordered Stop   04/13/14 2359  gentamicin (GARAMYCIN) IVPB 80 mg  Status:  Discontinued     80 mg 100 mL/hr over 30 Minutes Intravenous Every 12 hours 04/13/14 1945 04/14/14 1640   04/13/14 1400  gentamicin (GARAMYCIN) 400 mg in dextrose 5 % 100 mL IVPB  Status:  Discontinued     400 mg 110 mL/hr over 60 Minutes Intravenous  Once 04/12/14 2004 04/13/14 1144   04/13/14 0600  ceFAZolin (ANCEF) IVPB 1 g/50 mL premix  Status:  Discontinued     1 g 100 mL/hr over 30 Minutes Intravenous Every 12 hours 04/12/14 1944 04/14/14 1640   04/13/14 0400  ceFAZolin (ANCEF)  IVPB 1 g/50 mL premix  Status:  Discontinued     1 g 100 mL/hr over 30 Minutes Intravenous Every 8 hours 04/12/14 1941 04/12/14 1944   04/12/14 2200  ceFAZolin (ANCEF) IVPB 1 g/50 mL premix  Status:  Discontinued     1 g 100 mL/hr over 30 Minutes Intravenous 3 times per day 04/12/14 1931 04/12/14 1941   04/12/14 1400  ceFAZolin (ANCEF) IVPB 2 g/50 mL premix     2 g 100 mL/hr over 30 Minutes Intravenous 30 min pre-op 04/12/14 1342 04/12/14 1713   04/12/14 1342  gentamicin (GARAMYCIN) 240 mg in dextrose 5 % 100 mL IVPB     240 mg 106 mL/hr over 60 Minutes Intravenous 30 min pre-op 04/12/14 1342 04/12/14 1735    .  He was given sequential compression devices and early ambulation for DVT prophylaxis.  He benefited maximally from the hospital stay and there were no complications.    Recent vital signs:  Filed Vitals:   04/14/14 0906  BP: 123/46  Pulse:   Temp:   Resp:     Recent laboratory studies:  Lab Results  Component Value Date   HGB 11.6* 04/13/2014   HGB 11.7* 04/06/2014   HGB 12.1* 08/01/2012   Lab Results  Component Value Date   WBC 8.7 04/13/2014   PLT 293 04/13/2014   No results found for this basename: INR  Lab Results  Component Value Date   NA 137 04/14/2014   K 5.5* 04/14/2014   CL 102 04/14/2014   CO2 26 04/14/2014   BUN 19 04/14/2014   CREATININE 1.59* 04/14/2014   GLUCOSE 192* 04/14/2014    Discharge Medications:     Medication List    STOP taking these medications       ciprofloxacin 500 MG tablet  Commonly known as:  CIPRO      TAKE these medications       amLODipine 5 MG tablet  Commonly known as:  NORVASC  Take 5 mg by mouth daily.     aspirin EC 81 MG tablet  Take 81 mg by mouth at bedtime.     Cetirizine HCl 10 MG Caps  Take 10 mg by mouth daily.     fluticasone 50 MCG/ACT nasal spray  Commonly known as:  FLONASE  Place 2 sprays into both nostrils daily.     glimepiride 1 MG tablet  Commonly known as:  AMARYL  Take 1 mg by mouth  daily before breakfast.     HYDROcodone-acetaminophen 5-325 MG per tablet  Commonly known as:  NORCO/VICODIN  Take 2 tablets by mouth every 4 (four) hours as needed for moderate pain.     insulin glargine 100 UNIT/ML injection  Commonly known as:  LANTUS  Inject 60 Units into the skin daily with breakfast.     simvastatin 20 MG tablet  Commonly known as:  ZOCOR  Take 20 mg by mouth daily.        Diagnostic Studies: Ct Abdomen Pelvis Wo Contrast  04/06/2014   CLINICAL DATA:  Left groin pain.  EXAM: CT ABDOMEN AND PELVIS WITHOUT CONTRAST  TECHNIQUE: Multidetector CT imaging of the abdomen and pelvis was performed following the standard protocol without intravenous contrast.  COMPARISON:  US SCROTUM dated 04/06/2014  FINDINGS: Mild cylindrical bronchiectasis present of both lower lobes.  The noncontrast CT appearance of the liver, spleen, pancreas, and adrenal glands is within normal limits. Gallbladder appears contracted.  Small retroperitoneal lymph nodes are not pathologically enlarged by size criteria.  Noncontrast CT appearance of the kidneys an ureters is normal.  Right external iliac lymph node 0.7 cm in short axis.  Appendix normal. A ventriculoperitoneal shunt catheter terminates in the left upper quadrant, without surrounding fluid collection.  There is an abnormal appearance of the prostate gland in the seminal vesicles, with a hypodense 2.6 cm in diameter lesion anteriorly along the prostate base indenting the bladder, and with the left seminal vesicle larger than the right and more hypodense. Mild periprostatic stranding.  The left varicocele is again noted in the scrotum. Small bilateral inguinal hernias contain adipose tissue and there is low grade stranding in the subcutaneous tissues along the pubis and inguinal canals of uncertain significance. Small bilateral inguinal lymph nodes may be reactive.  Prominent epidural adipose tissue in the lower lumbar spine. Partially calcified right  eccentric disc protrusion at L5-S1 abuts but does not compress the right S1 nerve roots in the lateral recess.  Borderline rectal wall thickening. Bridging spurring of the right sacroiliac joint. Degenerative arthropathy of both hips.  IMPRESSION: 1. Periprosthetic stranding with asymmetry of the seminal vesicles (left larger than right) and a 2.6 cm low-density lesion anteriorly in the base of the prostate gland. If the patient has had prior TURP, that may partially explain the low-density prostate lesion, but the possibility of prostate cancer with central necrosis, or prostatitis/prostate abscess must also be  considered. 2. Borderline rectal wall thickening could reflect low grade inflammation. 3. Small bilateral inguinal hernias containing adipose tissue, with regional stranding in the inguinal canals and pubis potentially reflecting low grade regional inflammation. Left varicocele is again noted. 4. Various incidental findings not thought to contribute to the patient's current symptoms include cylindrical bronchiectasis in the lower lobes; ventriculoperitoneal shunt catheter ; lumbar degenerative disc disease and spurring of the right sacroiliac joint ; and bilateral degenerative hip arthropathy.   Electronically Signed   By: Sherryl Barters M.D.   On: 04/06/2014 19:19   Dg Chest 2 View  04/12/2014   CLINICAL DATA:  Abscess.  EXAM: CHEST  2 VIEW  COMPARISON:  Chest x-ray 07/31/2012.  FINDINGS: Ventriculoperitoneal shunt tubing noted. Mediastinum hilar structures normal. Lungs are clear. Heart size normal.  IMPRESSION: No active cardiopulmonary disease.  Stable chest from prior exam.   Electronically Signed   By: Marcello Moores  Register   On: 04/12/2014 16:00   US Scrotum  04/06/2014   CLINICAL DATA:  Left scrotal swelling  EXAM: SCROTAL ULTRASOUND  DOPPLER ULTRASOUND OF THE TESTICLES  TECHNIQUE: Complete ultrasound examination of the testicles, epididymis, and other scrotal structures was performed. Color and  spectral Doppler ultrasound were also utilized to evaluate blood flow to the testicles.  COMPARISON:  None.  FINDINGS: Right testicle  Measurements: 3.7 x 1.7 x 2.4 cm. 0.8 x 0.5 x 0.8 peripheral anechoic structure superiorly in the right testicle compatible with testicular cyst. Enhance through transmission observed. Right testicle otherwise unremarkable.  Left testicle  Measurements: 3.2 x 1.6 x 2.3 cm. Mild ectasia of the rete testis as shown on image 60. No mass or microlithiasis visualized.  Right epididymis:  Normal in size and appearance.  Left epididymis:  Normal in size and appearance.  Hydrocele:  None visualized.  Varicocele:  Small left varicocele.  Pulsed Doppler interrogation of both testes demonstrates low resistance arterial and venous waveforms bilaterally.  Soft tissue fullness in the left inguinal canal questionable for hernia, although peristalsis was not directly visualized.  IMPRESSION: 1. No compelling findings of orchitis or testicular mass. 2. Small benign cyst of the right testicle requires no further workup. 3. Mild ectasia of the left reach testis.  This is benign. 4. Small left varicocele. 5. Fullness in the left inguinal canal region suspicious for herniation. No definite peristalsis to suggest herniation of bowel; this could represent herniation of adipose tissue. CT of the pelvis could be utilized for further characterization.   Electronically Signed   By: Sherryl Barters M.D.   On: 04/06/2014 18:07   [place] Art/ven Flow Abd Pelv Doppler  04/06/2014   CLINICAL DATA:  Left scrotal swelling  EXAM: SCROTAL ULTRASOUND  DOPPLER ULTRASOUND OF THE TESTICLES  TECHNIQUE: Complete ultrasound examination of the testicles, epididymis, and other scrotal structures was performed. Color and spectral Doppler ultrasound were also utilized to evaluate blood flow to the testicles.  COMPARISON:  None.  FINDINGS: Right testicle  Measurements: 3.7 x 1.7 x 2.4 cm. 0.8 x 0.5 x 0.8 peripheral anechoic  structure superiorly in the right testicle compatible with testicular cyst. Enhance through transmission observed. Right testicle otherwise unremarkable.  Left testicle  Measurements: 3.2 x 1.6 x 2.3 cm. Mild ectasia of the rete testis as shown on image 60. No mass or microlithiasis visualized.  Right epididymis:  Normal in size and appearance.  Left epididymis:  Normal in size and appearance.  Hydrocele:  None visualized.  Varicocele:  Small left varicocele.  Pulsed Doppler  interrogation of both testes demonstrates low resistance arterial and venous waveforms bilaterally.  Soft tissue fullness in the left inguinal canal questionable for hernia, although peristalsis was not directly visualized.  IMPRESSION: 1. No compelling findings of orchitis or testicular mass. 2. Small benign cyst of the right testicle requires no further workup. 3. Mild ectasia of the left reach testis.  This is benign. 4. Small left varicocele. 5. Fullness in the left inguinal canal region suspicious for herniation. No definite peristalsis to suggest herniation of bowel; this could represent herniation of adipose tissue. CT of the pelvis could be utilized for further characterization.   Electronically Signed   By: Sherryl Barters M.D.   On: 04/06/2014 18:07    Disposition: 01-Home or Self Care        Follow-up Information   Follow up with Anders Grant, NP On 04/21/2014. [phone])    Specialty:  Urology   Contact information:   [facility]  [place] [address] [phone]        Signed: Irine Seal 04/14/2014, 5:44 PM

## 2014-09-08 ENCOUNTER — Ambulatory Visit (HOSPITAL_COMMUNITY)
Admission: RE | Admit: 2014-09-08 | Discharge: 2014-09-08 | Disposition: A | Discharge status: Home / Self Care | Payer: Medicare PPO | Source: Ambulatory Visit | Attending: Internal Medicine | Admitting: Internal Medicine

## 2014-09-08 ENCOUNTER — Other Ambulatory Visit (HOSPITAL_COMMUNITY): Payer: Self-pay | Admitting: Internal Medicine

## 2014-09-08 DIAGNOSIS — R059 Cough, unspecified: Secondary | ICD-10-CM | POA: Diagnosis present

## 2014-09-08 DIAGNOSIS — R05 Cough: Secondary | ICD-10-CM

## 2019-07-13 ENCOUNTER — Other Ambulatory Visit: Payer: Self-pay | Admitting: Internal Medicine

## 2019-07-13 DIAGNOSIS — Z20822 Contact with and (suspected) exposure to covid-19: Secondary | ICD-10-CM

## 2019-07-16 LAB — NOVEL CORONAVIRUS, NAA: SARS-CoV-2, NAA: NOT DETECTED

## 2019-08-04 ENCOUNTER — Telehealth: Payer: Self-pay | Admitting: Internal Medicine

## 2019-08-04 NOTE — Telephone Encounter (Signed)
Negative COVID results given. Patient results "NOT Detected." Caller expressed understanding. ° °

## 2020-01-29 ENCOUNTER — Emergency Department (HOSPITAL_COMMUNITY): Payer: Medicare Other

## 2020-01-29 ENCOUNTER — Inpatient Hospital Stay (HOSPITAL_COMMUNITY)
Admission: EM | Admit: 2020-01-29 | Discharge: 2020-02-22 | DRG: 871 | Disposition: E | Payer: Medicare Other | Attending: Internal Medicine | Admitting: Internal Medicine

## 2020-01-29 ENCOUNTER — Encounter (HOSPITAL_COMMUNITY): Payer: Self-pay

## 2020-01-29 ENCOUNTER — Other Ambulatory Visit: Payer: Self-pay

## 2020-01-29 DIAGNOSIS — D62 Acute posthemorrhagic anemia: Secondary | ICD-10-CM | POA: Diagnosis not present

## 2020-01-29 DIAGNOSIS — Z982 Presence of cerebrospinal fluid drainage device: Secondary | ICD-10-CM

## 2020-01-29 DIAGNOSIS — K921 Melena: Secondary | ICD-10-CM | POA: Diagnosis not present

## 2020-01-29 DIAGNOSIS — M6282 Rhabdomyolysis: Secondary | ICD-10-CM | POA: Diagnosis present

## 2020-01-29 DIAGNOSIS — R509 Fever, unspecified: Secondary | ICD-10-CM | POA: Diagnosis not present

## 2020-01-29 DIAGNOSIS — Z515 Encounter for palliative care: Secondary | ICD-10-CM | POA: Diagnosis not present

## 2020-01-29 DIAGNOSIS — N12 Tubulo-interstitial nephritis, not specified as acute or chronic: Secondary | ICD-10-CM | POA: Diagnosis present

## 2020-01-29 DIAGNOSIS — D509 Iron deficiency anemia, unspecified: Secondary | ICD-10-CM | POA: Diagnosis present

## 2020-01-29 DIAGNOSIS — R6520 Severe sepsis without septic shock: Secondary | ICD-10-CM | POA: Diagnosis present

## 2020-01-29 DIAGNOSIS — G9341 Metabolic encephalopathy: Secondary | ICD-10-CM | POA: Diagnosis not present

## 2020-01-29 DIAGNOSIS — E11649 Type 2 diabetes mellitus with hypoglycemia without coma: Secondary | ICD-10-CM | POA: Diagnosis present

## 2020-01-29 DIAGNOSIS — Z9079 Acquired absence of other genital organ(s): Secondary | ICD-10-CM

## 2020-01-29 DIAGNOSIS — K209 Esophagitis, unspecified without bleeding: Secondary | ICD-10-CM | POA: Diagnosis not present

## 2020-01-29 DIAGNOSIS — N1832 Chronic kidney disease, stage 3b: Secondary | ICD-10-CM | POA: Diagnosis present

## 2020-01-29 DIAGNOSIS — A419 Sepsis, unspecified organism: Secondary | ICD-10-CM | POA: Diagnosis not present

## 2020-01-29 DIAGNOSIS — K254 Chronic or unspecified gastric ulcer with hemorrhage: Secondary | ICD-10-CM | POA: Diagnosis present

## 2020-01-29 DIAGNOSIS — Z66 Do not resuscitate: Secondary | ICD-10-CM | POA: Diagnosis not present

## 2020-01-29 DIAGNOSIS — E1122 Type 2 diabetes mellitus with diabetic chronic kidney disease: Secondary | ICD-10-CM | POA: Diagnosis present

## 2020-01-29 DIAGNOSIS — R0602 Shortness of breath: Secondary | ICD-10-CM

## 2020-01-29 DIAGNOSIS — N179 Acute kidney failure, unspecified: Secondary | ICD-10-CM | POA: Diagnosis present

## 2020-01-29 DIAGNOSIS — U071 COVID-19: Secondary | ICD-10-CM | POA: Diagnosis not present

## 2020-01-29 DIAGNOSIS — Z79899 Other long term (current) drug therapy: Secondary | ICD-10-CM

## 2020-01-29 DIAGNOSIS — J1282 Pneumonia due to coronavirus disease 2019: Secondary | ICD-10-CM | POA: Diagnosis present

## 2020-01-29 DIAGNOSIS — R238 Other skin changes: Secondary | ICD-10-CM

## 2020-01-29 DIAGNOSIS — E785 Hyperlipidemia, unspecified: Secondary | ICD-10-CM | POA: Diagnosis present

## 2020-01-29 DIAGNOSIS — Z23 Encounter for immunization: Secondary | ICD-10-CM

## 2020-01-29 DIAGNOSIS — Z79891 Long term (current) use of opiate analgesic: Secondary | ICD-10-CM

## 2020-01-29 DIAGNOSIS — E875 Hyperkalemia: Secondary | ICD-10-CM | POA: Diagnosis present

## 2020-01-29 DIAGNOSIS — K264 Chronic or unspecified duodenal ulcer with hemorrhage: Secondary | ICD-10-CM | POA: Diagnosis not present

## 2020-01-29 DIAGNOSIS — R000 Tachycardia, unspecified: Secondary | ICD-10-CM | POA: Diagnosis present

## 2020-01-29 DIAGNOSIS — R0603 Acute respiratory distress: Secondary | ICD-10-CM

## 2020-01-29 DIAGNOSIS — I10 Essential (primary) hypertension: Secondary | ICD-10-CM | POA: Diagnosis not present

## 2020-01-29 DIAGNOSIS — K2210 Ulcer of esophagus without bleeding: Secondary | ICD-10-CM | POA: Diagnosis present

## 2020-01-29 DIAGNOSIS — A4189 Other specified sepsis: Secondary | ICD-10-CM | POA: Diagnosis not present

## 2020-01-29 DIAGNOSIS — Z794 Long term (current) use of insulin: Secondary | ICD-10-CM

## 2020-01-29 DIAGNOSIS — R1310 Dysphagia, unspecified: Secondary | ICD-10-CM | POA: Diagnosis not present

## 2020-01-29 DIAGNOSIS — K222 Esophageal obstruction: Secondary | ICD-10-CM | POA: Diagnosis present

## 2020-01-29 DIAGNOSIS — J9601 Acute respiratory failure with hypoxia: Secondary | ICD-10-CM | POA: Diagnosis not present

## 2020-01-29 DIAGNOSIS — K922 Gastrointestinal hemorrhage, unspecified: Secondary | ICD-10-CM | POA: Diagnosis not present

## 2020-01-29 DIAGNOSIS — J42 Unspecified chronic bronchitis: Secondary | ICD-10-CM | POA: Diagnosis present

## 2020-01-29 DIAGNOSIS — E872 Acidosis: Secondary | ICD-10-CM | POA: Diagnosis not present

## 2020-01-29 DIAGNOSIS — Z7982 Long term (current) use of aspirin: Secondary | ICD-10-CM

## 2020-01-29 DIAGNOSIS — N390 Urinary tract infection, site not specified: Secondary | ICD-10-CM | POA: Diagnosis not present

## 2020-01-29 DIAGNOSIS — G43909 Migraine, unspecified, not intractable, without status migrainosus: Secondary | ICD-10-CM | POA: Diagnosis present

## 2020-01-29 DIAGNOSIS — N3000 Acute cystitis without hematuria: Secondary | ICD-10-CM | POA: Diagnosis not present

## 2020-01-29 DIAGNOSIS — I129 Hypertensive chronic kidney disease with stage 1 through stage 4 chronic kidney disease, or unspecified chronic kidney disease: Secondary | ICD-10-CM | POA: Diagnosis present

## 2020-01-29 DIAGNOSIS — B9689 Other specified bacterial agents as the cause of diseases classified elsewhere: Secondary | ICD-10-CM | POA: Diagnosis not present

## 2020-01-29 DIAGNOSIS — K2100 Gastro-esophageal reflux disease with esophagitis, without bleeding: Secondary | ICD-10-CM | POA: Diagnosis present

## 2020-01-29 DIAGNOSIS — E1110 Type 2 diabetes mellitus with ketoacidosis without coma: Secondary | ICD-10-CM | POA: Diagnosis not present

## 2020-01-29 DIAGNOSIS — G912 (Idiopathic) normal pressure hydrocephalus: Secondary | ICD-10-CM | POA: Diagnosis present

## 2020-01-29 DIAGNOSIS — E876 Hypokalemia: Secondary | ICD-10-CM | POA: Diagnosis not present

## 2020-01-29 DIAGNOSIS — G9340 Encephalopathy, unspecified: Secondary | ICD-10-CM | POA: Diagnosis not present

## 2020-01-29 DIAGNOSIS — R1030 Lower abdominal pain, unspecified: Secondary | ICD-10-CM

## 2020-01-29 DIAGNOSIS — Z9114 Patient's other noncompliance with medication regimen: Secondary | ICD-10-CM

## 2020-01-29 LAB — RESPIRATORY PANEL BY RT PCR (FLU A&B, COVID)
Influenza A by PCR: NEGATIVE
Influenza B by PCR: NEGATIVE
SARS Coronavirus 2 by RT PCR: POSITIVE — AB

## 2020-01-29 LAB — POCT I-STAT EG7
Acid-base deficit: 7 mmol/L — ABNORMAL HIGH (ref 0.0–2.0)
Bicarbonate: 20.6 mmol/L (ref 20.0–28.0)
Calcium, Ion: 1.01 mmol/L — ABNORMAL LOW (ref 1.15–1.40)
HCT: 44 % (ref 39.0–52.0)
Hemoglobin: 15 g/dL (ref 13.0–17.0)
O2 Saturation: 92 %
Potassium: 6.8 mmol/L (ref 3.5–5.1)
Sodium: 128 mmol/L — ABNORMAL LOW (ref 135–145)
TCO2: 22 mmol/L (ref 22–32)
pCO2, Ven: 47.5 mmHg (ref 44.0–60.0)
pH, Ven: 7.246 — ABNORMAL LOW (ref 7.250–7.430)
pO2, Ven: 76 mmHg — ABNORMAL HIGH (ref 32.0–45.0)

## 2020-01-29 LAB — CBC WITH DIFFERENTIAL/PLATELET
Abs Immature Granulocytes: 0.12 10*3/uL — ABNORMAL HIGH (ref 0.00–0.07)
Basophils Absolute: 0 10*3/uL (ref 0.0–0.1)
Basophils Relative: 0 %
Eosinophils Absolute: 0 10*3/uL (ref 0.0–0.5)
Eosinophils Relative: 0 %
HCT: 45 % (ref 39.0–52.0)
Hemoglobin: 14.1 g/dL (ref 13.0–17.0)
Immature Granulocytes: 1 %
Lymphocytes Relative: 3 %
Lymphs Abs: 0.4 10*3/uL — ABNORMAL LOW (ref 0.7–4.0)
MCH: 27.6 pg (ref 26.0–34.0)
MCHC: 31.3 g/dL (ref 30.0–36.0)
MCV: 88.1 fL (ref 80.0–100.0)
Monocytes Absolute: 0.6 10*3/uL (ref 0.1–1.0)
Monocytes Relative: 4 %
Neutro Abs: 11.6 10*3/uL — ABNORMAL HIGH (ref 1.7–7.7)
Neutrophils Relative %: 92 %
Platelets: 369 10*3/uL (ref 150–400)
RBC: 5.11 MIL/uL (ref 4.22–5.81)
RDW: 14 % (ref 11.5–15.5)
WBC: 12.7 10*3/uL — ABNORMAL HIGH (ref 4.0–10.5)
nRBC: 0 % (ref 0.0–0.2)

## 2020-01-29 LAB — BASIC METABOLIC PANEL
Anion gap: 23 — ABNORMAL HIGH (ref 5–15)
Anion gap: 13 (ref 5–15)
BUN: 77 mg/dL — ABNORMAL HIGH (ref 8–23)
BUN: 66 mg/dL — ABNORMAL HIGH (ref 8–23)
CO2: 17 mmol/L — ABNORMAL LOW (ref 22–32)
CO2: 20 mmol/L — ABNORMAL LOW (ref 22–32)
Calcium: 8.7 mg/dL — ABNORMAL LOW (ref 8.9–10.3)
Calcium: 7.9 mg/dL — ABNORMAL LOW (ref 8.9–10.3)
Chloride: 90 mmol/L — ABNORMAL LOW (ref 98–111)
Chloride: 107 mmol/L (ref 98–111)
Creatinine, Ser: 4.04 mg/dL — ABNORMAL HIGH (ref 0.61–1.24)
Creatinine, Ser: 3.17 mg/dL — ABNORMAL HIGH (ref 0.61–1.24)
GFR calc Af Amer: 15 mL/min — ABNORMAL LOW (ref >60)
GFR calc Af Amer: 20 mL/min — ABNORMAL LOW (ref >60)
GFR calc non Af Amer: 13 mL/min — ABNORMAL LOW (ref >60)
GFR calc non Af Amer: 18 mL/min — ABNORMAL LOW (ref >60)
Glucose, Bld: 963 mg/dL (ref 70–99)
Glucose, Bld: 438 mg/dL — ABNORMAL HIGH (ref 70–99)
Potassium: 7 mmol/L (ref 3.5–5.1)
Potassium: 4.7 mmol/L (ref 3.5–5.1)
Sodium: 130 mmol/L — ABNORMAL LOW (ref 135–145)
Sodium: 140 mmol/L (ref 135–145)

## 2020-01-29 LAB — I-STAT CREATININE, ED: Creatinine, Ser: 3.8 mg/dL — ABNORMAL HIGH (ref 0.61–1.24)

## 2020-01-29 LAB — URINALYSIS, ROUTINE W REFLEX MICROSCOPIC
Bilirubin Urine: NEGATIVE
Glucose, UA: >=500 mg/dL — AB
Ketones, ur: 5 mg/dL — AB
Nitrite: NEGATIVE
Protein, ur: 30 mg/dL — AB
RBC / HPF: >50 RBC/hpf — ABNORMAL HIGH (ref 0–5)
Specific Gravity, Urine: 1.023 (ref 1.005–1.030)
WBC, UA: >50 WBC/hpf — ABNORMAL HIGH (ref 0–5)
pH: 5 (ref 5.0–8.0)

## 2020-01-29 LAB — CBG MONITORING, ED
Glucose-Capillary: >600 mg/dL (ref 70–99)
Glucose-Capillary: >600 mg/dL (ref 70–99)
Glucose-Capillary: >600 mg/dL (ref 70–99)
Glucose-Capillary: 490 mg/dL — ABNORMAL HIGH (ref 70–99)
Glucose-Capillary: 426 mg/dL — ABNORMAL HIGH (ref 70–99)

## 2020-01-29 LAB — MAGNESIUM: Magnesium: 2.5 mg/dL — ABNORMAL HIGH (ref 1.7–2.4)

## 2020-01-29 LAB — AMMONIA: Ammonia: 20 umol/L (ref 9–35)

## 2020-01-29 LAB — TSH: TSH: 0.914 u[IU]/mL (ref 0.350–4.500)

## 2020-01-29 LAB — PROTIME-INR
INR: 1.2 (ref 0.8–1.2)
Prothrombin Time: 14.8 seconds (ref 11.4–15.2)

## 2020-01-29 LAB — T4, FREE: Free T4: 1.4 ng/dL — ABNORMAL HIGH (ref 0.61–1.12)

## 2020-01-29 LAB — LACTIC ACID, PLASMA
Lactic Acid, Venous: 5.8 mmol/L (ref 0.5–1.9)
Lactic Acid, Venous: 4.8 mmol/L (ref 0.5–1.9)

## 2020-01-29 LAB — CK: Total CK: 1462 U/L — ABNORMAL HIGH (ref 49–397)

## 2020-01-29 LAB — ETHANOL: Alcohol, Ethyl (B): <10 mg/dL (ref <10)

## 2020-01-29 LAB — OSMOLALITY: Osmolality: 341 mOsm/kg (ref 275–295)

## 2020-01-29 LAB — APTT: aPTT: 31 seconds (ref 24–36)

## 2020-01-29 MED ORDER — SODIUM CHLORIDE 0.9 % IV SOLN
200.0000 mg | Freq: Once | INTRAVENOUS | Status: AC
  Administered 2020-01-30: 200 mg via INTRAVENOUS
  Filled 2020-01-29: qty 200

## 2020-01-29 MED ORDER — SIMVASTATIN 20 MG PO TABS
20.0000 mg | ORAL_TABLET | Freq: Every day | ORAL | Status: DC
  Filled 2020-01-29: qty 1

## 2020-01-29 MED ORDER — VANCOMYCIN VARIABLE DOSE PER UNSTABLE RENAL FUNCTION (PHARMACIST DOSING): Status: DC

## 2020-01-29 MED ORDER — SODIUM CHLORIDE 0.9 % IV SOLN: INTRAVENOUS | Status: DC

## 2020-01-29 MED ORDER — LACTATED RINGERS IV BOLUS (SEPSIS)
1000.0000 mL | Freq: Once | INTRAVENOUS | Status: AC
  Administered 2020-01-29: 1000 mL via INTRAVENOUS

## 2020-01-29 MED ORDER — SODIUM CHLORIDE 0.9 % IV SOLN
100.0000 mg | Freq: Every day | INTRAVENOUS | Status: AC
  Administered 2020-01-30 – 2020-02-02 (×4): 100 mg via INTRAVENOUS
  Filled 2020-01-29 (×5): qty 20

## 2020-01-29 MED ORDER — VANCOMYCIN HCL IN DEXTROSE 1-5 GM/200ML-% IV SOLN
1000.0000 mg | Freq: Once | INTRAVENOUS | Status: AC
  Administered 2020-01-29: 1000 mg via INTRAVENOUS
  Filled 2020-01-29: qty 200

## 2020-01-29 MED ORDER — METRONIDAZOLE IN NACL 5-0.79 MG/ML-% IV SOLN
500.0000 mg | Freq: Once | INTRAVENOUS | Status: AC
  Administered 2020-01-29: 500 mg via INTRAVENOUS
  Filled 2020-01-29: qty 100

## 2020-01-29 MED ORDER — INSULIN REGULAR(HUMAN) IN NACL 100-0.9 UT/100ML-% IV SOLN
INTRAVENOUS | Status: DC
  Administered 2020-01-29: 10.5 [IU]/h via INTRAVENOUS
  Filled 2020-01-29 (×2): qty 100

## 2020-01-29 MED ORDER — DEXAMETHASONE SODIUM PHOSPHATE 10 MG/ML IJ SOLN
6.0000 mg | Freq: Once | INTRAMUSCULAR | Status: AC
  Administered 2020-01-30: 6 mg via INTRAVENOUS
  Filled 2020-01-29: qty 1

## 2020-01-29 MED ORDER — DEXAMETHASONE SODIUM PHOSPHATE 10 MG/ML IJ SOLN
6.0000 mg | INTRAMUSCULAR | Status: DC
  Administered 2020-01-30 – 2020-02-05 (×7): 6 mg via INTRAVENOUS
  Filled 2020-01-29 (×7): qty 1

## 2020-01-29 MED ORDER — ACETAMINOPHEN 650 MG RE SUPP
650.0000 mg | Freq: Once | RECTAL | Status: AC
  Administered 2020-01-29: 650 mg via RECTAL
  Filled 2020-01-29: qty 1

## 2020-01-29 MED ORDER — ASPIRIN EC 81 MG PO TBEC
81.0000 mg | DELAYED_RELEASE_TABLET | Freq: Every day | ORAL | Status: DC
  Administered 2020-01-30 – 2020-02-05 (×6): 81 mg via ORAL
  Filled 2020-01-29 (×6): qty 1

## 2020-01-29 MED ORDER — HEPARIN SODIUM (PORCINE) 5000 UNIT/ML IJ SOLN
5000.0000 [IU] | Freq: Three times a day (TID) | INTRAMUSCULAR | Status: DC
  Administered 2020-01-29 – 2020-01-30 (×3): 5000 [IU] via SUBCUTANEOUS
  Filled 2020-01-29 (×3): qty 1

## 2020-01-29 MED ORDER — SODIUM CHLORIDE 0.9 % IV SOLN
2.0000 g | Freq: Once | INTRAVENOUS | Status: AC
  Administered 2020-01-29: 2 g via INTRAVENOUS
  Filled 2020-01-29: qty 2

## 2020-01-29 MED ORDER — CALCIUM GLUCONATE 10 % IV SOLN
1.0000 g | Freq: Once | INTRAVENOUS | Status: AC
  Administered 2020-01-29: 1 g via INTRAVENOUS
  Filled 2020-01-29: qty 10

## 2020-01-29 MED ORDER — CHLORHEXIDINE GLUCONATE CLOTH 2 % EX PADS
6.0000 | MEDICATED_PAD | Freq: Every day | CUTANEOUS | Status: DC
  Administered 2020-01-30 – 2020-01-31 (×2): 6 via TOPICAL

## 2020-01-29 MED ORDER — LACTATED RINGERS IV BOLUS (SEPSIS)
500.0000 mL | Freq: Once | INTRAVENOUS | Status: AC
  Administered 2020-01-29: 500 mL via INTRAVENOUS

## 2020-01-29 MED ORDER — DEXTROSE 50 % IV SOLN: 0.0000 mL | INTRAVENOUS | Status: DC | PRN

## 2020-01-29 MED ORDER — DEXTROSE-NACL 5-0.45 % IV SOLN: INTRAVENOUS | Status: DC

## 2020-01-29 MED ORDER — SODIUM CHLORIDE 0.9 % IV SOLN
2.0000 g | INTRAVENOUS | Status: DC
  Administered 2020-01-30 – 2020-02-01 (×3): 2 g via INTRAVENOUS
  Filled 2020-01-29 (×3): qty 2

## 2020-01-29 NOTE — ED Notes (Signed)
[phone] Pts niece, her dad is his caregiver currently at [facility]. Update Bob Vazquez

## 2020-01-29 NOTE — ED Triage Notes (Signed)
Pt was not heard from or seen in a few days so [facility] went to his residence for a check up. He was found laying in his bath tub, naked, no reported injuries/blood and the tub was dry. Pt was alert & oriented to self while in route to ED, EMS reports his CBG is 450 & that he has been out of insulin for 1 month & has been drinking orange juice. EMS also reports peaked T-waves showing on their monitor.

## 2020-01-29 NOTE — ED Notes (Signed)
Pt to ct 

## 2020-01-29 NOTE — Progress Notes (Signed)
Pharmacy Antibiotic Note  Bob Vazquez is a 79 y.o. male admitted on 01/30/2020 with AMS and sepsis.  Pharmacy has been consulted for vancomycin and cefepime dosing.  WBC elevated at 12.7, lactate elevated at 5.8. Patient's temperature was low at 93.8 F. Scr is elevated at 3.8 this evening. Baseline Scr undetermined, but last one in chart was 1.5 in 2015. Given impaired renal function, will dose vancomycin intermittently as indicated.   Plan: - Vancomycin loading dose 1g IV x1 given in the ED - Variable vancomycin dosing, order further doses based on renal function - Cefepime 2g IV q24hr - Flagyl per MD - Monitor c/s, vancomycin levels at steady state as indicated, renal function, and clinical improvement  Height: 5\' 10"  (177.8 cm) Weight: 180 lb (81.6 kg) IBW/kg (Calculated) : 73  Temp (24hrs), Avg:93.8 F (34.3 C), Min:93.8 F (34.3 C), Max:93.8 F (34.3 C)  Recent Labs  Lab 02/06/2020 1800 01/31/2020 1900  WBC 12.7*  --   CREATININE 4.04* 3.80*  LATICACIDVEN 5.8*  --     Estimated Creatinine Clearance: 16 mL/min (A) (by C-G formula based on SCr of 3.8 mg/dL (H)).    Allergies  Allergen Reactions  . Penicillins Swelling    Childhood reaction; "swelled my arm up"    Antimicrobials this admission: Vancomycin 2/5 >>  Cefepime 2/5 >>  Flagyl 2/5 x1  Dose adjustments this admission:   Microbiology results: 2/5 BCx: sent 2/5 UCx: sent   Thank you for allowing pharmacy to be a part of this patient's care.  Agnes Lawrence, PharmD PGY1 Pharmacy Resident

## 2020-01-29 NOTE — ED Notes (Signed)
Report attempted 

## 2020-01-29 NOTE — ED Provider Notes (Signed)
[facility] EMERGENCY DEPARTMENT Provider Note   CSN: [id number] Arrival date & time: 01/26/2020  1633     History Chief Complaint  Patient presents with   Altered Mental Status   Hyperglycemia   Level 5 caveat due to altered mental status  Bob Vazquez is a 79 y.o. male with history of type 2 diabetes mellitus, bronchitis, hypertension, hyperlipidemia, normal pressure hydrocephalus presents for evaluation of acute onset, persistent altered mental status.  Per EMS, the patient lives independently in his own home.  He had not been seen or heard from from family for 3 days.  A family member went to check up on the patient but was unable to enter his home so called for [employer].  Patient was found laying in an empty bathtub naked.  There was no sign of trauma or head injury, no blood noted on scene.  Per family the patient has been out of his insulin for 1 month.  The patient is alert and oriented to self only on my assessment.  Appears mildly tremulous but moves extremities spontaneously without difficulty.  CBG 450 with EMS and he was given normal saline bolus while en route to the hospital.  The history is provided by the EMS personnel. The history is limited by the condition of the patient.       Past Medical History:  Diagnosis Date   Chronic bronchitis ([place])    Hyperlipemia    Hypertension    Migraines    "q now and then"   NPH (normal pressure hydrocephalus) ([place])    Prostatic abscess 04/12/14   transurethral resection of prostatic abscess   Type II diabetes mellitus [facility])     Patient Active Problem List   Diagnosis Date Noted   Sepsis ([place]) 01/26/2020   Prostatic abscess 04/12/2014   Altered mental status 07/31/2012   DM (diabetes mellitus) ([place]) 07/31/2012   Acute renal failure ([place]) 07/31/2012   Weakness of right leg 07/31/2012   Hyponatremia 07/31/2012   Hypertension    Hyperlipemia    NPH (normal pressure hydrocephalus) ([place])      Past Surgical History:  Procedure Laterality Date   BRAIN SURGERY  ~ 2007   "aneurysm"   TONSILLECTOMY  ~ [place] N/A 04/12/2014   Procedure: TRANSURETHRAL RESECTION OF THE PROSTATE (TURP) Abscess;  Surgeon: Irine Seal, MD;  Location: [facility];  Service: Urology;  Laterality: N/A;  gyrus instruments       History reviewed. No pertinent family history.  Social History   Tobacco Use   Smoking status: Never Smoker   Smokeless tobacco: Never Used  Substance Use Topics   Alcohol use: Yes    Comment: 2 drinks a day   Drug use: No    Home Medications Prior to Admission medications   Medication Sig Start Date End Date Taking? Authorizing Provider  amLODipine (NORVASC) 5 MG tablet Take 5 mg by mouth daily.    [provider]  aspirin EC 81 MG tablet Take 81 mg by mouth at bedtime.    [provider]  Cetirizine HCl 10 MG CAPS Take 10 mg by mouth daily.     [provider]  fluticasone (FLONASE) 50 MCG/ACT nasal spray Place 2 sprays into both nostrils daily.    [provider]  glimepiride (AMARYL) 1 MG tablet Take 1 mg by mouth daily before breakfast.    [provider]  HYDROcodone-acetaminophen (NORCO/VICODIN) 5-325 MG per tablet Take 2  tablets by mouth every 4 (four) hours as needed for moderate pain.    [provider]  insulin glargine (LANTUS) 100 UNIT/ML injection Inject 60 Units into the skin daily with breakfast.     [provider]  simvastatin (ZOCOR) 20 MG tablet Take 20 mg by mouth daily.    [provider]    Allergies    Penicillins  Review of Systems   Review of Systems  Unable to perform ROS: Mental status change    Physical Exam Updated Vital Signs BP (!) 110/58    Pulse (!) 124    Temp (!) 101 F (38.3 C) (Rectal)    Resp (!) 28    Ht 5\' 10"  (1.778 m)    Wt 81.6 kg    SpO2 93%    BMI 25.83 kg/m   Physical Exam Vitals and nursing note  reviewed.  Constitutional:      General: He is not in acute distress.    Appearance: He is well-developed.  HENT:     Head: Normocephalic and atraumatic.     Comments: No Battle's signs, no raccoon's eyes, no rhinorrhea. No hemotympanum. No tenderness to palpation of the face or skull. No deformity, crepitus, or swelling noted.     Mouth/Throat:     Mouth: Mucous membranes are dry.  Eyes:     General:        Right eye: No discharge.        Left eye: No discharge.     Conjunctiva/sclera: Conjunctivae normal.  Neck:     Vascular: No JVD.     Trachea: No tracheal deviation.  Cardiovascular:     Rate and Rhythm: Regular rhythm. Tachycardia present.     Comments: 1+ radial and DP/PT pulses bilaterally.  No lower extremity edema. Pulmonary:     Effort: Pulmonary effort is normal.     Comments: Globally diminished breath sounds. Abdominal:     General: Abdomen is flat. Bowel sounds are normal. There is no distension.     Palpations: Abdomen is soft.     Tenderness: There is no abdominal tenderness. There is no guarding or rebound.  Genitourinary:    Comments: Examination performed in the presence of a chaperone.  Macerated rash noted to the right groin with some yeasty drainage noted Musculoskeletal:     Cervical back: Neck supple.  Skin:    General: Skin is warm and dry.     Findings: No erythema.  Neurological:     Mental Status: He is alert.     GCS: GCS eye subscore is 3. GCS verbal subscore is 3. GCS motor subscore is 6.     Comments: Patient oriented to person only.  He follows most commands.  He attempts to answer questions but does not make any sense.  No pronator drift.  Cranial nerves appear grossly intact.  Sensation intact to light touch of upper and lower extremities bilaterally.  Good muscle tone.  He is moving all extremities spontaneously without difficulty.  Psychiatric:        Behavior: Behavior is withdrawn.     ED Results / Procedures / Treatments    Labs (all labs ordered are listed, but only abnormal results are displayed) Labs Reviewed  RESPIRATORY PANEL BY RT PCR (FLU A&B, COVID) - Abnormal; Notable for the following components:      Result Value   SARS Coronavirus 2 by RT PCR POSITIVE (*)    All other components within normal limits  BASIC  METABOLIC PANEL - Abnormal; Notable for the following components:   Sodium 130 (*)    Potassium 7.0 (*)    Chloride 90 (*)    CO2 17 (*)    Glucose, Bld 963 (*)    BUN 77 (*)    Creatinine, Ser 4.04 (*)    Calcium 8.7 (*)    GFR calc non Af Amer 13 (*)    GFR calc Af Amer 15 (*)    Anion gap 23 (*)    All other components within normal limits  CBC WITH DIFFERENTIAL/PLATELET - Abnormal; Notable for the following components:   WBC 12.7 (*)    Neutro Abs 11.6 (*)    Lymphs Abs 0.4 (*)    Abs Immature Granulocytes 0.12 (*)    All other components within normal limits  URINALYSIS, ROUTINE W REFLEX MICROSCOPIC - Abnormal; Notable for the following components:   APPearance CLOUDY (*)    Glucose, UA >=500 (*)    Hgb urine dipstick LARGE (*)    Ketones, ur 5 (*)    Protein, ur 30 (*)    Leukocytes,Ua LARGE (*)    RBC / HPF >50 (*)    WBC, UA >50 (*)    Bacteria, UA MANY (*)    All other components within normal limits  CK - Abnormal; Notable for the following components:   Total CK 1,462 (*)    All other components within normal limits  LACTIC ACID, PLASMA - Abnormal; Notable for the following components:   Lactic Acid, Venous 5.8 (*)    All other components within normal limits  LACTIC ACID, PLASMA - Abnormal; Notable for the following components:   Lactic Acid, Venous 4.8 (*)    All other components within normal limits  T4, FREE - Abnormal; Notable for the following components:   Free T4 1.40 (*)    All other components within normal limits  OSMOLALITY - Abnormal; Notable for the following components:   Osmolality 341 (*)    All other components within normal limits  CBC -  Abnormal; Notable for the following components:   RBC 4.10 (*)    Hemoglobin 11.4 (*)    HCT 34.1 (*)    All other components within normal limits  I-STAT CREATININE, ED - Abnormal; Notable for the following components:   Creatinine, Ser 3.80 (*)    All other components within normal limits  CBG MONITORING, ED - Abnormal; Notable for the following components:   Glucose-Capillary >600 (*)    All other components within normal limits  POCT I-STAT EG7 - Abnormal; Notable for the following components:   pH, Ven 7.246 (*)    pO2, Ven 76.0 (*)    Acid-base deficit 7.0 (*)    Sodium 128 (*)    Potassium 6.8 (*)    Calcium, Ion 1.01 (*)    All other components within normal limits  CBG MONITORING, ED - Abnormal; Notable for the following components:   Glucose-Capillary >600 (*)    All other components within normal limits  CBG MONITORING, ED - Abnormal; Notable for the following components:   Glucose-Capillary >600 (*)    All other components within normal limits  CBG MONITORING, ED - Abnormal; Notable for the following components:   Glucose-Capillary 490 (*)    All other components within normal limits  CBG MONITORING, ED - Abnormal; Notable for the following components:   Glucose-Capillary 426 (*)    All other components within normal limits  CULTURE, BLOOD (ROUTINE  X 2)  CULTURE, BLOOD (ROUTINE X 2)  URINE CULTURE  ETHANOL  AMMONIA  TSH  APTT  PROTIME-INR  T3  BASIC METABOLIC PANEL  BASIC METABOLIC PANEL  BASIC METABOLIC PANEL  BASIC METABOLIC PANEL  MAGNESIUM  PHOSPHORUS  D-DIMER, QUANTITATIVE (NOT AT [facility])  LACTATE DEHYDROGENASE  FERRITIN  SEDIMENTATION RATE    EKG EKG Interpretation  Date/Time:  Friday January 29 2020 19:30:29 EST Ventricular Rate:  135 PR Interval:    QRS Duration: 96 QT Interval:  303 QTC Calculation: 455 R Axis:   69 Text Interpretation: Sinus tachycardia Inferior infarct, old peaked T waves indicative of hyperkalemia Confirmed by  Sherwood Gambler [phone]) on 01/28/2020 7:42:40 PM   Radiology CT Head Wo Contrast  Result Date: 02/04/2020 CLINICAL DATA:  Altered mental status EXAM: CT HEAD WITHOUT CONTRAST TECHNIQUE: Contiguous axial images were obtained from the base of the skull through the vertex without intravenous contrast. COMPARISON:  12/23/2008 FINDINGS: Brain: No evidence of acute infarction, hemorrhage, hydrocephalus, extra-axial collection or mass lesion/mass effect. There is a right frontal approach VP shunt with tip terminating in the right lateral ventricle. There is encephalomalacia along the course of the VP shunt which has slightly worsened since the prior study. There is atrophy and chronic microvascular ischemic changes. There is a persistent 1 cm density at the left frontal horn periventricular white matter, stable from prior study. Vascular: No hyperdense vessel or unexpected calcification. Skull: Normal. Negative for fracture or focal lesion. Sinuses/Orbits: There is mucosal thickening of the maxillary sinuses and ethmoid air cells bilaterally. The remaining paranasal sinuses and mastoid air cells are essentially clear. Other: None. IMPRESSION: 1. No acute intracranial abnormality. 2. Chronic microvascular ischemic changes and atrophy which has progressed since prior study. 3. Well-positioned VP shunt. Electronically Signed   By: Constance Holster M.D.   On: 02/09/2020 19:32   DG Chest Portable 1 View  Result Date: 01/28/2020 CLINICAL DATA:  Altered mental status EXAM: PORTABLE CHEST 1 VIEW COMPARISON:  09/08/2014 FINDINGS: VP shunt traversing the right chest, continuous where seen. Prominent markings at the bases. No effusion, edema, or pneumothorax. Normal heart size and mediastinal contours. IMPRESSION: Indistinct opacity at the lung bases could be atelectasis (lung volumes are low) or infectious infiltrates. Electronically Signed   By: Monte Fantasia M.D.   On: 01/27/2020 17:47    Procedures .Critical  Care Performed by: Renita Papa, PA-C Authorized by: Renita Papa, PA-C   Critical care provider statement:    Critical care time (minutes):  43   Critical care was necessary to treat or prevent imminent or life-threatening deterioration of the following conditions:  Sepsis, respiratory failure, renal failure and metabolic crisis   Critical care was time spent personally by me on the following activities:  Discussions with consultants, evaluation of patient's response to treatment, examination of patient, ordering and performing treatments and interventions, ordering and review of laboratory studies, ordering and review of radiographic studies, pulse oximetry, re-evaluation of patient's condition, obtaining history from patient or surrogate and review of old charts   (including critical care time)  Medications Ordered in ED Medications  insulin regular, human (MYXREDLIN) 100 units/ 100 mL infusion (6.5 Units/hr Intravenous Rate/Dose Change 02/08/2020 2255)  0.9 %  sodium chloride infusion ( Intravenous New Bag/Given 02/02/2020 1930)  dextrose 5 %-0.45 % sodium chloride infusion (has no administration in time range)  dextrose 50 % solution 0-50 mL (has no administration in time range)  ceFEPIme (MAXIPIME) 2 g in sodium chloride 0.9 %  100 mL IVPB (has no administration in time range)  vancomycin variable dose per unstable renal function (pharmacist dosing) (has no administration in time range)  aspirin EC tablet 81 mg (81 mg Oral Not Given 02/16/2020 2159)  simvastatin (ZOCOR) tablet 20 mg (20 mg Oral Not Given 02/06/2020 2200)  heparin injection 5,000 Units (5,000 Units Subcutaneous Given 02/18/2020 2259)  dexamethasone (DECADRON) injection 6 mg (has no administration in time range)  dexamethasone (DECADRON) injection 6 mg (has no administration in time range)  remdesivir 200 mg in sodium chloride 0.9% 250 mL IVPB (has no administration in time range)    Followed by  remdesivir 100 mg in sodium chloride 0.9 %  100 mL IVPB (has no administration in time range)  ceFEPIme (MAXIPIME) 2 g in sodium chloride 0.9 % 100 mL IVPB (0 g Intravenous Stopped 02/10/2020 1935)  metroNIDAZOLE (FLAGYL) IVPB 500 mg (0 mg Intravenous Stopped 02/08/2020 2000)  vancomycin (VANCOCIN) IVPB 1000 mg/200 mL premix (0 mg Intravenous Stopped 02/07/2020 2005)  lactated ringers bolus 1,000 mL (0 mLs Intravenous Stopped 02/16/2020 2108)    And  lactated ringers bolus 1,000 mL (0 mLs Intravenous Stopped 02/05/2020 2200)    And  lactated ringers bolus 500 mL (0 mLs Intravenous Stopped 02/08/2020 2055)  calcium gluconate inj 10% (1 g) URGENT USE ONLY! (1 g Intravenous Given 01/28/2020 1945)  acetaminophen (TYLENOL) suppository 650 mg (650 mg Rectal Given 02/11/2020 2116)    ED Course  I have reviewed the triage vital signs and the nursing notes.  Pertinent labs & imaging results that were available during my care of the patient were reviewed by me and considered in my medical decision making (see chart for details).   MDM Rules/Calculators/A&P                      Bob Vazquez was evaluated in Emergency Department on 02/08/2020 for the symptoms described in the history of present illness. He was evaluated in the context of the global COVID-19 pandemic, which necessitated consideration that the patient might be at risk for infection with the SARS-CoV-2 virus that causes COVID-19. Institutional protocols and algorithms that pertain to the evaluation of patients at risk for COVID-19 are in a state of rapid change based on information released by regulatory bodies including the CDC and federal and state organizations. These policies and algorithms were followed during the patient's care in the ED.  Patient presents brought in by EMS for evaluation of altered mental status.  Had not been seen by family in approximately 3 days.  He lives independently and is typically able to complete his ADLs without difficulty.  He was found naked in an empty bathtub by [employer] and EMS.   Found to be hypoglycemic with EMS.  Initially hypothermic, persistently tachycardic and tachypneic in the ED.  He was placed on [facility].  Code sepsis was initiated and he was given a 30 cc/kg bolus of LR and started on broad-spectrum antibiotics for sepsis due to unknown source.   While in the ED the patient was found to be markedly hyperglycemic and apparently in DKA with low bicarb, elevated anion gap and VBG noting blood pH of 7.2.  He also appears to be in renal failure with a creatinine of 4.04, BUN 77, and hyperkalemia with potassium of 7.0.  His EKG shows peaked T waves.  He was started on an insulin drip and given calcium gluconate for his hyperglycemia and hyperkalemia. He had an elevated lactic  which improved after fluid boluses. CBG steadily improving in the ED on re-evaluation.  Patient found to be Covid positive as well.  His chest x-ray is equivocal for pneumonia.  While in the ED his O2 saturations dropped to around 90% on room air so he was placed on 2 L supplemental oxygen via nasal cannula.  His UA suggests UTI, will culture.  Suspect this is likely the source of his sepsis.  His head CT shows well-positioned VP shunt, no acute intracranial abnormality.   On recheck of vital signs he is found to have a rectal temp of 101 F.  Discontinued Retail banker.  He remains persistently tachycardic.  Will order Tylenol suppositories.  8:25PM Spoke with Dr. Mindi Junker with [facility] hospitalist service who is concerned that the patient may require ICU level care and recommends consulting intensivist.  8:33 PM Spoke with patient's niece Mardene Celeste on the phone.  She reports that her father is the patient's brother and is typically the patient's caregiver however he is currently hospitalized with Covid.  Discussed the patient's work-up and significant illness.  Discussed CODE STATUS and the patient's niece advises that she would like everything done for patient though she and patient have not had an  explicit discussion regarding his preferences.   8:53PM CONSULT: Spoke with Dr. Genevive Bi with PCCM.  She agrees that the patient requires ICU level care.  Intensivist service will assess the patient emergently in the ED and plan for admission.   Final Clinical Impression(s) / ED Diagnoses Final diagnoses:  Sepsis with acute renal failure without septic shock, due to unspecified organism, unspecified acute renal failure type ([place])  Diabetic ketoacidosis without coma associated with type 2 diabetes mellitus ([place])  COVID toes  Acute respiratory failure with hypoxia [facility])    Rx / DC Orders ED Discharge Orders    None       Renita Papa, PA-C 02/03/2020 2353    Sherwood Gambler, MD 01/30/20 1228

## 2020-01-29 NOTE — ED Notes (Signed)
Rectal temp 101. D/c bear hugger and will continue to [place]

## 2020-01-29 NOTE — ED Notes (Signed)
Bear hugger was put on pt by the tech per EDP request.
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
## 2020-01-29 NOTE — Sepsis Progress Note (Signed)
Notified bedside nurse of need to administer antibiotics.  

## 2020-01-30 ENCOUNTER — Other Ambulatory Visit (HOSPITAL_COMMUNITY): Payer: Medicare Other

## 2020-01-30 ENCOUNTER — Encounter (HOSPITAL_COMMUNITY): Payer: Self-pay | Admitting: Specialist

## 2020-01-30 ENCOUNTER — Inpatient Hospital Stay (HOSPITAL_COMMUNITY): Payer: Medicare Other

## 2020-01-30 DIAGNOSIS — E1110 Type 2 diabetes mellitus with ketoacidosis without coma: Secondary | ICD-10-CM

## 2020-01-30 DIAGNOSIS — N1832 Chronic kidney disease, stage 3b: Secondary | ICD-10-CM

## 2020-01-30 DIAGNOSIS — J1282 Pneumonia due to coronavirus disease 2019: Secondary | ICD-10-CM

## 2020-01-30 DIAGNOSIS — J9601 Acute respiratory failure with hypoxia: Secondary | ICD-10-CM

## 2020-01-30 DIAGNOSIS — E875 Hyperkalemia: Secondary | ICD-10-CM

## 2020-01-30 DIAGNOSIS — E872 Acidosis: Secondary | ICD-10-CM

## 2020-01-30 DIAGNOSIS — N3000 Acute cystitis without hematuria: Secondary | ICD-10-CM

## 2020-01-30 DIAGNOSIS — N179 Acute kidney failure, unspecified: Secondary | ICD-10-CM

## 2020-01-30 LAB — CBC
HCT: 34.1 % — ABNORMAL LOW (ref 39.0–52.0)
Hemoglobin: 11.4 g/dL — ABNORMAL LOW (ref 13.0–17.0)
MCH: 27.8 pg (ref 26.0–34.0)
MCHC: 33.4 g/dL (ref 30.0–36.0)
MCV: 83.2 fL (ref 80.0–100.0)
Platelets: 271 10*3/uL (ref 150–400)
RBC: 4.1 MIL/uL — ABNORMAL LOW (ref 4.22–5.81)
RDW: 13.7 % (ref 11.5–15.5)
WBC: 10.4 10*3/uL (ref 4.0–10.5)
nRBC: 0 % (ref 0.0–0.2)

## 2020-01-30 LAB — GLUCOSE, CAPILLARY
Glucose-Capillary: 157 mg/dL — ABNORMAL HIGH (ref 70–99)
Glucose-Capillary: 168 mg/dL — ABNORMAL HIGH (ref 70–99)
Glucose-Capillary: 170 mg/dL — ABNORMAL HIGH (ref 70–99)
Glucose-Capillary: 172 mg/dL — ABNORMAL HIGH (ref 70–99)
Glucose-Capillary: 180 mg/dL — ABNORMAL HIGH (ref 70–99)
Glucose-Capillary: 180 mg/dL — ABNORMAL HIGH (ref 70–99)
Glucose-Capillary: 182 mg/dL — ABNORMAL HIGH (ref 70–99)
Glucose-Capillary: 187 mg/dL — ABNORMAL HIGH (ref 70–99)
Glucose-Capillary: 194 mg/dL — ABNORMAL HIGH (ref 70–99)
Glucose-Capillary: 197 mg/dL — ABNORMAL HIGH (ref 70–99)
Glucose-Capillary: 210 mg/dL — ABNORMAL HIGH (ref 70–99)
Glucose-Capillary: 215 mg/dL — ABNORMAL HIGH (ref 70–99)
Glucose-Capillary: 238 mg/dL — ABNORMAL HIGH (ref 70–99)
Glucose-Capillary: 244 mg/dL — ABNORMAL HIGH (ref 70–99)
Glucose-Capillary: 271 mg/dL — ABNORMAL HIGH (ref 70–99)
Glucose-Capillary: 294 mg/dL — ABNORMAL HIGH (ref 70–99)
Glucose-Capillary: 346 mg/dL — ABNORMAL HIGH (ref 70–99)

## 2020-01-30 LAB — BASIC METABOLIC PANEL
Anion gap: 13 (ref 5–15)
Anion gap: 11 (ref 5–15)
Anion gap: 13 (ref 5–15)
BUN: 73 mg/dL — ABNORMAL HIGH (ref 8–23)
BUN: 69 mg/dL — ABNORMAL HIGH (ref 8–23)
BUN: 63 mg/dL — ABNORMAL HIGH (ref 8–23)
CO2: 24 mmol/L (ref 22–32)
CO2: 25 mmol/L (ref 22–32)
CO2: 24 mmol/L (ref 22–32)
Calcium: 8.8 mg/dL — ABNORMAL LOW (ref 8.9–10.3)
Calcium: 8.9 mg/dL (ref 8.9–10.3)
Calcium: 8.7 mg/dL — ABNORMAL LOW (ref 8.9–10.3)
Chloride: 103 mmol/L (ref 98–111)
Chloride: 107 mmol/L (ref 98–111)
Chloride: 107 mmol/L (ref 98–111)
Creatinine, Ser: 3.46 mg/dL — ABNORMAL HIGH (ref 0.61–1.24)
Creatinine, Ser: 3.23 mg/dL — ABNORMAL HIGH (ref 0.61–1.24)
Creatinine, Ser: 2.97 mg/dL — ABNORMAL HIGH (ref 0.61–1.24)
GFR calc Af Amer: 18 mL/min — ABNORMAL LOW (ref >60)
GFR calc Af Amer: 20 mL/min — ABNORMAL LOW (ref >60)
GFR calc Af Amer: 22 mL/min — ABNORMAL LOW (ref >60)
GFR calc non Af Amer: 16 mL/min — ABNORMAL LOW (ref >60)
GFR calc non Af Amer: 17 mL/min — ABNORMAL LOW (ref >60)
GFR calc non Af Amer: 19 mL/min — ABNORMAL LOW (ref >60)
Glucose, Bld: 362 mg/dL — ABNORMAL HIGH (ref 70–99)
Glucose, Bld: 204 mg/dL — ABNORMAL HIGH (ref 70–99)
Glucose, Bld: 189 mg/dL — ABNORMAL HIGH (ref 70–99)
Potassium: 5.4 mmol/L — ABNORMAL HIGH (ref 3.5–5.1)
Potassium: 5.8 mmol/L — ABNORMAL HIGH (ref 3.5–5.1)
Potassium: 5.5 mmol/L — ABNORMAL HIGH (ref 3.5–5.1)
Sodium: 140 mmol/L (ref 135–145)
Sodium: 143 mmol/L (ref 135–145)
Sodium: 144 mmol/L (ref 135–145)

## 2020-01-30 LAB — HEPATIC FUNCTION PANEL
ALT: 27 U/L (ref 0–44)
AST: 52 U/L — ABNORMAL HIGH (ref 15–41)
Albumin: 2.3 g/dL — ABNORMAL LOW (ref 3.5–5.0)
Alkaline Phosphatase: 62 U/L (ref 38–126)
Bilirubin, Direct: 0.2 mg/dL (ref 0.0–0.2)
Indirect Bilirubin: 0.3 mg/dL (ref 0.3–0.9)
Total Bilirubin: 0.5 mg/dL (ref 0.3–1.2)
Total Protein: 7.1 g/dL (ref 6.5–8.1)

## 2020-01-30 LAB — T3: T3, Total: 64 ng/dL — ABNORMAL LOW (ref 71–180)

## 2020-01-30 LAB — D-DIMER, QUANTITATIVE: D-Dimer, Quant: 3.64 ug/mL-FEU — ABNORMAL HIGH (ref 0.00–0.50)

## 2020-01-30 LAB — CK: Total CK: 1493 U/L — ABNORMAL HIGH (ref 49–397)

## 2020-01-30 LAB — SEDIMENTATION RATE: Sed Rate: 98 mm/hr — ABNORMAL HIGH (ref 0–16)

## 2020-01-30 LAB — LACTATE DEHYDROGENASE: LDH: 306 U/L — ABNORMAL HIGH (ref 98–192)

## 2020-01-30 LAB — PHOSPHORUS: Phosphorus: 3.3 mg/dL (ref 2.5–4.6)

## 2020-01-30 LAB — MRSA PCR SCREENING: MRSA by PCR: NEGATIVE

## 2020-01-30 LAB — FERRITIN: Ferritin: 2215 ng/mL — ABNORMAL HIGH (ref 24–336)

## 2020-01-30 MED ORDER — INSULIN GLARGINE 100 UNIT/ML ~~LOC~~ SOLN
20.0000 [IU] | Freq: Two times a day (BID) | SUBCUTANEOUS | Status: DC
  Administered 2020-01-30 (×2): 20 [IU] via SUBCUTANEOUS
  Filled 2020-01-30 (×4): qty 0.2

## 2020-01-30 MED ORDER — AMLODIPINE BESYLATE 5 MG PO TABS
5.0000 mg | ORAL_TABLET | Freq: Every day | ORAL | Status: DC
  Administered 2020-02-01 – 2020-02-04 (×4): 5 mg via ORAL
  Filled 2020-01-30 (×4): qty 1

## 2020-01-30 MED ORDER — HEPARIN SODIUM (PORCINE) 5000 UNIT/ML IJ SOLN
7500.0000 [IU] | Freq: Three times a day (TID) | INTRAMUSCULAR | Status: DC
  Administered 2020-01-30 – 2020-02-03 (×12): 7500 [IU] via SUBCUTANEOUS
  Filled 2020-01-30 (×12): qty 2

## 2020-01-30 MED ORDER — INSULIN ASPART 100 UNIT/ML ~~LOC~~ SOLN
0.0000 [IU] | SUBCUTANEOUS | Status: DC
  Administered 2020-01-30: 3 [IU] via SUBCUTANEOUS
  Administered 2020-01-30 – 2020-01-31 (×2): 5 [IU] via SUBCUTANEOUS
  Administered 2020-01-31: 3 [IU] via SUBCUTANEOUS
  Administered 2020-01-31 – 2020-02-01 (×2): 5 [IU] via SUBCUTANEOUS
  Administered 2020-02-01: 3 [IU] via SUBCUTANEOUS

## 2020-01-30 MED ORDER — LACTATED RINGERS IV BOLUS
1000.0000 mL | Freq: Once | INTRAVENOUS | Status: AC
  Administered 2020-01-30: 1000 mL via INTRAVENOUS

## 2020-01-30 MED ORDER — SODIUM ZIRCONIUM CYCLOSILICATE 10 G PO PACK
10.0000 g | PACK | Freq: Once | ORAL | Status: AC
  Administered 2020-01-30: 10 g via ORAL
  Filled 2020-01-30: qty 1

## 2020-01-30 NOTE — Plan of Care (Signed)
  Problem: Education: Goal: Knowledge of General Education information will improve Description: Including pain rating scale, medication(s)/side effects and non-pharmacologic comfort measures Outcome: Progressing   Problem: Health Behavior/Discharge Planning: Goal: Ability to manage health-related needs will improve Outcome: Progressing   Problem: Clinical Measurements: Goal: Ability to maintain clinical measurements within normal limits will improve Outcome: Progressing Goal: Will remain free from infection Outcome: Progressing Goal: Diagnostic test results will improve Outcome: Progressing Goal: Respiratory complications will improve Outcome: Progressing Goal: Cardiovascular complication will be avoided Outcome: Progressing   Problem: Activity: Goal: Risk for activity intolerance will decrease Outcome: Progressing   Problem: Nutrition: Goal: Adequate nutrition will be maintained Outcome: Progressing   Problem: Coping: Goal: Level of anxiety will decrease Outcome: Progressing   Problem: Elimination: Goal: Will not experience complications related to bowel motility Outcome: Progressing Goal: Will not experience complications related to urinary retention Outcome: Progressing   Problem: Pain Managment: Goal: General experience of comfort will improve Outcome: Progressing   Problem: Safety: Goal: Ability to remain free from injury will improve Outcome: Progressing   Problem: Skin Integrity: Goal: Risk for impaired skin integrity will decrease Outcome: Progressing   Problem: Education: Goal: Ability to describe self-care measures that may prevent or decrease complications (Diabetes Survival Skills Education) will improve Outcome: Progressing Goal: Individualized Educational Video(s) Outcome: Progressing   Problem: Coping: Goal: Ability to adjust to condition or change in health will improve Outcome: Progressing   Problem: Fluid Volume: Goal: Ability to  maintain a balanced intake and output will improve Outcome: Progressing   Problem: Health Behavior/Discharge Planning: Goal: Ability to identify and utilize available resources and services will improve Outcome: Progressing Goal: Ability to manage health-related needs will improve Outcome: Progressing   Problem: Metabolic: Goal: Ability to maintain appropriate glucose levels will improve Outcome: Progressing   Problem: Nutritional: Goal: Maintenance of adequate nutrition will improve Outcome: Progressing Goal: Progress toward achieving an optimal weight will improve Outcome: Progressing   Problem: Skin Integrity: Goal: Risk for impaired skin integrity will decrease Outcome: Progressing   Problem: Tissue Perfusion: Goal: Adequacy of tissue perfusion will improve Outcome: Progressing   Problem: Education: Goal: Knowledge of risk factors and measures for prevention of condition will improve Outcome: Progressing   Problem: Coping: Goal: Psychosocial and spiritual needs will be supported Outcome: Progressing   Problem: Respiratory: Goal: Will maintain a patent airway Outcome: Progressing Goal: Complications related to the disease process, condition or treatment will be avoided or minimized Outcome: Progressing   

## 2020-01-30 NOTE — Progress Notes (Signed)
Received pt. To MICU bed 13 at 0000. Pt. Mumbles and is drowsy, but able to state his name when asked, speech very strained. Pt. Unable to answer any questions at present concerning health history or medications. Pt on insulin drip and fluids. Continue to closely monitor

## 2020-01-30 NOTE — Plan of Care (Signed)
  Problem: Elimination: Goal: Will not experience complications related to urinary retention Outcome: Progressing   Problem: Pain Managment: Goal: General experience of comfort will improve Outcome: Progressing   Problem: Activity: Goal: Risk for activity intolerance will decrease Outcome: Not Progressing Note: Pt currently bed rest   Problem: Nutrition: Goal: Adequate nutrition will be maintained Outcome: Not Progressing Note: NPO. Will address with MD this morning

## 2020-01-30 NOTE — Progress Notes (Signed)
NAME:  Bob Vazquez, MRN:  [id number], DOB:  [birth year], LOS: 1 ADMISSION DATE:  02/06/2020, CONSULTATION DATE:  01/30/20 REFERRING MD:  , CHIEF COMPLAINT:  AMS   Brief History   Bob Vazquez is a 79 year old male with a history of type 2 diabetes, hypertension, hyperlipidemia, NPH who lives alone and family had not heard from him in several days.  Law enforcement did a wellness check and found patient in an empty bathtub.  No signs of trauma.  Patient has been awake though confused and was found to have UTI, likely sepsis and DKA and also Covid-19 positive.  PCCM consulted for admission  History of present illness   Bob Vazquez is a 79 year old male with a history of type 2 diabetes, hypertension, hyperlipidemia, NPH who lives alone and family had not heard from him in several days.   Law enforcement found patient naked in an empty bathtub without signs of trauma and awake, though confused.  Per family, he has been on for about 1 month and initial glucose was 450.  The patient is apparently cared for by his nephew who is hospitalized currently with Covid-19.  In the emergency department he is remained oriented to self only, found to have a fever of 101 Fahrenheit, tachycardia and lactic acidosis with UTI and COVID-19 with possible small basilar opacities and DKA.  He was covered with broad-spectrum antibiotics and given 3cc/kg IV fluids along with insulin drip. Potassium 7.0 with peaked t-waves on EKG.  On evaluation the patient is requiring 2 L of nasal cannula oxygen, he is moaning and gives no history.  CT head was negative.  He remains tachycardic but not hypotensive.   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  Significant Hospital Events   2/5 Admit to PCCM  Consults:    Procedures:    Significant  Diagnostic Tests:  2/5 CXR>>Indistinct opacity at the lung bases could be atelectasis (lung volumes are low) or infectious infiltrates. 2/5 CT head>>There is a rightfrontal approach VP shunt with tip terminating in the right lateral ventricle. There is encephalomalacia along the course of the VP shunt which has slightly worsened since the prior study 2/6 CT renal stone protocol (personally reviewed)-bibasilar lung infiltrates, no hydronephrosis or renal/perinephric fat stranding  Micro Data:  2/5 SARS-Covid-2>> positive 2/5 blood cultures x2>> 2/5 urine culture>>   Antimicrobials:  Vancomycin 2/5-2/6 Cefepime 2/5- Flagyl 2/5 only  Interim history/subjective:  Denies new complaints.   Objective   Blood pressure 128/62, pulse 94, temperature 98.5 F (36.9 C), temperature source Axillary, resp. rate 20, height 5\' 10"  (1.778 m), weight 72.6 kg, SpO2 95 %.        Intake/Output Summary (Last 24 hours) at 01/30/2020 1439 Last data filed at 01/30/2020 1335 Gross per 24 hour  Intake 1361.06 ml  Output 525 ml  Net 836.06 ml   Filed Weights   02/11/2020 1734 01/30/20 0000 01/30/20 0500  Weight: 81.6 kg 72.6 kg 72.6 kg    General: Elderly man sleeping comfortably in bed, easily arousable HEENT: /AT, eyes anicteric.  Bump on the right side of his skull, he says is chronic-no bruising or cuts around it Neuro: Sleeping, but opens his eyes, tracks, answers questions appropriately.  Oriented to place. CV: S1-S2, regular rate and rhythm PULM: Mild basilar rhonchi, breathing comfortably on 2 L nasal cannula.  No tachypnea  or conversational dyspnea. GI: Soft, nontender, nondistended Extremities: No peripheral edema, no cyanosis Skin: No rashes or wounds   Resolved Hospital Problem list     Assessment & Plan:   Diabetic ketoacidosis with hyperkalemia -Been out of insulin for over a month, sepsis likely also contributing -pH 7.24 and gap of 23 -Initial K7.0 with peak T waves, received  insulin and calcium gluconate P: -Transition to basal bolus insulin once anion gap closed.  Will decrease Lantus to 20 units twice daily (reported home dose Lantus 60 units daily) + scale insulin.  Will overlap Lantus and insulin infusion by 2 hours. -Continue Accu-Cheks every 4 hours -Repeat BMP this afternoon -Goal BG 140-180 while admitted to the ICU -We will need diabetes education prior to discharge  Severe sepsis, suspect primarily due to urinary tract infection -Covered broadly with Vanc and cefepime P: -CT scan reviewed-no pyelonephritis. -DC vancomycin, continue cefepime -Continue to follow cultures -Echocardiogram pending  Hyperkalemia, persistent despite DKA resolution -Lokelma 10 mg p.o. -Recheck BMP this afternoon  Covid-19 pneumonia -Unclear how long patient has been infected and is requiring minimal oxygen supplementation, however patient is at high risk for severe disease therefore will cover with standard Covid treatment P: -Remdesivir and dexamethasone per protocol -Continue supplemental oxygen as required to maintain SPO2 greater than 88% -Subcu heparin per Covid protocol  Nonoliguric AKI on CKD - possibly ATN from prolonged pre-renal state vs rhabdo -last creatinine 6 yrs ago 1.7,  Today up 4.0 likely secondary to sepsis and volume depletion P: -Serial BMPs -Avoid nephrotoxic meds -Renally dose medications -Measure I/O -Maintain euvolemia  Acute metabolic encephalopathy -suspect metabolic and secondary to the above, per family is alert and oriented at baseline -CT head negative P: -Renally dose medications -Avoid sedating meds -Correct electrolyte abnormalities  Rhabdomyolysis -Recheck CK today -Hold statin  Hypertension/ hyperlipidemia -hold Norvasc and statin in the setting of critical illness P: -Continue to monitor blood pressure.  Restart amlodipine 5 mg daily -Echo pending  Best practice:  Diet: NPO Pain/Anxiety/Delirium protocol (if  indicated): N/A VAP protocol (if indicated): N/A DVT prophylaxis: Heparin GI prophylaxis: N/A Glucose control: Insulin drip Mobility: Bedrest Code Status: Full code Family Communication: updated sister Bob Vazquez Disposition: ICU  Labs   CBC: Recent Labs  Lab 01/27/2020 1800 01/26/2020 1900 02/05/2020 2300  WBC 12.7*  --  10.4  NEUTROABS 11.6*  --   --   HGB 14.1 15.0 11.4*  HCT 45.0 44.0 34.1*  MCV 88.1  --  83.2  PLT 369  --  [id number]    Basic Metabolic Panel: Recent Labs  Lab 01/31/2020 1800 02/19/2020 1800 02/13/2020 1900 02/18/2020 2300 01/30/20 0014 01/30/20 0149 01/30/20 0549 01/30/20 1132  NA 130*   < > 128* 140  --  140 143 144  K 7.0*   < > 6.8* 4.7  --  5.4* 5.8* 5.5*  CL 90*  --   --  107  --  103 107 107  CO2 17*  --   --  20*  --  24 25 24   GLUCOSE 963*  --   --  438*  --  362* 204* 189*  BUN 77*  --   --  66*  --  73* 69* 63*  CREATININE 4.04*   < > 3.80* 3.17*  --  3.46* 3.23* 2.97*  CALCIUM 8.7*  --   --  7.9*  --  8.8* 8.9 8.7*  MG  --   --   --  2.5*  --   --   --   --  PHOS  --   --   --   --  3.3  --   --   --    < > = values in this interval not displayed.   GFR: Estimated Creatinine Clearance: 20.4 mL/min (A) (by C-G formula based on SCr of 2.97 mg/dL (H)). Recent Labs  Lab 02/18/2020 1800 01/25/2020 2150 02/13/2020 2300  WBC 12.7*  --  10.4  LATICACIDVEN 5.8* 4.8*  --     Liver Function Tests: Recent Labs  Lab 01/30/20 1132  AST 52*  ALT 27  ALKPHOS 62  BILITOT 0.5  PROT 7.1  ALBUMIN 2.3*   No results for input(s): LIPASE, AMYLASE in the last 168 hours. Recent Labs  Lab 02/19/2020 1800  AMMONIA 20    ABG    Component Value Date/Time   HCO3 20.6 02/14/2020 1900   TCO2 22 02/13/2020 1900   ACIDBASEDEF 7.0 (H) 01/31/2020 1900   O2SAT 92.0 02/13/2020 1900     Coagulation Profile: Recent Labs  Lab 02/09/2020 1800  INR 1.2    Cardiac Enzymes: Recent Labs  Lab 02/19/2020 1800 01/30/20 1132  CKTOTAL 1,462* 1,493*    HbA1C: Hgb A1c  MFr Bld  Date/Time Value Ref Range Status  07/31/2012 05:20 PM 9.4 (H) <5.7 % Final    Comment:    (NOTE)                                                                       According to the ADA Clinical Practice Recommendations for 2011, when HbA1c is used as a screening test:  >=6.5%   Diagnostic of Diabetes Mellitus           (if abnormal result is confirmed) 5.7-6.4%   Increased risk of developing Diabetes Mellitus References:Diagnosis and Classification of Diabetes Mellitus,Diabetes [id number] 1):S62-S69 and Standards of Medical Care in         Diabetes - 2011,Diabetes Care,2011,34 (Suppl 1):S11-S61.    CBG: Recent Labs  Lab 01/30/20 0911 01/30/20 1000 01/30/20 1139 01/30/20 1257 01/30/20 1400  GLUCAP 157* 170* 194* 180* 168*    This patient is critically ill with multiple organ system failure which requires frequent high complexity decision making, assessment, support, evaluation, and titration of therapies. This was completed through the application of advanced monitoring technologies and extensive interpretation of multiple databases. During this encounter critical care time was devoted to patient care services described in this note for 45 minutes.   Julian Hy, DO 01/30/20 2:43 PM [facility]

## 2020-01-31 ENCOUNTER — Inpatient Hospital Stay (HOSPITAL_COMMUNITY): Payer: Medicare Other

## 2020-01-31 DIAGNOSIS — G9340 Encephalopathy, unspecified: Secondary | ICD-10-CM

## 2020-01-31 DIAGNOSIS — N390 Urinary tract infection, site not specified: Secondary | ICD-10-CM

## 2020-01-31 DIAGNOSIS — R509 Fever, unspecified: Secondary | ICD-10-CM

## 2020-01-31 DIAGNOSIS — B9689 Other specified bacterial agents as the cause of diseases classified elsewhere: Secondary | ICD-10-CM

## 2020-01-31 LAB — GLUCOSE, CAPILLARY
Glucose-Capillary: 107 mg/dL — ABNORMAL HIGH (ref 70–99)
Glucose-Capillary: 113 mg/dL — ABNORMAL HIGH (ref 70–99)
Glucose-Capillary: 128 mg/dL — ABNORMAL HIGH (ref 70–99)
Glucose-Capillary: 160 mg/dL — ABNORMAL HIGH (ref 70–99)
Glucose-Capillary: 212 mg/dL — ABNORMAL HIGH (ref 70–99)
Glucose-Capillary: 87 mg/dL (ref 70–99)

## 2020-01-31 LAB — BASIC METABOLIC PANEL
Anion gap: 12 (ref 5–15)
BUN: 67 mg/dL — ABNORMAL HIGH (ref 8–23)
CO2: 21 mmol/L — ABNORMAL LOW (ref 22–32)
Calcium: 8.7 mg/dL — ABNORMAL LOW (ref 8.9–10.3)
Chloride: 111 mmol/L (ref 98–111)
Creatinine, Ser: 2.59 mg/dL — ABNORMAL HIGH (ref 0.61–1.24)
GFR calc Af Amer: 26 mL/min — ABNORMAL LOW (ref >60)
GFR calc non Af Amer: 22 mL/min — ABNORMAL LOW (ref >60)
Glucose, Bld: 182 mg/dL — ABNORMAL HIGH (ref 70–99)
Potassium: 5.5 mmol/L — ABNORMAL HIGH (ref 3.5–5.1)
Sodium: 144 mmol/L (ref 135–145)

## 2020-01-31 LAB — ECHOCARDIOGRAM LIMITED
Height: 70 in
Weight: 2557.34 oz

## 2020-01-31 LAB — HEPATIC FUNCTION PANEL
ALT: 28 U/L (ref 0–44)
AST: 46 U/L — ABNORMAL HIGH (ref 15–41)
Albumin: 2.2 g/dL — ABNORMAL LOW (ref 3.5–5.0)
Alkaline Phosphatase: 65 U/L (ref 38–126)
Bilirubin, Direct: 0.2 mg/dL (ref 0.0–0.2)
Indirect Bilirubin: 0.6 mg/dL (ref 0.3–0.9)
Total Bilirubin: 0.8 mg/dL (ref 0.3–1.2)
Total Protein: 7.3 g/dL (ref 6.5–8.1)

## 2020-01-31 LAB — CK: Total CK: 604 U/L — ABNORMAL HIGH (ref 49–397)

## 2020-01-31 MED ORDER — LACTATED RINGERS IV BOLUS
1000.0000 mL | Freq: Once | INTRAVENOUS | Status: AC
  Administered 2020-01-31: 10:00:00 1000 mL via INTRAVENOUS

## 2020-01-31 MED ORDER — DEXTROSE 5 % IV SOLN: INTRAVENOUS | Status: DC

## 2020-01-31 MED ORDER — INSULIN GLARGINE 100 UNIT/ML ~~LOC~~ SOLN
30.0000 [IU] | Freq: Two times a day (BID) | SUBCUTANEOUS | Status: DC
  Administered 2020-01-31: 30 [IU] via SUBCUTANEOUS
  Filled 2020-01-31 (×2): qty 0.3

## 2020-01-31 MED ORDER — INSULIN GLARGINE 100 UNIT/ML ~~LOC~~ SOLN
20.0000 [IU] | Freq: Two times a day (BID) | SUBCUTANEOUS | Status: DC
  Filled 2020-01-31 (×3): qty 0.2

## 2020-01-31 MED ORDER — SODIUM ZIRCONIUM CYCLOSILICATE 10 G PO PACK: 10.0000 g | PACK | Freq: Once | ORAL | Status: DC

## 2020-01-31 MED ORDER — SODIUM ZIRCONIUM CYCLOSILICATE 10 G PO PACK
10.0000 g | PACK | Freq: Two times a day (BID) | ORAL | Status: AC
  Administered 2020-01-31: 10 g via ORAL
  Filled 2020-01-31: qty 1

## 2020-01-31 NOTE — Evaluation (Signed)
Clinical/Bedside Swallow Evaluation Patient Details  Name: Bob Vazquez MRN: [id number] Date of Birth: 1941/03/13  Today's Date: 01/31/2020 Time: SLP Start Time (ACUTE ONLY): 1400 SLP Stop Time (ACUTE ONLY): 1410 SLP Time Calculation (min) (ACUTE ONLY): 10 min  Past Medical History:  Past Medical History:  Diagnosis Date  . Chronic bronchitis ([place])   . Hyperlipemia   . Hypertension   . Migraines    "q now and then"  . NPH (normal pressure hydrocephalus) ([place])   . Prostatic abscess 04/12/14   transurethral resection of prostatic abscess  . Type II diabetes mellitus ([place])    Past Surgical History:  Past Surgical History:  Procedure Laterality Date  . 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 TONSILLECTOMY  ~ 1980  . TRANSURETHRAL RESECTION OF PROSTATE N/A 04/12/2014   Procedure: TRANSURETHRAL RESECTION OF THE PROSTATE (TURP) Abscess;  Surgeon: Irine Seal, MD;  Location: [facility];  Service: Urology;  Laterality: N/A;  gyrus instruments   HPI:  Bob Vazquez is a 79 year old male with a history of type 2 diabetes, hypertension, hyperlipidemia, NPH who lives alone and family had not heard from him in several days.  Law enforcement did a wellness check and found patient in an empty bathtub.  No signs of trauma.  Patient has been awake though confused and was found to have UTI, likely sepsis and DKA and also Covid-19 positive.   Assessment / Plan / Recommendation Clinical Impression  Pt demonstrates immediate signs of aspiration with a moderate sized sip of water from a cup. Pt swallowed but then almost immediately gagged/coughed. Additionally pt complains of pain in his throat and after taking two bites of puree without signs of aspiration he reported severe chest pain (grimacing, gripping chest). RN aware. Concerned for risk of aspiration and underlying esophageal component. Recommend pt remain NPO except for necessary meds taken crushed in puree or teaspoon sips of liquid meds if needed.  Otherwise, SLP will f/u tomorrow for further observation and potential need for instrumental assessment.  SLP Visit Diagnosis: Dysphagia, unspecified (R13.10)    Aspiration Risk  Severe aspiration risk    Diet Recommendation NPO except meds   Medication Administration: Crushed with puree    Other  Recommendations     Follow up Recommendations 24 hour supervision/assistance;Skilled Nursing facility      Frequency and Duration            Prognosis        Swallow Study   General HPI: Bob Vazquez is a 79 year old male with a history of type 2 diabetes, hypertension, hyperlipidemia, NPH who lives alone and family had not heard from him in several days.  Law enforcement did a wellness check and found patient in an empty bathtub.  No signs of trauma.  Patient has been awake though confused and was found to have UTI, likely sepsis and DKA and also Covid-19 positive. Type of Study: Bedside Swallow Evaluation Previous Swallow Assessment: non ein chart Diet Prior to this Study: NPO Temperature Spikes Noted: No Respiratory Status: Nasal cannula History of Recent Intubation: No Behavior/Cognition: Alert;Cooperative;Pleasant mood Oral Cavity Assessment: Within Functional Limits Oral Care Completed by SLP: No Oral Cavity - Dentition: Poor condition;Missing dentition Vision: Functional for self-feeding Self-Feeding Abilities: Needs assist Patient Positioning: Upright in bed Baseline Vocal Quality: Normal Volitional Cough: Weak Volitional Swallow: Able to elicit    Oral/Motor/Sensory Function     Ice Chips     Thin Liquid Thin Liquid: Impaired Presentation: Cup Pharyngeal  Phase Impairments: Cough - Immediate    Nectar Thick Nectar Thick Liquid: Not tested   Honey Thick Honey Thick Liquid: Not tested   Puree Puree: Impaired Presentation: Spoon Pharyngeal Phase Impairments: Other (comments)(pain)   Solid     Solid: Not tested     Bob [place], MA CCC-SLP  Acute  Rehabilitation Services Pager [phone] Office [phone]  Bob Vazquez, Katherene Ponto 01/31/2020,2:47 PM

## 2020-01-31 NOTE — Plan of Care (Signed)
BG 87 while NPO.  D5W started at 50cc/hr.   Decrease lantus to 20 units BID  Julian Hy, DO 01/31/20 5:04 PM [facility]

## 2020-01-31 NOTE — Progress Notes (Signed)
NAME:  Bob Vazquez, MRN:  [id number], DOB:  1941/12/01, LOS: 2 ADMISSION DATE:  02/19/2020, CONSULTATION DATE:  01/31/20 REFERRING MD:  , CHIEF COMPLAINT:  AMS   Brief History   Bob Vazquez is a 79 year old male with a history of type 2 diabetes, hypertension, hyperlipidemia, NPH who lives alone and family had not heard from him in several days.  Law enforcement did a wellness check and found patient in an empty bathtub.  No signs of trauma.  Patient has been awake though confused and was found to have UTI, likely sepsis and DKA and also Covid-19 positive.  PCCM consulted for admission.  History of present illness   Bob Vazquez is a 79 year old male with a history of type 2 diabetes, hypertension, hyperlipidemia, NPH who lives alone and family had not heard from him in several days.   Law enforcement found patient naked in an empty bathtub without signs of trauma and awake, though confused.  Per family, he has been on for about 1 month and initial glucose was 450.  The patient is apparently cared for by his nephew who is hospitalized currently with Covid-19.  In the emergency department he is remained oriented to self only, found to have a fever of 101 Fahrenheit, tachycardia and lactic acidosis with UTI and COVID-19 with possible small basilar opacities and DKA.  He was covered with broad-spectrum antibiotics and given 3cc/kg IV fluids along with insulin drip. Potassium 7.0 with peaked t-waves on EKG.  On evaluation the patient is requiring 2 L of nasal cannula oxygen, he is moaning and gives no history.  CT head was negative.  He remains tachycardic but not hypotensive.   Past Medical History   has a past medical history of Chronic bronchitis ([place]), Hyperlipemia, Hypertension, Migraines, NPH (normal pressure hydrocephalus) ([place]), Prostatic abscess (04/12/14), and Type II diabetes mellitus ([place]).  Significant Hospital Events   2/5 Admit to PCCM  Consults:    Procedures:    Significant  Diagnostic Tests:  2/5 CXR>>Indistinct opacity at the lung bases could be atelectasis (lung volumes are low) or infectious infiltrates. 2/5 CT head>>There is a rightfrontal approach VP shunt with tip terminating in the right lateral ventricle. There is encephalomalacia along the course of the VP shunt which has slightly worsened since the prior study 2/6 CT renal stone protocol (personally reviewed)-bibasilar lung infiltrates, no hydronephrosis or renal/perinephric fat stranding  2/7 echo: LVEF 60-65%, severe LVH, grade 1 diastolic dysfunction, elevated LVEDP. Normal LA, RV, RA. Mild aortic sclerosis without stenosis. Moderate mitral annular calcification, otherwise normal valve.  Micro Data:  2/5 SARS-Covid-2>> positive 2/5 blood cultures x2>> 2/5 urine culture>> K. Pneumoniae, S. agalactiae   Antimicrobials:  Vancomycin 2/5-2/6 Cefepime 2/5- Flagyl 2/5 only  Interim history/subjective:  This morning he is thirsty and wants water. Overnight there was concern for aspiration when drinking clears due to desaturation episodes and coughing with drinking.  Objective   Blood pressure (!) 142/58, pulse 73, temperature 98.6 F (37 C), temperature source Axillary, resp. rate 15, height 5\' 10"  (1.778 m), weight 72.5 kg, SpO2 92 %.        Intake/Output Summary (Last 24 hours) at 01/31/2020 1517 Last data filed at 01/31/2020 1300 Gross per 24 hour  Intake 1333.7 ml  Output 1205 ml  Net 128.7 ml   Filed Weights   01/30/20 0000 01/30/20 0500 01/31/20 0450  Weight: 72.6 kg 72.6 kg 72.5 kg    General: elderly man laying in bed comfortably sleeping HEENT: [place]/AT,  eyes anicteric, oral mucosa dry Neuro: sleeping, arouses to verbal stimulation, answering questions appropraitely. Moving all extremities. Globally weak. CV: S1-S2, regular rate and rhythm PULM: Mild basilar rhonchi, breathing comfortably on 2 L nasal cannula.  No tachypnea or conversational dyspnea. GI: Soft, nontender,  nondistended Extremities: No peripheral edema, no cyanosis Skin: No rashes or wounds   Resolved Hospital Problem list     Assessment & Plan:   Diabetic ketoacidosis due to noncompliance with insulin regimen -Been out of insulin for over a month, sepsis likely also contributing -pH 7.24 and gap of 23 -Initial K7.0 with peak T waves, received insulin and calcium gluconate P: -increase lantus to home dose- 60 units TDD (30 units BID) -accuechecks Q4h with SSI PRN -monitor for hypoglycemia while NPO -goal BG 140-180 while admitted to ICU -needs diabetes education prior to discharge  Hyperkalemia due to DKA, AKI -lokelma BID today -recheck BMP tomorrow  Severe sepsis, suspect primarily due to urinary tract infection- K. Pneumoniae and Strep agalactiae -Covered broadly with Vanc, falgyl, and cefepime at admission P: -con't cefepime -con't to follow cultures   Dysphagia -SLP consult -NPO  Covid-19 pneumonia -Unclear how long patient has been infected and is requiring minimal oxygen supplementation, however patient is at high risk for severe disease therefore will cover with standard Covid treatment P: -Remdesivir and decadron per protocol -Con't precautions  -[place] heparin per covid protocol  Nonoliguric AKI on CKD - possibly ATN from prolonged pre-renal state vs rhabdo -last creatinine 6 yrs ago 1.7,  Today up 4.0 likely secondary to sepsis and volume depletion P: -serial BMP, CK -avoid nephrotoxic meds, renally dose meds -strict I/O -1L LR  Acute metabolic encephalopathy, related to renal failure and sepsis -suspect metabolic and secondary to the above, per family is alert and oriented at baseline -CT head negative P: -renally dose meds -avoid sedating meds -managing electrolyte abnormalities  Rhabdomyolysis -recheck CK  Hypertension/ hyperlipidemia -hold Norvasc and statin in the setting of critical illness P: -holding PTA amlodipine until able to take PO; can  use IV hydralazine or labetalol if needed for hypertension.  Best practice:  Diet: NPO Pain/Anxiety/Delirium protocol (if indicated): N/A VAP protocol (if indicated): N/A DVT prophylaxis: Heparin GI prophylaxis: N/A Glucose control: Insulin drip Mobility: Bedrest Code Status: Full code Family Communication: updated sister [name] Disposition: ICU   Labs   CBC: Recent Labs  Lab 02/14/2020 1800 02/14/2020 1900 02/19/2020 2300  WBC 12.7*  --  10.4  NEUTROABS 11.6*  --   --   HGB 14.1 15.0 11.4*  HCT 45.0 44.0 34.1*  MCV 88.1  --  83.2  PLT 369  --  [id number]    Basic Metabolic Panel: Recent Labs  Lab 01/30/2020 2300 01/30/20 0014 01/30/20 0149 01/30/20 0549 01/30/20 1132 01/31/20 0720  NA 140  --  140 143 144 144  K 4.7  --  5.4* 5.8* 5.5* 5.5*  CL 107  --  103 107 107 111  CO2 20*  --  24 25 24  21*  GLUCOSE 438*  --  362* 204* 189* 182*  BUN 66*  --  73* 69* 63* 67*  CREATININE 3.17*  --  3.46* 3.23* 2.97* 2.59*  CALCIUM 7.9*  --  8.8* 8.9 8.7* 8.7*  MG 2.5*  --   --   --   --   --   PHOS  --  3.3  --   --   --   --    GFR: Estimated Creatinine Clearance: 23.3  mL/min (A) (by C-G formula based on SCr of 2.59 mg/dL (H)). Recent Labs  Lab 01/28/2020 1800 02/10/2020 2150 02/02/2020 2300  WBC 12.7*  --  10.4  LATICACIDVEN 5.8* 4.8*  --     Liver Function Tests: Recent Labs  Lab 01/30/20 1132 01/31/20 0857  AST 52* 46*  ALT 27 28  ALKPHOS 62 65  BILITOT 0.5 0.8  PROT 7.1 7.3  ALBUMIN 2.3* 2.2*   No results for input(s): LIPASE, AMYLASE in the last 168 hours. Recent Labs  Lab 02/06/2020 1800  AMMONIA 20    ABG    Component Value Date/Time   HCO3 20.6 02/04/2020 1900   TCO2 22 02/18/2020 1900   ACIDBASEDEF 7.0 (H) 02/07/2020 1900   O2SAT 92.0 02/13/2020 1900     Coagulation Profile: Recent Labs  Lab 02/11/2020 1800  INR 1.2    Cardiac Enzymes: Recent Labs  Lab 02/14/2020 1800 01/30/20 1132 01/31/20 0857  CKTOTAL 1,462* 1,493* 604*    HbA1C: Hgb A1c  MFr Bld  Date/Time Value Ref Range Status  07/31/2012 05:20 PM 9.4 (H) <5.7 % Final    Comment:    (NOTE)                                                                       According to the ADA Clinical Practice Recommendations for 2011, when HbA1c is used as a screening test:  >=6.5%   Diagnostic of Diabetes Mellitus           (if abnormal result is confirmed) 5.7-6.4%   Increased risk of developing Diabetes Mellitus References:Diagnosis and Classification of Diabetes Mellitus,Diabetes [id number] 1):S62-S69 and Standards of Medical Care in         Diabetes - 2011,Diabetes [id number] (Suppl 1):S11-S61.    CBG: Recent Labs  Lab 01/30/20 2011 01/30/20 2355 01/31/20 0400 01/31/20 0829 01/31/20 1203  GLUCAP 238* 244* 212* 160* 113*    This patient is critically ill with multiple organ system failure which requires frequent high complexity decision making, assessment, support, evaluation, and titration of therapies. This was completed through the application of advanced monitoring technologies and extensive interpretation of multiple databases. During this encounter critical care time was devoted to patient care services described in this note for 40 minutes.   Julian Hy, DO 01/31/20 3:35 PM [facility]

## 2020-02-01 ENCOUNTER — Inpatient Hospital Stay (HOSPITAL_COMMUNITY): Payer: Medicare Other

## 2020-02-01 DIAGNOSIS — E1122 Type 2 diabetes mellitus with diabetic chronic kidney disease: Secondary | ICD-10-CM

## 2020-02-01 LAB — COMPREHENSIVE METABOLIC PANEL
ALT: 26 U/L (ref 0–44)
AST: 40 U/L (ref 15–41)
Albumin: 2.1 g/dL — ABNORMAL LOW (ref 3.5–5.0)
Alkaline Phosphatase: 58 U/L (ref 38–126)
Anion gap: 10 (ref 5–15)
BUN: 57 mg/dL — ABNORMAL HIGH (ref 8–23)
CO2: 24 mmol/L (ref 22–32)
Calcium: 8.5 mg/dL — ABNORMAL LOW (ref 8.9–10.3)
Chloride: 109 mmol/L (ref 98–111)
Creatinine, Ser: 2.21 mg/dL — ABNORMAL HIGH (ref 0.61–1.24)
GFR calc Af Amer: 31 mL/min — ABNORMAL LOW (ref >60)
GFR calc non Af Amer: 27 mL/min — ABNORMAL LOW (ref >60)
Glucose, Bld: 215 mg/dL — ABNORMAL HIGH (ref 70–99)
Potassium: 5.4 mmol/L — ABNORMAL HIGH (ref 3.5–5.1)
Sodium: 143 mmol/L (ref 135–145)
Total Bilirubin: 0.6 mg/dL (ref 0.3–1.2)
Total Protein: 6.6 g/dL (ref 6.5–8.1)

## 2020-02-01 LAB — CBC
HCT: 36.1 % — ABNORMAL LOW (ref 39.0–52.0)
Hemoglobin: 11.9 g/dL — ABNORMAL LOW (ref 13.0–17.0)
MCH: 28.2 pg (ref 26.0–34.0)
MCHC: 33 g/dL (ref 30.0–36.0)
MCV: 85.5 fL (ref 80.0–100.0)
Platelets: 348 10*3/uL (ref 150–400)
RBC: 4.22 MIL/uL (ref 4.22–5.81)
RDW: 14.6 % (ref 11.5–15.5)
WBC: 10.3 10*3/uL (ref 4.0–10.5)
nRBC: 0 % (ref 0.0–0.2)

## 2020-02-01 LAB — GLUCOSE, CAPILLARY
Glucose-Capillary: 181 mg/dL — ABNORMAL HIGH (ref 70–99)
Glucose-Capillary: 236 mg/dL — ABNORMAL HIGH (ref 70–99)
Glucose-Capillary: 254 mg/dL — ABNORMAL HIGH (ref 70–99)
Glucose-Capillary: 170 mg/dL — ABNORMAL HIGH (ref 70–99)
Glucose-Capillary: 126 mg/dL — ABNORMAL HIGH (ref 70–99)

## 2020-02-01 LAB — URINE CULTURE: Culture: >=100000 — AB

## 2020-02-01 MED ORDER — INSULIN ASPART 100 UNIT/ML ~~LOC~~ SOLN
0.0000 [IU] | Freq: Three times a day (TID) | SUBCUTANEOUS | Status: DC
  Administered 2020-02-01: 8 [IU] via SUBCUTANEOUS
  Administered 2020-02-01: 3 [IU] via SUBCUTANEOUS
  Administered 2020-02-02 – 2020-02-03 (×3): 2 [IU] via SUBCUTANEOUS
  Administered 2020-02-03 – 2020-02-04 (×3): 3 [IU] via SUBCUTANEOUS
  Administered 2020-02-05: 2 [IU] via SUBCUTANEOUS

## 2020-02-01 MED ORDER — SODIUM ZIRCONIUM CYCLOSILICATE 10 G PO PACK
10.0000 g | PACK | Freq: Every day | ORAL | Status: DC
  Administered 2020-02-01 – 2020-02-02 (×2): 10 g via ORAL
  Filled 2020-02-01 (×2): qty 1

## 2020-02-01 MED ORDER — INSULIN GLARGINE 100 UNIT/ML ~~LOC~~ SOLN
25.0000 [IU] | Freq: Two times a day (BID) | SUBCUTANEOUS | Status: DC
  Administered 2020-02-01 – 2020-02-03 (×5): 25 [IU] via SUBCUTANEOUS
  Filled 2020-02-01 (×7): qty 0.25

## 2020-02-01 MED ORDER — INSULIN ASPART 100 UNIT/ML ~~LOC~~ SOLN: 0.0000 [IU] | Freq: Every day | SUBCUTANEOUS | Status: DC

## 2020-02-01 NOTE — Plan of Care (Signed)
  Problem: Education: Goal: Knowledge of General Education information will improve Description: Including pain rating scale, medication(s)/side effects and non-pharmacologic comfort measures Outcome: Progressing   Problem: Clinical Measurements: Goal: Diagnostic test results will improve Outcome: Progressing Goal: Respiratory complications will improve Outcome: Progressing Goal: Cardiovascular complication will be avoided Outcome: Progressing   Problem: Activity: Goal: Risk for activity intolerance will decrease Outcome: Progressing   Problem: Safety: Goal: Ability to remain free from injury will improve Outcome: Progressing   Problem: Skin Integrity: Goal: Risk for impaired skin integrity will decrease Outcome: Progressing

## 2020-02-01 NOTE — Progress Notes (Addendum)
NAME:  Bob Vazquez, MRN:  [id number], DOB:  06-20-1941, LOS: 3 ADMISSION DATE:  02/14/2020, CONSULTATION DATE:  02/01/20 REFERRING MD:  , CHIEF COMPLAINT:  AMS   Brief History   Bob Vazquez is a 79 year old male with a history of type 2 diabetes, hypertension, hyperlipidemia, NPH who lives alone and family had not heard from him in several days.  Law enforcement did a wellness check and found patient in an empty bathtub.  No signs of trauma.  Patient has been awake though confused and was found to have UTI, likely sepsis and DKA and also Covid-19 positive.  PCCM consulted for admission   Past Medical History   has a past medical history of Chronic bronchitis ([place]), Hyperlipemia, Hypertension, Migraines, NPH (normal pressure hydrocephalus) ([place]), Prostatic abscess (04/12/14), and Type II diabetes mellitus ([place]).  Significant Hospital Events   2/5 Admit to PCCM 2/6 through 2/8: Mental status slowly improving: Hemodynamically stable, anion gap closed.  Preliminary urine cultures growing GNR with Klebsiella pneumoniae, vancomycin discontinued on 2/7.  Starting diet on 2/8. Consults:    Procedures:    Significant Diagnostic Tests:  2/5 CXR>>Indistinct opacity at the lung bases could be atelectasis (lung volumes are low) or infectious infiltrates. 2/5 CT head>>There is a rightfrontal approach VP shunt with tip terminating in the right lateral ventricle. There is encephalomalacia along the course of the VP shunt which has slightly worsened since the prior study 2/6 CT renal stone protocol (personally reviewed)-bibasilar lung infiltrates, no hydronephrosis or renal/perinephric fat stranding 2/7 echo Left Ventricle: Left ventricular ejection fraction, by visual estimation,  60 to 65%. The left ventricle has normal function.. There is severely increased left ventricular hypertrophy of the basal septum. Grade I diastolic dysfunction (impaired relaxation). Elevated left ventricular end-diastolic   pressure. Right Ventricle: The right ventricular size is normal. No increase in  right ventricular wall thickness. Global RV systolic function is has normal systolic function.  Micro Data:  2/5 SARS-Covid-2>> positive 2/5 blood cultures x2>> 2/5 urine culture>>K pneumoniae and GNR (80K additional)>>>   Antimicrobials:  Vancomycin 2/5-2/6 Cefepime 2/5- Flagyl 2/5 only  Interim history/subjective:  He denies distress today.  No pain no shortness of breath no urinary symptoms  Objective   Blood pressure (Abnormal) 162/73, pulse (Abnormal) 56, temperature (Abnormal) 96.7 F (35.9 C), temperature source Axillary, resp. rate 11, height 5\' 10"  (1.778 m), weight 72.3 kg, SpO2 97 %.        Intake/Output Summary (Last 24 hours) at 02/01/2020 0824 Last data filed at 02/01/2020 0700 Gross per 24 hour  Intake 2122.51 ml  Output 1425 ml  Net 697.51 ml   Filed Weights   01/30/20 0500 01/31/20 0450 02/01/20 0412  Weight: 72.6 kg 72.5 kg 72.3 kg   General this is a 79 year old black male currently sitting up in bed he is in no acute distress HEENT normocephalic atraumatic no jugular venous distention appreciated mucous membranes are moist no thrush edentulous Pulmonary: No accessory use equal rise bilaterally bilateral posterior rales no rhonchi or wheezing currently on 2 L/min via nasal cannula saturating 99% Cardiac regular rate and rhythm without murmur rub or gallop Abdomen is soft not tender no organomegaly bowel sounds are present GU clear yellow, condom catheter in place Extremities warm and dry trace lower extremity edema brisk capillary refill pulses are palpable Neuro: Awake oriented x3, did have some trouble with year but knew the president was Biden, no motor deficits appreciated   Resolved Hospital Problem list  Assessment & Plan:   Diabetic ketoacidosis -Been out of insulin for over a month, sepsis likely also contributing Anion gap remains closed  P: Blood glucose  goal 140-180 while in the ICU Continuing every 4 hour Accu-Cheks Change Lantus to 25 units twice a day Will need diabetic educator prior to discharge, currently takes 60 units of Lantus daily at home, had run out of insulin for over a month prior to admission  Severe sepsis, suspect primarily due to urinary tract infection urinary culture pending preliminarily growing greater than 100,000 colonies of Klebsiella pneumoniae and 80,000 colonies of other gram-negative rods Wbc normalized  P: Cefepime day #4 of 8 Continue to trend fever and white blood cell curve  Hyperkalemia, persistent despite DKA resolution Plan Repeat Lokelma today  Covid-19 pneumonia -Unclear how long patient has been infected and is requiring minimal oxygen supplementation, however patient is at high risk for severe disease therefore will cover with standard Covid treatment P: Day #3 of 5 remdesivir Day #3 of 9 planned Decadron Continue supplemental oxygen for saturations greater than 88% Pulse oximetry Repeating inflammatory markers in the morning  Nonoliguric AKI on CKD - possibly ATN from prolonged pre-renal state vs rhabdo -last creatinine 6 yrs ago 1.7, peaked at 4.0 likely secondary to sepsis and volume depletion, now down to 2.201, this is continued to improve P: Avoid nephrotoxins Ensure adequate renal perfusion Renal dose medications Strict intake and output Trend chemistries  Painful swallowing Plan Esophagram   Acute metabolic encephalopathy -suspect metabolic and secondary to the above, per family is alert and oriented at baseline -CT head negative P: Supportive care Correct metabolic derangements  Rhabdomyolysis P: Holding statin Repeat a.m. CKs  Hypertension/ hyperlipidemia Norvasc was resumed on 2/7, blood pressure currently acceptable Echo demonstrated grade 1 diastolic dysfunction EF was 60 to 65% the RV function was normal P: Continue amlodipine 5 mg daily We will hold statin  while administering remdesivir, can resume assuming LFTs remain normal, and CKs normalized Best practice:  Diet: NPO, carb modified diet started 2/8 Pain/Anxiety/Delirium protocol (if indicated): N/A VAP protocol (if indicated): N/A DVT prophylaxis: Heparin GI prophylaxis: N/A Glucose control: AC/at bedtime started 2/8 Mobility: Out of bed effective 2/8 Code Status: Full code Family Communication: updated sister [place] Disposition: to [place] ACNP-BC [place] Pager # [phone] OR # [phone] if no answer

## 2020-02-01 NOTE — Evaluation (Signed)
Occupational Therapy Evaluation Patient Details Name: Bob Vazquez MRN: [id number] DOB: 10/15/1941 Today's Date: 02/01/2020    History of Present Illness 79 year old male with a history of type 2 diabetes, hypertension, hyperlipidemia, NPH (with VP shunt) who lives alone and family had not heard from him in several days.  Law enforcement did a wellness check 02/21/2020 and found patient in an empty bathtub.  No signs of trauma.  Patient has been awake though confused and was found to have UTI, likely sepsis and DKA and also Covid-19 positive. The patient is apparently cared for by his nephew who is hospitalized at [facility] with Covid-19.   Clinical Impression   This 79 y/o male presents with the above. PTA pt reports living alone, completing ADL and functional mobility independently. Pt mildly confused during this session, though pleasant and willing to work with therapies. Pt presenting with impaired cognition, decreased standing balance and overall weakness. Pt also with drop in BP with initial sitting/standing (see details in general comments below), returning to 128/70 end of session once positioned in recliner with LEs elevated. Pt currently requiring two person assist for safe completion of functional transfers, overall maxA for LB ADL and setup/supervision for self-feeding and grooming ADL in sitting. Pt on RA with SpO2 fluctuating 88% and greater (use of O2 probe on earlobe). He will benefit from continued acute OT services, given pt's current deficits and that pt lives alone recommend follow up therapy services in SNF setting after discharge. Will follow.     Follow Up Recommendations  SNF;Supervision/Assistance - 24 hour    Equipment Recommendations  3 in 1 bedside commode;Other (comment)(TBD in next venue)           Precautions / Restrictions Precautions Precautions: Fall;Other (comment) Precaution Comments: Airborne and Contact  Restrictions Weight Bearing Restrictions: No       Mobility Bed Mobility Overal bed mobility: Needs Assistance Bed Mobility: Supine to Sit     Supine to sit: Min assist     General bed mobility comments: pt able to get legs off EOB; assist to raise torso  Transfers Overall transfer level: Needs assistance Equipment used: 2 person hand held assist Transfers: Sit to/from [employer] Transfers Sit to Stand: Min assist;+2 physical assistance;+2 safety/equipment;From elevated surface         General transfer comment: pt attempted several times without success; assist for anterior wt-shift and initiate LE extension; repeated x2    Balance Overall balance assessment: Needs assistance Sitting-balance support: Feet supported Sitting balance-Leahy Scale: Fair   Postural control: Posterior lean Standing balance support: Bilateral upper extremity supported Standing balance-Leahy Scale: Poor Standing balance comment: leaning backwards with posterior calves supporting him against bed frame                           ADL either performed or assessed with clinical judgement   ADL Overall ADL's : Needs assistance/impaired Eating/Feeding: Set up;Sitting Eating/Feeding Details (indicate cue type and reason): setup to open containers Grooming: Wash/dry hands;Set up;Sitting   Upper Body Bathing: Min guard;Set up;Sitting   Lower Body Bathing: Moderate assistance;+2 for physical assistance;+2 for safety/equipment;Sit to/from stand   Upper Body Dressing : Min guard;Minimal assistance;Sitting   Lower Body Dressing: Moderate assistance;Maximal assistance;+2 for physical assistance;+2 for safety/equipment;Sit to/from stand   Toilet Transfer: Minimal assistance;+2 for physical assistance;+2 for safety/equipment;Stand-pivot Toilet Transfer Details (indicate cue type and reason): simulated via transfer to [place] and Hygiene: Moderate assistance;+2 for  physical assistance;+2 for  safety/equipment;Sit to/from stand       Functional mobility during ADLs: Minimal assistance;+2 for physical assistance;+2 for safety/equipment(HHA) General ADL Comments: pt with impaired cognition, weakness, and with limitations due to orthostatic BP with functional transfers     Vision         Perception     Praxis      Pertinent Vitals/Pain Pain Assessment: No/denies pain     Hand Dominance Right   Extremity/Trunk Assessment Upper Extremity Assessment Upper Extremity Assessment: Generalized weakness   Lower Extremity Assessment Lower Extremity Assessment: Defer to PT evaluation   Cervical / Trunk Assessment Cervical / Trunk Assessment: Kyphotic   Communication Communication Communication: No difficulties   Cognition Arousal/Alertness: Awake/alert Behavior During Therapy: WFL for tasks assessed/performed Overall Cognitive Status: No family/caregiver present to determine baseline cognitive functioning                                 General Comments: pt oriented to person,place, (required cues for month, year)   General Comments  Patient reporting dizziness with standing and had to sit back down on bed. BP 87/76 seated; after UE/LE exercises 109/59; after transfer to recliner 91/47 (feet down); elevated legs, BP 128/70     Exercises Exercises: Other exercises Other Exercises Other Exercises: Patient returned to sitting after dizziness and completed hand pumps, "punches", LAQ and BP reassessed with improvement.    Shoulder Instructions      Home Living Family/patient expects to be discharged to:: Private residence Living Arrangements: Alone Available Help at Discharge: Family(nephew normally, but currently @ [facility] with COVID) Type of Home: House Home Access: Level entry     Home Layout: One level     Bathroom Shower/Tub: Teacher, early years/pre: Standard         Additional Comments: did not assess current Home equipment       Prior Functioning/Environment Level of Independence: Independent        Comments: reports he does not use device to walk; gets down into tub to bathe        OT Problem List: Decreased strength;Decreased activity tolerance;Impaired balance (sitting and/or standing);Decreased cognition;Decreased safety awareness;Cardiopulmonary status limiting activity      OT Treatment/Interventions: Self-care/ADL training;Therapeutic exercise;Energy conservation;Therapeutic activities;Patient/family education;Cognitive remediation/compensation;Balance training    OT Goals(Current goals can be found in the care plan section) Acute Rehab OT Goals Patient Stated Goal: get stronger OT Goal Formulation: With patient Time For Goal Achievement: 02-27-20 Potential to Achieve Goals: Good  OT Frequency: Min 2X/week   Barriers to D/C:            Co-evaluation PT/OT/SLP Co-Evaluation/Treatment: Yes Reason for Co-Treatment: For patient/therapist safety;To address functional/ADL transfers   OT goals addressed during session: ADL's and self-care      AM-PAC OT "6 Clicks" Daily Activity     Outcome Measure Help from another person eating meals?: A Little Help from another person taking care of personal grooming?: A Little Help from another person toileting, which includes using toliet, bedpan, or urinal?: A Lot Help from another person bathing (including washing, rinsing, drying)?: A Lot Help from another person to put on and taking off regular upper body clothing?: A Little Help from another person to put on and taking off regular lower body clothing?: A Lot 6 Click Score: 15   End of Session Equipment Utilized During Treatment: Gait belt Nurse Communication: Mobility status  Activity Tolerance:   Patient left: in chair;with call bell/phone within reach;with chair alarm set  OT Visit Diagnosis: Muscle weakness (generalized) (M62.81);Unsteadiness on feet (R26.81);Other symptoms and signs involving  cognitive function                Time: [id number] OT Time Calculation (min): 33 min Charges:  OT General Charges $OT Visit: 1 Visit OT Evaluation $OT Eval Moderate Complexity: 1 Mod  Lou Cal, OT [employer] Pager [phone] Office [phone]   Raymondo Band 02/01/2020, 5:25 PM

## 2020-02-01 NOTE — Progress Notes (Signed)
  Speech Language Pathology Treatment: Dysphagia  Patient Details Name: Bob Vazquez MRN: [id number] DOB: [birth year] Today's Date: 02/01/2020 Time: [id number] SLP Time Calculation (min) (ACUTE ONLY): 21 min  Assessment / Plan / Recommendation Clinical Impression  F/u after yesterday's swallow assessment.  No further coughing noted with thin liquids, however, pt continues to hold his chest after all swallows (thin liquids, purees, solids) and c/o pain - he can't specify type (used the word "heartburn" after it was mentioned as a possibility), but his response is consistent. No further signs of aspiration during the swallow.  Recommend beginning a PO diet (carb/thin liquids). Spoke with NP, Jerrye Bushy re: potential for esophageal etiology.  Consider esophagram.  SLP will follow briefly for plan.     HPI HPI: Bob Vazquez is a 79 year old male with a history of type 2 diabetes, hypertension, hyperlipidemia, NPH who lives alone and family had not heard from him in several days.  Law enforcement did a wellness check and found patient in an empty bathtub.  No signs of trauma.  Patient has been awake though confused and was found to have UTI, likely sepsis and DKA and also Covid-19 positive.      SLP Plan  Continue with current plan of care       Recommendations  Diet recommendations: Regular;Thin liquid Liquids provided via: Cup;Straw Medication Administration: Crushed with puree Supervision: Patient able to self feed                Oral Care Recommendations: Oral care BID SLP Visit Diagnosis: Dysphagia, unspecified (R13.10) Plan: Continue with current plan of care       GO              Arieonna Medine L. Tivis Ringer, [place] CCC/SLP Acute Rehabilitation Services Office number [phone] Pager [phone]   [name] 02/01/2020, 10:30 AM

## 2020-02-01 NOTE — Progress Notes (Signed)
Patient transferred to 5W-12 from [place]. Alert and oriented to person, place, and situation at time of transfer without any signs of acute distress. Patient on 2L O2 at transfer, was able to wean to 1L then to RA. Denies shortness of breath or pain at this time. Will continue to monitor and treat per MD orders.

## 2020-02-01 NOTE — Plan of Care (Signed)
Discussed Bob Vazquez's care with Dr. Cyndia Skeeters, who will assume his care in the morning.  Julian Hy, DO 02/01/20 5:26 PM [facility]

## 2020-02-01 NOTE — Evaluation (Signed)
Physical Therapy Evaluation Patient Details Name: Bob Vazquez MRN: [id number] DOB: Jan 05, 1941 Today's Date: 02/01/2020   History of Present Illness  79 year old male with a history of type 2 diabetes, hypertension, hyperlipidemia, NPH (with VP shunt) who lives alone and family had not heard from him in several days.  Law enforcement did a wellness check 01/30/2020 and found patient in an empty bathtub.  No signs of trauma.  Patient has been awake though confused and was found to have UTI, likely sepsis and DKA and also Covid-19 positive. The patient is apparently cared for by his nephew who is hospitalized at [facility] with Covid-19.  Clinical Impression   Pt admitted with above diagnosis. Patient previously lived alone and reports he was independent with ambulation and still got down into tub for his baths. (He intentionally had gotten into tub and then could not get up--see HPI). Currently he is limited by orthostasis and posterior losses of balance requiring 2 person assist to transfer and move bed to chair.  Pt currently with functional limitations due to the deficits listed below (see PT Problem List). Pt will benefit from skilled PT to increase their independence and safety with mobility to allow discharge to the venue listed below.       Follow Up Recommendations SNF    Equipment Recommendations  Other (comment)(TBD at next venue)    Recommendations for Other Services       Precautions / Restrictions Precautions Precautions: Fall;Other (comment) Precaution Comments: Airborne and Contact  Restrictions Weight Bearing Restrictions: No      Mobility  Bed Mobility Overal bed mobility: Needs Assistance Bed Mobility: Supine to Sit     Supine to sit: Min assist     General bed mobility comments: pt able to get legs off EOB; assist to raise torso  Transfers Overall transfer level: Needs assistance Equipment used: 2 person hand held assist Transfers: Sit to/from Stand;Stand Pivot  Transfers Sit to Stand: Min assist;+2 physical assistance;+2 safety/equipment;From elevated surface         General transfer comment: pt attempted several times without success; assist for anterior wt-shift and initiate LE extension; repeated x2  Ambulation/Gait Ambulation/Gait assistance: Min assist;+2 physical assistance;+2 safety/equipment Gait Distance (Feet): 3 Feet Assistive device: 2 person hand held assist Gait Pattern/deviations: Step-to pattern;Decreased stride length;Shuffle;Leaning posteriorly     General Gait Details: posterior lean upon standing with some improvement as walking to chair  Stairs            Wheelchair Mobility    Modified Rankin (Stroke Patients Only)       Balance Overall balance assessment: Needs assistance Sitting-balance support: Feet supported Sitting balance-Leahy Scale: Fair   Postural control: Posterior lean Standing balance support: Bilateral upper extremity supported Standing balance-Leahy Scale: Poor Standing balance comment: leaning backwards with posterior calves supporting him against bed frame                             Pertinent Vitals/Pain Pain Assessment: No/denies pain    Home Living Family/patient expects to be discharged to:: Private residence Living Arrangements: Alone Available Help at Discharge: Family(nephew normally, but currently @ [facility] with COVID) Type of Home: House Home Access: Level entry     Home Layout: One level   Additional Comments: did not assess current Home equipment    Prior Function Level of Independence: Independent         Comments: reports he does not use device to  walk; gets down into tub to bathe     Hand Dominance   Dominant Hand: Right    Extremity/Trunk Assessment   Upper Extremity Assessment Upper Extremity Assessment: Defer to OT evaluation    Lower Extremity Assessment Lower Extremity Assessment: Generalized weakness    Cervical / Trunk  Assessment Cervical / Trunk Assessment: Kyphotic  Communication   Communication: No difficulties  Cognition Arousal/Alertness: Awake/alert Behavior During Therapy: WFL for tasks assessed/performed Overall Cognitive Status: No family/caregiver present to determine baseline cognitive functioning                                 General Comments: pt oriented to person,place, (required cues for month, year)      General Comments General comments (skin integrity, edema, etc.): Patient reporting dizziness with standing and had to sit back down on bed. BP 87/76 seated; after UE/LE exercises 109/59; after transfer to recliner 91/47 (feet down); elevated legs, BP 128/70     Exercises Other Exercises Other Exercises: Patient returned to sitting after dizziness and completed hand pumps, "punches", LAQ and BP reassessed with improvement.    Assessment/Plan    PT Assessment Patient needs continued PT services  PT Problem List Decreased strength;Decreased activity tolerance;Decreased balance;Decreased mobility;Decreased knowledge of use of DME;Decreased safety awareness;Decreased knowledge of precautions;Cardiopulmonary status limiting activity       PT Treatment Interventions DME instruction;Gait training;Functional mobility training;Therapeutic activities;Therapeutic exercise;Balance training;Cognitive remediation;Patient/family education    PT Goals (Current goals can be found in the Care Plan section)  Acute Rehab PT Goals Patient Stated Goal: get stronger PT Goal Formulation: With patient Time For Goal Achievement: 02/29/20 Potential to Achieve Goals: Good    Frequency Min 2X/week   Barriers to discharge Decreased caregiver support      Co-evaluation               AM-PAC PT "6 Clicks" Mobility  Outcome Measure Help needed turning from your back to your side while in a flat bed without using bedrails?: A Little Help needed moving from lying on your back to  sitting on the side of a flat bed without using bedrails?: A Lot Help needed moving to and from a bed to a chair (including a wheelchair)?: A Lot Help needed standing up from a chair using your arms (e.g., wheelchair or bedside chair)?: A Lot Help needed to walk in hospital room?: Total Help needed climbing 3-5 steps with a railing? : Total 6 Click Score: 11    End of Session Equipment Utilized During Treatment: Gait belt Activity Tolerance: Treatment limited secondary to medical complications (Comment) Patient left: in chair;with call bell/phone within reach;with chair alarm set Nurse Communication: Mobility status;Other (comment)(orthostasis with standing) PT Visit Diagnosis: Unsteadiness on feet (R26.81);Muscle weakness (generalized) (M62.81)    Time: [id number] PT Time Calculation (min) (ACUTE ONLY): 33 min   Charges:   PT Evaluation $PT Eval Low Complexity: 1 Low           [name], PT Pager [phone]   Rexanne Mano 02/01/2020, 3:43 PM

## 2020-02-02 DIAGNOSIS — A419 Sepsis, unspecified organism: Secondary | ICD-10-CM

## 2020-02-02 DIAGNOSIS — R6520 Severe sepsis without septic shock: Secondary | ICD-10-CM

## 2020-02-02 DIAGNOSIS — I10 Essential (primary) hypertension: Secondary | ICD-10-CM

## 2020-02-02 LAB — CK: Total CK: 200 U/L (ref 49–397)

## 2020-02-02 LAB — GLUCOSE, CAPILLARY
Glucose-Capillary: 96 mg/dL (ref 70–99)
Glucose-Capillary: 129 mg/dL — ABNORMAL HIGH (ref 70–99)
Glucose-Capillary: 129 mg/dL — ABNORMAL HIGH (ref 70–99)
Glucose-Capillary: 144 mg/dL — ABNORMAL HIGH (ref 70–99)

## 2020-02-02 LAB — COMPREHENSIVE METABOLIC PANEL
ALT: 26 U/L (ref 0–44)
AST: 34 U/L (ref 15–41)
Albumin: 2.1 g/dL — ABNORMAL LOW (ref 3.5–5.0)
Alkaline Phosphatase: 56 U/L (ref 38–126)
Anion gap: 12 (ref 5–15)
BUN: 55 mg/dL — ABNORMAL HIGH (ref 8–23)
CO2: 23 mmol/L (ref 22–32)
Calcium: 8.3 mg/dL — ABNORMAL LOW (ref 8.9–10.3)
Chloride: 106 mmol/L (ref 98–111)
Creatinine, Ser: 1.99 mg/dL — ABNORMAL HIGH (ref 0.61–1.24)
GFR calc Af Amer: 36 mL/min — ABNORMAL LOW (ref >60)
GFR calc non Af Amer: 31 mL/min — ABNORMAL LOW (ref >60)
Glucose, Bld: 90 mg/dL (ref 70–99)
Potassium: 4.6 mmol/L (ref 3.5–5.1)
Sodium: 141 mmol/L (ref 135–145)
Total Bilirubin: 0.7 mg/dL (ref 0.3–1.2)
Total Protein: 6.5 g/dL (ref 6.5–8.1)

## 2020-02-02 LAB — LACTATE DEHYDROGENASE: LDH: 325 U/L — ABNORMAL HIGH (ref 98–192)

## 2020-02-02 LAB — C-REACTIVE PROTEIN: CRP: 12.7 mg/dL — ABNORMAL HIGH (ref <1.0)

## 2020-02-02 LAB — D-DIMER, QUANTITATIVE: D-Dimer, Quant: 2.09 ug/mL-FEU — ABNORMAL HIGH (ref 0.00–0.50)

## 2020-02-02 MED ORDER — SODIUM CHLORIDE 0.9 % IV SOLN
1.0000 g | INTRAVENOUS | Status: AC
  Administered 2020-02-02 – 2020-02-04 (×3): 1 g via INTRAVENOUS
  Filled 2020-02-02 (×3): qty 10

## 2020-02-02 MED ORDER — ALBUTEROL SULFATE HFA 108 (90 BASE) MCG/ACT IN AERS
2.0000 | INHALATION_SPRAY | RESPIRATORY_TRACT | Status: DC | PRN
  Administered 2020-02-10 – 2020-02-12 (×3): 2 via RESPIRATORY_TRACT
  Filled 2020-02-02: qty 6.7

## 2020-02-02 MED ORDER — ONDANSETRON HCL 4 MG/2ML IJ SOLN
4.0000 mg | Freq: Four times a day (QID) | INTRAMUSCULAR | Status: DC | PRN
  Administered 2020-02-04: 4 mg via INTRAVENOUS
  Filled 2020-02-02: qty 2

## 2020-02-02 MED ORDER — POLYETHYLENE GLYCOL 3350 17 G PO PACK: 17.0000 g | PACK | Freq: Every day | ORAL | Status: DC | PRN

## 2020-02-02 MED ORDER — FLUCONAZOLE 100MG IVPB
100.0000 mg | INTRAVENOUS | Status: DC
  Administered 2020-02-02 – 2020-02-05 (×4): 100 mg via INTRAVENOUS
  Filled 2020-02-02 (×6): qty 50

## 2020-02-02 MED ORDER — ACETAMINOPHEN 325 MG PO TABS
650.0000 mg | ORAL_TABLET | Freq: Four times a day (QID) | ORAL | Status: DC | PRN
  Administered 2020-02-04 – 2020-02-12 (×3): 650 mg via ORAL
  Filled 2020-02-02 (×3): qty 2

## 2020-02-02 NOTE — NC FL2 (Signed)
[place] LEVEL OF CARE SCREENING TOOL     IDENTIFICATION  Patient Name: Bob Vazquez Birthdate: 14-Jun-1941 Sex: male Admission Date (Current Location): 02/05/2020  [facility] and [place] Number:  Herbalist and Address:  [facility], [place] [address] [place], [place] [address]      Provider Number: [id number]  Attending Physician Name and Address:  Jonetta Osgood, MD  Relative Name and Phone Number:  [place], sister, [phone]    Current Level of Care: Hospital Recommended Level of Care: [place] Prior Approval Number:    Date Approved/Denied:   PASRR Number: [id number]  Discharge Plan: SNF    Current Diagnoses: Patient Active Problem List   Diagnosis Date Noted  . Sepsis ([place]) 02/14/2020  . 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 Altered mental status 07/31/2012  . DM (diabetes mellitus) ([place]) 07/31/2012  . Acute renal failure ([place]) 07/31/2012  . Weakness of right leg 07/31/2012  . Hyponatremia 07/31/2012  . Hypertension   . Hyperlipemia   . NPH (normal pressure hydrocephalus) ([facility])     Orientation RESPIRATION BLADDER Height & Weight     Self, Situation, Place  Normal Incontinent, External catheter Weight: 161 lb 6 oz (73.2 kg) Height:  5\' 10"  (177.8 cm)  BEHAVIORAL SYMPTOMS/MOOD NEUROLOGICAL BOWEL NUTRITION STATUS      Incontinent Diet(Please see DC Summary)  AMBULATORY STATUS COMMUNICATION OF NEEDS Skin   Extensive Assist Verbally Normal                       Personal Care Assistance Level of Assistance  Feeding, Bathing, Dressing Bathing Assistance: Maximum assistance Feeding assistance: Independent Dressing Assistance: Limited assistance     Functional Limitations Info  Sight, Hearing, Speech Sight Info: Adequate Hearing Info: Adequate Speech Info: Adequate    SPECIAL CARE FACTORS FREQUENCY  PT (By licensed PT), OT (By licensed OT)     PT Frequency: 5x/week OT Frequency:  5x/week            Contractures Contractures Info: Not present    Additional Factors Info  Code Status, Allergies, Insulin Sliding Scale, Isolation Precautions Code Status Info: Full Allergies Info: Penicillins   Insulin Sliding Scale Info: See dc summary Isolation Precautions Info: COVID +     Current Medications (02/02/2020):  This is the current hospital active medication list Current Facility-Administered Medications  Medication Dose Route Frequency Provider Last Rate Last Admin  . amLODipine (NORVASC) tablet 5 mg  5 mg Oral Daily Erick Colace, NP   5 mg at 02/02/20 [id number]  . aspirin EC tablet 81 mg  81 mg Oral QHS Erick Colace, NP   81 mg at 02/01/20 2152  . ceFEPIme (MAXIPIME) 2 g in sodium chloride 0.9 % 100 mL IVPB  2 g Intravenous Q24H Erick Colace, NP 200 mL/hr at 02/01/20 1712 2 g at 02/01/20 1712  . Chlorhexidine Gluconate Cloth 2 % PADS 6 each  6 each Topical Daily Erick Colace, NP   6 each at 01/31/20 1645  . dexamethasone (DECADRON) injection 6 mg  6 mg Intravenous Q24H Erick Colace, NP   6 mg at 02/01/20 2150  . heparin injection 7,500 Units  7,500 Units Subcutaneous Q8H Erick Colace, NP   7,500 Units at 02/02/20 0530  . insulin aspart (novoLOG) injection 0-15 Units  0-15 Units Subcutaneous TID WC Erick Colace, NP   3 Units at 02/01/20 1708  . insulin  aspart (novoLOG) injection 0-5 Units  0-5 Units Subcutaneous QHS Erick Colace, NP      . insulin glargine (LANTUS) injection 25 Units  25 Units Subcutaneous BID Erick Colace, NP   25 Units at 02/02/20 [phone]  . sodium zirconium cyclosilicate (LOKELMA) packet 10 g  10 g Oral Daily Erick Colace, NP   10 g at 02/02/20 [id number]     Discharge Medications: Please see discharge summary for a list of discharge medications.  Relevant Imaging Results:  Relevant Lab Results:   Additional Information SSN: Lyman [place], [place]

## 2020-02-02 NOTE — Care Management (Signed)
CM was able to speak with pt via phone with the assistance of pts nurse.  Per nurse pt is oriented x 3 with less periods of confusion per today's assessment .  CM explained the recommendation of SNF and pt is also in agreement.

## 2020-02-02 NOTE — Progress Notes (Signed)
Physical Therapy Treatment Patient Details Name: Bob Vazquez MRN: [id number] DOB: 10-21-1941 Today's Date: 02/02/2020    History of Present Illness 79 year old male with a history of type 2 diabetes, hypertension, hyperlipidemia, NPH (with VP shunt) who lives alone and family had not heard from him in several days.  Law enforcement did a wellness check 02/05/2020 and found patient in an empty bathtub.  No signs of trauma.  Patient has been awake though confused and was found to have UTI, likely sepsis and DKA and also Covid-19 positive. The patient is apparently cared for by his nephew who is hospitalized at [facility] with Covid-19.    PT Comments    Patient eager to get out of bed, despite reports of back pain. He reports the pain worsens every time he swallows and states the pain is the worst with swallowing (even compared to with mobility/exercises). RN in during session and states MD is aware.   Patient again orthostatic with standing and limited to pivot to recliner (not able to walk). May need TED hose if orthostasis persists. RN made aware.    Follow Up Recommendations  SNF     Equipment Recommendations  Other (comment)(TBD at next venue)    Recommendations for Other Services       Precautions / Restrictions Precautions Precautions: Fall;Other (comment) Precaution Comments: Airborne and Contact  Restrictions Weight Bearing Restrictions: No    Mobility  Bed Mobility Overal bed mobility: Needs Assistance Bed Mobility: Supine to Sit     Supine to sit: Min guard;HOB elevated     General bed mobility comments: nearly needed assist to raise torso; incr time and effort  Transfers Overall transfer level: Needs assistance Equipment used: 2 person hand held assist Transfers: Sit to/from [employer] Transfers Sit to Stand: Min assist;+2 physical assistance;+2 safety/equipment         General transfer comment: stood for BP assessment and too dizzy for BP to finish before  returned to sitting (+symptomatic orthostasis); after recovered stand pivot to chair with feet immediately elevated  Ambulation/Gait                 Stairs             Wheelchair Mobility    Modified Rankin (Stroke Patients Only)       Balance Overall balance assessment: Needs assistance Sitting-balance support: Feet supported Sitting balance-Leahy Scale: Fair   Postural control: Posterior lean Standing balance support: Bilateral upper extremity supported Standing balance-Leahy Scale: Poor Standing balance comment: leaning backwards with posterior calves supporting him against bed frame                            Cognition Arousal/Alertness: Awake/alert Behavior During Therapy: WFL for tasks assessed/performed Overall Cognitive Status: No family/caregiver present to determine baseline cognitive functioning                                 General Comments: pt oriented to person,place, (required cues for month, year)      Exercises Other Exercises Other Exercises: Patient returned to sitting after dizziness and completed hand pumps, "punches", reported incr back pain with UE movement, but states it hurts most when he swallows    General Comments General comments (skin integrity, edema, etc.): +drop in BP (MAP 73 to 61)      Pertinent Vitals/Pain Pain Assessment: Faces Faces Pain Scale: Hurts  even more Pain Location: back Pain Descriptors / Indicators: Grimacing Pain Intervention(s): Limited activity within patient's tolerance;Monitored during session;Repositioned;Other (comment)(RN made aware; pain increases with swallowing)    Home Living                      Prior Function            PT Goals (current goals can now be found in the care plan section) Acute Rehab PT Goals Patient Stated Goal: get stronger Time For Goal Achievement: 2020-03-07 Potential to Achieve Goals: Good Progress towards PT goals: Not  progressing toward goals - comment(orthostasis)    Frequency    Min 2X/week      PT Plan Current plan remains appropriate    Co-evaluation              AM-PAC PT "6 Clicks" Mobility   Outcome Measure  Help needed turning from your back to your side while in a flat bed without using bedrails?: A Little Help needed moving from lying on your back to sitting on the side of a flat bed without using bedrails?: A Little Help needed moving to and from a bed to a chair (including a wheelchair)?: A Lot Help needed standing up from a chair using your arms (e.g., wheelchair or bedside chair)?: A Lot Help needed to walk in hospital room?: Total Help needed climbing 3-5 steps with a railing? : Total 6 Click Score: 12    End of Session Equipment Utilized During Treatment: Gait belt Activity Tolerance: Treatment limited secondary to medical complications (Comment) Patient left: in chair;with call bell/phone within reach;with chair alarm set;with nursing/sitter in room Nurse Communication: Mobility status;Other (comment)(orthostasis with standing) PT Visit Diagnosis: Unsteadiness on feet (R26.81);Muscle weakness (generalized) (M62.81)     Time: [id number] PT Time Calculation (min) (ACUTE ONLY): 26 min  Charges:  $Therapeutic Activity: 23-37 mins                      [name], PT Pager [phone]    Rexanne Mano 02/02/2020, 2:42 PM

## 2020-02-02 NOTE — TOC Initial Note (Addendum)
Transition of Care [facility]) - Initial/Assessment Note    Patient Details  Name: Bob Vazquez MRN: [id number] Date of Birth: 02/10/1941  Transition of Care [facility]) CM/SW Contact:    Bob Labrador, RN Phone Number: 02/02/2020, 10:47 AM  Clinical Narrative:                  PTA independent from home alone. Per nurse pt has periods of confusion.   Recommendations for SNF at discharge.  CM contacted pts sister Bob Vazquez (pts brother Bob Vazquez is also hospitalized at [facility] with COVID).  Sister informed CM that pt lives alone, is not married and has a daughter but she is not in his life - no contact information for daughter.  Pt's sister connected call with pt's nephew Bob Vazquez. Bob Vazquez informed CM that he will look in on his uncle once discharged from facility.  Both sister and nephew are in agreement with pt discharging to SNF - both requested TOC to not reach out to [place] as he is not yet aware of his brothers condition and family would like to communicate it directly.  CM explained process of SNF and work up will be completed.   Expected Discharge Plan: Skilled Nursing Facility Barriers to Discharge: Continued Medical Work up   Patient Goals and CMS Choice     Choice offered to / list presented to : Sibling  Expected Discharge Plan and Services Expected Discharge Plan: [place]       Living arrangements for the past 2 months: Single Family Home                                      Prior Living Arrangements/Services Living arrangements for the past 2 months: Single Family Home Lives with:: Self Patient language and need for interpreter reviewed:: Yes        Need for Family Participation in Patient Care: Yes (Comment) Care giver support system in place?: No (comment)   Criminal Activity/Legal Involvement Pertinent to Current Situation/Hospitalization: No - Comment as needed  Activities of Daily Living      Permission Sought/Granted   Permission granted to  share information with : Yes, Verbal Permission Granted(Pt is disoriented - next of kin found was sister - sister gave verbal consent)              Emotional Assessment       Orientation: : Fluctuating Orientation (Suspected and/or reported [employer])      Admission diagnosis:  Acute respiratory failure with hypoxia ([place]) [J96.01] Sepsis ([place]) [A41.9] Diabetic ketoacidosis without coma associated with type 2 diabetes mellitus ([place]) [E11.10] Sepsis with acute renal failure without septic shock, due to unspecified organism, unspecified acute renal failure type ([place]) [A41.9, R65.20, N17.9] COVID toes [U07.1, R23.8] Patient Active Problem List   Diagnosis Date Noted  . Sepsis ([place]) 02/07/2020  . 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 Altered mental status 07/31/2012  . DM (diabetes mellitus) ([place]) 07/31/2012  . Acute renal failure ([place]) 07/31/2012  . Weakness of right leg 07/31/2012  . Hyponatremia 07/31/2012  . Hypertension   . Hyperlipemia   . NPH (normal pressure hydrocephalus) ([place])    PCP:  Nolene Ebbs, MD Pharmacy:   [id number] - [place], [place] [id number]  [place] [phone] Phone: [phone] Fax: [place], [place] -  [address] [place] [address] Phone: [phone] Fax: [phone]     Social Determinants of Health (SDOH) Interventions    Readmission Risk Interventions No flowsheet data found.

## 2020-02-02 NOTE — Progress Notes (Signed)
PROGRESS NOTE                                                                                                                                                                                                             Patient Demographics:    Bob Vazquez, is a 79 y.o. male, DOB - 06/11/1941, :[id number]  Outpatient Primary MD for the patient is Bob Ebbs, MD   Admit date - 01/28/2020   LOS - 4  Chief Complaint  Patient presents with   Altered Mental Status   Hyperglycemia       Brief Narrative: Patient is a 79 y.o. male with PMHx of DM-2, HTN, HLD, NPH-who was found very confused in an empty bathtub on a wellness check by law enforcement (family had not heard from him for several days)-he was subsequently brought to the ED-and was found to have acute metabolic encephalopathy in the setting of severe sepsis due to UTI, DKA and COVID-19 pneumonia.  He was admitted to the [facility] clinical stability transferred to the [facility] on 2/9.   Subjective:    Bob Vazquez today does not have any complaints-he denies any chest pain or shortness of breath-he was lying comfortably in bed.   Assessment  & Plan :   Covid 19 Viral pneumonia: On room air this morning-overall improved-however CRP still significantly elevated-continue steroids/remdesivir and follow clinical course/inflammatory markers.  Fever: afebrile  O2 requirements:  SpO2: 96 % O2 Flow Rate (L/min): 1 L/min   COVID-19 Labs: Recent Labs    02/02/20 0437  DDIMER 2.09*  LDH 325*  CRP 12.7*    No results found for: BNP  No results for input(s): PROCALCITON in the last 168 hours.  Lab Results  Component Value Date   SARSCOV2NAA POSITIVE (A) 01/31/2020   [place] Not Detected 07/13/2019     COVID-19 Medications: Steroids: 2/5>> Remdesivir: 2/5>>  Prone/Incentive Spirometry: encouraged incentive spirometry use  3-4/hour.  DVT Prophylaxis  : Heparin at intermediate dosing.  Severe sepsis secondary to UTI: Sepsis physiology has resolved-cultures positive for Klebsiella pneumonia and Streptococcus agalactiae-blood cultures negative.  Will narrow down antibiotics further-stop cefepime-start ceftriaxone.  Acute metabolic encephalopathy: Secondary to DKA/AKI-improved-suspect patient not far from usual baseline.  AKI: Likely hemodynamically mediated-improving-appears euvolemic-continue supportive care.  Odynophagia: Appears to be mild-no obvious oral candidiasis-given persistent odynophagia-start Diflucan and  follow.  DKA: Resolved.  Unfortunately patient ran out of his insulin-and could not get it refilled-likely provoking behavior along with Covid/UTI.  Insulin-dependent DM-2: CBGs stable-continue Lantus 25 units twice daily and SSI.  HTN: Controlled with amlodipine  HLD: Statins on hold-recheck CK tomorrow.  Deconditioning/debility: Secondary to acute illness-PT/OT following-recommendations of SNF-social work consulted.  Consults  :  PCCM  Procedures  :  None  ABG:    Component Value Date/Time   HCO3 20.6 01/30/2020 1900   TCO2 22 02/10/2020 1900   ACIDBASEDEF 7.0 (H) 01/28/2020 1900   O2SAT 92.0 02/19/2020 1900    Vent Settings: N/A  Condition - Guarded  Family Communication  : Called brother-no answer-unable to leave voicemail.  Code Status :  Full Code  Diet :  Diet Order            Diet Carb Modified Fluid consistency: Thin; Room service appropriate? Yes  Diet effective now               Disposition Plan  :  Remain hospitalized  Barriers to discharge: Hypoxia requiring O2 supplementation/complete 5 days of IV Remdesivir  Antimicorbials  :    Anti-infectives (From admission, onward)   Start     Dose/Rate Route Frequency Ordered Stop   01/30/20 1830  ceFEPIme (MAXIPIME) 2 g in sodium chloride 0.9 % 100 mL IVPB     2 g 200 mL/hr over 30 Minutes Intravenous Every  24 hours 01/28/2020 1927     01/30/20 1000  remdesivir 100 mg in sodium chloride 0.9 % 100 mL IVPB     100 mg 200 mL/hr over 30 Minutes Intravenous Daily 02/18/2020 2258 02/02/20 0954   01/25/2020 2300  remdesivir 200 mg in sodium chloride 0.9% 250 mL IVPB     200 mg 580 mL/hr over 30 Minutes Intravenous Once 02/13/2020 2258 01/30/20 0102   02/12/2020 1929  vancomycin variable dose per unstable renal function (pharmacist dosing)  Status:  Discontinued      Does not apply See admin instructions 02/07/2020 1929 01/30/20 1429   02/05/2020 1730  ceFEPIme (MAXIPIME) 2 g in sodium chloride 0.9 % 100 mL IVPB     2 g 200 mL/hr over 30 Minutes Intravenous  Once 02/16/2020 1716 02/10/2020 1935   02/21/2020 1730  metroNIDAZOLE (FLAGYL) IVPB 500 mg     500 mg 100 mL/hr over 60 Minutes Intravenous  Once 02/19/2020 1716 02/21/2020 2000   01/31/2020 1730  vancomycin (VANCOCIN) IVPB 1000 mg/200 mL premix     1,000 mg 200 mL/hr over 60 Minutes Intravenous  Once 02/04/2020 1716 01/26/2020 2005      Inpatient Medications  Scheduled Meds:  amLODipine  5 mg Oral Daily   aspirin EC  81 mg Oral QHS   dexamethasone (DECADRON) injection  6 mg Intravenous Q24H   heparin  7,500 Units Subcutaneous Q8H   insulin aspart  0-15 Units Subcutaneous TID WC   insulin aspart  0-5 Units Subcutaneous QHS   insulin glargine  25 Units Subcutaneous BID   sodium zirconium cyclosilicate  10 g Oral Daily   Continuous Infusions:  ceFEPime (MAXIPIME) IV 2 g (02/01/20 1712)   PRN Meds:.   Time Spent in minutes  25  See all Orders from today for further details   Oren Binet M.D on 02/02/2020 at 1:18 PM  To page go to [email or web address] - use universal password  [facility] -  Office  [phone]    Objective:   Vitals:   02/01/20 1330  02/01/20 2005 02/02/20 0420 02/02/20 0522  BP:  135/64 136/61   Pulse: 83 92 82 79  Resp:  16 17   Temp:  97.7 F (36.5 C) 97.9 F (36.6 C)   TempSrc:  Oral Oral   SpO2: 93% 91% 97% 96%   Weight:    73.2 kg  Height:        Wt Readings from Last 3 Encounters:  02/02/20 73.2 kg  04/12/14 81.6 kg  11/12/13 81.6 kg     Intake/Output Summary (Last 24 hours) at 02/02/2020 1318 Last data filed at 02/02/2020 0924 Gross per 24 hour  Intake 440 ml  Output 250 ml  Net 190 ml     Physical Exam Gen Exam:Alert awake-not in any distress HEENT:atraumatic, normocephalic Chest: B/L clear to auscultation anteriorly CVS:S1S2 regular Abdomen:soft non tender, non distended Extremities:no edema Neurology: Non focal Skin: no rash   Data Review:    CBC Recent Labs  Lab 02/09/2020 1800 02/04/2020 1900 02/17/2020 2300 02/01/20 0410  WBC 12.7*  --  10.4 10.3  HGB 14.1 15.0 11.4* 11.9*  HCT 45.0 44.0 34.1* 36.1*  PLT 369  --  271 348  MCV 88.1  --  83.2 85.5  MCH 27.6  --  27.8 28.2  MCHC 31.3  --  33.4 33.0  RDW 14.0  --  13.7 14.6  LYMPHSABS 0.4*  --   --   --   MONOABS 0.6  --   --   --   EOSABS 0.0  --   --   --   BASOSABS 0.0  --   --   --     Chemistries  Recent Labs  Lab 01/27/2020 2300 01/30/20 0149 01/30/20 0549 01/30/20 1132 01/31/20 0720 01/31/20 0857 02/01/20 0410 02/02/20 0437  NA 140   < > 143 144 144  --  143 141  K 4.7   < > 5.8* 5.5* 5.5*  --  5.4* 4.6  CL 107   < > 107 107 111  --  109 106  CO2 20*   < > 25 24 21*  --  24 23  GLUCOSE 438*   < > 204* 189* 182*  --  215* 90  BUN 66*   < > 69* 63* 67*  --  57* 55*  CREATININE 3.17*   < > 3.23* 2.97* 2.59*  --  2.21* 1.99*  CALCIUM 7.9*   < > 8.9 8.7* 8.7*  --  8.5* 8.3*  MG 2.5*  --   --   --   --   --   --   --   AST  --   --   --  52*  --  46* 40 34  ALT  --   --   --  27  --  28 26 26   ALKPHOS  --   --   --  62  --  65 58 56  BILITOT  --   --   --  0.5  --  0.8 0.6 0.7   < > = values in this interval not displayed.   ------------------------------------------------------------------------------------------------------------------ No results for input(s): CHOL, HDL, LDLCALC, TRIG, CHOLHDL,  LDLDIRECT in the last 72 hours.  Lab Results  Component Value Date   HGBA1C 9.4 (H) 07/31/2012   ------------------------------------------------------------------------------------------------------------------ No results for input(s): TSH, T4TOTAL, T3FREE, THYROIDAB in the last 72 hours.  Invalid input(s): FREET3 ------------------------------------------------------------------------------------------------------------------ No results for input(s): VITAMINB12, FOLATE, FERRITIN, TIBC, IRON, RETICCTPCT in the last 72 hours.  Coagulation profile Recent Labs  Lab 02/03/2020 1800  INR 1.2    Recent Labs    02/02/20 0437  DDIMER 2.09*    Cardiac Enzymes No results for input(s): CKMB, TROPONINI, MYOGLOBIN in the last 168 hours.  Invalid input(s): CK ------------------------------------------------------------------------------------------------------------------ No results found for: BNP  Micro Results Recent Results (from the past 240 hour(s))  MRSA PCR Screening     Status: None   Collection Time: 02/03/2020 12:49 AM   Specimen: Nasopharyngeal  Result Value Ref Range Status   MRSA by PCR NEGATIVE NEGATIVE Final    Comment:        The GeneXpert MRSA Assay (FDA approved for NASAL specimens only), is one component of a comprehensive MRSA colonization surveillance program. It is not intended to diagnose MRSA infection nor to guide or monitor treatment for MRSA infections. Performed at [facility], [place] [address], [place], [place] [address]   Culture, blood (routine x 2)     Status: None (Preliminary result)   Collection Time: 02/10/2020  5:03 PM   Specimen: BLOOD  Result Value Ref Range Status   Specimen Description BLOOD RIGHT ANTECUBITAL  Final   Special Requests   Final    BOTTLES DRAWN AEROBIC AND ANAEROBIC Blood Culture adequate volume   Culture   Final    NO GROWTH 4 DAYS Performed at [facility], [place] [address] [place], [place] [address]     Report Status PENDING  Incomplete  Culture, blood (routine x 2)     Status: None (Preliminary result)   Collection Time: 02/13/2020  5:08 PM   Specimen: BLOOD LEFT HAND  Result Value Ref Range Status   Specimen Description BLOOD LEFT HAND  Final   Special Requests   Final    BOTTLES DRAWN AEROBIC ONLY Blood Culture results may not be optimal due to an inadequate volume of blood received in culture bottles   Culture   Final    NO GROWTH 4 DAYS Performed at [facility], [place] [address], [place], [place] [address]    Report Status PENDING  Incomplete  Respiratory Panel by RT PCR (Flu A&B, Covid) - Nasopharyngeal Swab     Status: Abnormal   Collection Time: 02/02/2020  6:22 PM   Specimen: Nasopharyngeal Swab  Result Value Ref Range Status   SARS Coronavirus 2 by RT PCR POSITIVE (A) NEGATIVE Final    Comment: RESULT CALLED TO, READ BACK BY AND VERIFIED WITH: Boyd 02/14/2020 A BROWNING (NOTE) SARS-CoV-2 target nucleic acids are DETECTED. SARS-CoV-2 RNA is generally detectable in upper respiratory specimens  during the acute phase of infection. Positive results are indicative of the presence of the identified virus, but do not rule out bacterial infection or co-infection with other pathogens not detected by the test. Clinical correlation with patient history and other diagnostic information is necessary to determine patient infection status. The expected result is Negative. Fact Sheet for Patients:  PinkCheek.be Fact Sheet for Healthcare Providers: GravelBags.it This test is not yet approved or cleared by the [place] FDA and  has been authorized for detection and/or diagnosis of SARS-CoV-2 by FDA under an Emergency Use Authorization (EUA).  This EUA will remain in effect (meaning this test can be used) f or the duration of  the COVID-19 declaration under Section 564(b)(1) of the Act, 21 U.S.C. section  360bbb-3(b)(1), unless the authorization is terminated or revoked sooner.    Influenza A by PCR NEGATIVE NEGATIVE Final  Influenza B by PCR NEGATIVE NEGATIVE Final    Comment: (NOTE) The Xpert Xpress SARS-CoV-2/FLU/RSV assay is intended as an aid in  the diagnosis of influenza from Nasopharyngeal swab specimens and  should not be used as a sole basis for treatment. Nasal washings and  aspirates are unacceptable for Xpert Xpress SARS-CoV-2/FLU/RSV  testing. Fact Sheet for Patients: PinkCheek.be Fact Sheet for Healthcare Providers: GravelBags.it This test is not yet approved or cleared by the [place] FDA and  has been authorized for detection and/or diagnosis of SARS-CoV-2 by  FDA under an Emergency Use Authorization (EUA). This EUA will remain  in effect (meaning this test can be used) for the duration of the  Covid-19 declaration under Section 564(b)(1) of the Act, 21  U.S.C. section 360bbb-3(b)(1), unless the authorization is  terminated or revoked. Performed at [facility], [place] [address], [place], [place] [address]   Urine culture     Status: Abnormal   Collection Time: 01/28/2020  8:06 PM   Specimen: In/Out Cath Urine  Result Value Ref Range Status   Specimen Description IN/OUT CATH URINE  Final   Special Requests NONE  Final   Culture (A)  Final    >=100,000 COLONIES/mL KLEBSIELLA PNEUMONIAE 80,000 COLONIES/mL STREPTOCOCCUS AGALACTIAE TESTING AGAINST S. AGALACTIAE NOT ROUTINELY PERFORMED DUE TO PREDICTABILITY OF AMP/PEN/VAN SUSCEPTIBILITY. Performed at [facility], [place] [address], [place],  [address]    Report Status 02/01/2020 FINAL  Final   Organism ID, Bacteria KLEBSIELLA PNEUMONIAE (A)  Final      Susceptibility   Klebsiella pneumoniae - MIC*    AMPICILLIN >=32 RESISTANT Resistant     CEFAZOLIN <=4 SENSITIVE Sensitive     CEFTRIAXONE 0.5 SENSITIVE Sensitive     CIPROFLOXACIN <=0.25  SENSITIVE Sensitive     GENTAMICIN <=1 SENSITIVE Sensitive     IMIPENEM <=0.25 SENSITIVE Sensitive     NITROFURANTOIN 32 SENSITIVE Sensitive     TRIMETH/SULFA <=20 SENSITIVE Sensitive     AMPICILLIN/SULBACTAM 8 SENSITIVE Sensitive     PIP/TAZO 8 SENSITIVE Sensitive     * >=100,000 COLONIES/mL KLEBSIELLA PNEUMONIAE    Radiology Reports CT Head Wo Contrast  Result Date: 02/01/2020 CLINICAL DATA:  Altered mental status EXAM: CT HEAD WITHOUT CONTRAST TECHNIQUE: Contiguous axial images were obtained from the base of the skull through the vertex without intravenous contrast. COMPARISON:  12/23/2008 FINDINGS: Brain: No evidence of acute infarction, hemorrhage, hydrocephalus, extra-axial collection or mass lesion/mass effect. There is a right frontal approach VP shunt with tip terminating in the right lateral ventricle. There is encephalomalacia along the course of the VP shunt which has slightly worsened since the prior study. There is atrophy and chronic microvascular ischemic changes. There is a persistent 1 cm density at the left frontal horn periventricular white matter, stable from prior study. Vascular: No hyperdense vessel or unexpected calcification. Skull: Normal. Negative for fracture or focal lesion. Sinuses/Orbits: There is mucosal thickening of the maxillary sinuses and ethmoid air cells bilaterally. The remaining paranasal sinuses and mastoid air cells are essentially clear. Other: None. IMPRESSION: 1. No acute intracranial abnormality. 2. Chronic microvascular ischemic changes and atrophy which has progressed since prior study. 3. Well-positioned VP shunt. Electronically Signed   By: Constance Holster M.D.   On: 02/13/2020 19:32   DG Chest Portable 1 View  Result Date: 01/28/2020 CLINICAL DATA:  Altered mental status EXAM: PORTABLE CHEST 1 VIEW COMPARISON:  09/08/2014 FINDINGS: VP shunt traversing the right chest, continuous where seen. Prominent markings  at the bases. No effusion, edema, or  pneumothorax. Normal heart size and mediastinal contours. IMPRESSION: Indistinct opacity at the lung bases could be atelectasis (lung volumes are low) or infectious infiltrates. Electronically Signed   By: Monte Fantasia M.D.   On: 02/07/2020 17:47   CT RENAL STONE STUDY  Result Date: 01/30/2020 CLINICAL DATA:  Pyelonephritis. EXAM: CT ABDOMEN AND PELVIS WITHOUT CONTRAST TECHNIQUE: Multidetector CT imaging of the abdomen and pelvis was performed following the standard protocol without IV contrast. COMPARISON:  April 06, 2014. FINDINGS: Lower chest: Multiple airspace opacities are noted in the visualized lung bases concerning for multifocal pneumonia. Hepatobiliary: No focal liver abnormality is seen. No gallstones, gallbladder wall thickening, or biliary dilatation. Pancreas: Unremarkable. No pancreatic ductal dilatation or surrounding inflammatory changes. Spleen: Normal in size without focal abnormality. Adrenals/Urinary Tract: Adrenal glands are unremarkable. Kidneys are normal, without renal calculi, focal lesion, or hydronephrosis. Bladder is unremarkable. Stomach/Bowel: Stomach is within normal limits. Appendix appears normal. No evidence of bowel wall thickening, distention, or inflammatory changes. Vascular/Lymphatic: Aortic atherosclerosis. No enlarged abdominal or pelvic lymph nodes. Reproductive: Prostate is unremarkable. Other: No abdominal wall hernia or abnormality. No abdominopelvic ascites. There is again noted right-sided ventriculoperitoneal shunt with distal tip in the right lower quadrant. Musculoskeletal: No acute or significant osseous findings. IMPRESSION: 1. Multiple airspace opacities are noted in the visualized lung bases concerning for multifocal pneumonia. 2. Aortic atherosclerosis. 3. No acute abnormality seen in the abdomen or pelvis. Aortic Atherosclerosis (ICD10-I70.0). Electronically Signed   By: Marijo Conception M.D.   On: 01/30/2020 10:48   ECHOCARDIOGRAM LIMITED  Result  Date: 01/31/2020   ECHOCARDIOGRAM REPORT   Patient Name:   HENDRIX RUDENKO Date of Exam: 01/31/2020 Medical Rec #:  [id number]      Height:       70.0 in Accession #:    [id number]     Weight:       159.8 lb Date of Birth:  05/22/1941      BSA:          1.90 m Patient Age:    89 years       BP:           147/68 mmHg Patient Gender: M              HR:           95 bpm. Exam Location:  Inpatient Procedure: Limited Echo Indications:    Fever 780.6/R50.9  History:        Patient has no prior history of Echocardiogram examinations.                 Risk Factors:Hypertension, Dyslipidemia and Diabetes.  Sonographer:    Clayton Lefort RDCS (AE) Referring Phys: [id number] [place]  1. Left ventricular ejection fraction, by visual estimation, is 60 to 65%. The left ventricle has normal function. There is severely increased left ventricular hypertrophy of the basal septum.  2. Left ventricular diastolic parameters are consistent with Grade I diastolic dysfunction (impaired relaxation).  3. Elevated left ventricular end-diastolic pressure.  4. Global right ventricle has normal systolic function.The right ventricular size is normal. No increase in right ventricular wall thickness.  5. Left atrial size was normal.  6. Right atrial size was normal.  7. Mild to moderate mitral annular calcification.  8. The mitral valve is normal in structure. No evidence of mitral valve regurgitation. No evidence of mitral stenosis.  9. The tricuspid valve is normal  in structure. Tricuspid valve regurgitation is not demonstrated. 10. The aortic valve is tricuspid. Aortic valve regurgitation is not visualized. Mild aortic valve sclerosis without stenosis. 11. The pulmonic valve was normal in structure. Pulmonic valve regurgitation is not visualized. 12. The inferior vena cava is normal in size with greater than 50% respiratory variability, suggesting right atrial pressure of 3 mmHg. 13. TR signal is inadequate for assessing pulmonary artery  systolic pressure. 14. Poor acoustical windows limit ability to adequately assess for vegetation. No obvious massess on valve. Recommend TEE if clinically indicated. FINDINGS  Left Ventricle: Left ventricular ejection fraction, by visual estimation, is 60 to 65%. The left ventricle has normal function. The left ventricle is not well visualized. There is severely increased left ventricular hypertrophy of the basal septum. Left  ventricular diastolic parameters are consistent with Grade I diastolic dysfunction (impaired relaxation). Elevated left ventricular end-diastolic pressure. Right Ventricle: The right ventricular size is normal. No increase in right ventricular wall thickness. Global RV systolic function is has normal systolic function. Left Atrium: Left atrial size was normal in size. Right Atrium: Right atrial size was normal in size Pericardium: There is no evidence of pericardial effusion. Mitral Valve: The mitral valve is normal in structure. Mild to moderate mitral annular calcification. No evidence of mitral valve regurgitation. No evidence of mitral valve stenosis by observation. Tricuspid Valve: The tricuspid valve is normal in structure. Tricuspid valve regurgitation is not demonstrated. Aortic Valve: The aortic valve is tricuspid. Aortic valve regurgitation is not visualized. Mild aortic valve sclerosis is present, with no evidence of aortic valve stenosis. Pulmonic Valve: The pulmonic valve was normal in structure. Pulmonic valve regurgitation is not visualized. Pulmonic regurgitation is not visualized. Aorta: The aortic root, ascending aorta and aortic arch are all structurally normal, with no evidence of dilitation or obstruction. Venous: The inferior vena cava is normal in size with greater than 50% respiratory variability, suggesting right atrial pressure of 3 mmHg. IAS/Shunts: No atrial level shunt detected by color flow Doppler. There is no evidence of a patent foramen ovale. No ventricular  septal defect is seen or detected. There is no evidence of an atrial septal defect.  LEFT VENTRICLE PLAX 2D LVIDd:         3.70 cm  Diastology LVIDs:         2.30 cm  LV e' lateral:   5.33 cm/s LV PW:         1.30 cm  LV E/e' lateral: 14.5 LV IVS:        1.60 cm  LV e' medial:    6.09 cm/s LVOT diam:     2.00 cm  LV E/e' medial:  12.7 LV SV:         40 ml LV SV Index:   21.12 LVOT Area:     3.14 cm  LEFT ATRIUM         Index LA diam:    2.70 cm 1.42 cm/m   AORTA Ao Root diam: 2.80 cm MITRAL VALVE MV Area (PHT): 7.99 cm              SHUNTS MV PHT:        27.55 msec            Systemic Diam: 2.00 cm MV Decel Time: 95 msec MV E velocity: 77.10 cm/s  103 cm/s MV A velocity: 118.00 cm/s 70.3 cm/s MV E/A ratio:  0.65        1.5  Fransico Him MD Electronically  signed by Fransico Him MD Signature Date/Time: 01/31/2020/11:57:58 AM    Final    DG ESOPHAGUS W SINGLE CM (SOL OR THIN BA)  Result Date: 02/01/2020 CLINICAL DATA:  Mid chest pain and heartburn. Recent bedside speech pathology evaluation. COVID-19 infection. EXAM: SINGLE COLUMN BARIUM ENEMA TECHNIQUE: Initial scout AP supine abdominal image obtained to insure adequate colon cleansing. Barium was introduced into the colon in a retrograde fashion and refluxed from the rectum to the cecum. Spot images of the colon followed by overhead radiographs were obtained. FLUOROSCOPY TIME:  Fluoroscopy Time: 1 minutes and 0 seconds of low-dose pulsed fluoroscopy Radiation Exposure Index (if provided by the fluoroscopic device): 8.7 mGy Number of Acquired Spot Images: 0 COMPARISON:  Chest radiographs 02/06/2020. Abdominal CT 01/30/2020. FINDINGS: The study was performed in the supine and right lateral decubitus positions. The esophageal motility appears normal. No laryngeal penetration or abnormality of the cervical esophagus was demonstrated. There is mild smooth narrowing of the distal esophageal lumen without mucosal ulceration. A 13 mm barium tablet was administered and was  temporarily delayed in the cervical esophagus. This subsequently passed into the distal esophagus. Despite drinking water and thin barium, this tablet did not pass into the stomach during approximately 3 minutes of intermittent fluoroscopic observation with the patient supine and semi erect. IMPRESSION: 1. Mild smooth narrowing of the distal esophageal lumen, likely due to chronic reflux. No evidence of mucosal ulceration. 2. Barium tablet did not pass into the stomach during approximately 3 minutes of intermittent observation. Electronically Signed   By: Richardean Sale M.D.   On: 02/01/2020 14:25

## 2020-02-03 LAB — COMPREHENSIVE METABOLIC PANEL
ALT: 27 U/L (ref 0–44)
AST: 38 U/L (ref 15–41)
Albumin: 2 g/dL — ABNORMAL LOW (ref 3.5–5.0)
Alkaline Phosphatase: 60 U/L (ref 38–126)
Anion gap: 10 (ref 5–15)
BUN: 57 mg/dL — ABNORMAL HIGH (ref 8–23)
CO2: 22 mmol/L (ref 22–32)
Calcium: 8.2 mg/dL — ABNORMAL LOW (ref 8.9–10.3)
Chloride: 107 mmol/L (ref 98–111)
Creatinine, Ser: 1.99 mg/dL — ABNORMAL HIGH (ref 0.61–1.24)
GFR calc Af Amer: 36 mL/min — ABNORMAL LOW (ref >60)
GFR calc non Af Amer: 31 mL/min — ABNORMAL LOW (ref >60)
Glucose, Bld: 89 mg/dL (ref 70–99)
Potassium: 4.2 mmol/L (ref 3.5–5.1)
Sodium: 139 mmol/L (ref 135–145)
Total Bilirubin: 0.4 mg/dL (ref 0.3–1.2)
Total Protein: 6.4 g/dL — ABNORMAL LOW (ref 6.5–8.1)

## 2020-02-03 LAB — CBC
HCT: 34.5 % — ABNORMAL LOW (ref 39.0–52.0)
Hemoglobin: 11.4 g/dL — ABNORMAL LOW (ref 13.0–17.0)
MCH: 27.7 pg (ref 26.0–34.0)
MCHC: 33 g/dL (ref 30.0–36.0)
MCV: 83.9 fL (ref 80.0–100.0)
Platelets: 364 10*3/uL (ref 150–400)
RBC: 4.11 MIL/uL — ABNORMAL LOW (ref 4.22–5.81)
RDW: 14.4 % (ref 11.5–15.5)
WBC: 11.3 10*3/uL — ABNORMAL HIGH (ref 4.0–10.5)
nRBC: 0 % (ref 0.0–0.2)

## 2020-02-03 LAB — GLUCOSE, CAPILLARY
Glucose-Capillary: 81 mg/dL (ref 70–99)
Glucose-Capillary: 163 mg/dL — ABNORMAL HIGH (ref 70–99)
Glucose-Capillary: 125 mg/dL — ABNORMAL HIGH (ref 70–99)
Glucose-Capillary: 113 mg/dL — ABNORMAL HIGH (ref 70–99)

## 2020-02-03 LAB — CULTURE, BLOOD (ROUTINE X 2)
Culture: NO GROWTH
Culture: NO GROWTH
Special Requests: ADEQUATE

## 2020-02-03 LAB — D-DIMER, QUANTITATIVE: D-Dimer, Quant: 1.59 ug/mL-FEU — ABNORMAL HIGH (ref 0.00–0.50)

## 2020-02-03 LAB — C-REACTIVE PROTEIN: CRP: 9.2 mg/dL — ABNORMAL HIGH (ref <1.0)

## 2020-02-03 LAB — CK: Total CK: 183 U/L (ref 49–397)

## 2020-02-03 LAB — FERRITIN: Ferritin: 441 ng/mL — ABNORMAL HIGH (ref 24–336)

## 2020-02-03 MED ORDER — BENZONATATE 100 MG PO CAPS
200.0000 mg | ORAL_CAPSULE | Freq: Three times a day (TID) | ORAL | Status: DC | PRN
  Administered 2020-02-03 – 2020-02-11 (×3): 200 mg via ORAL
  Filled 2020-02-03 (×3): qty 2

## 2020-02-03 MED ORDER — SUCRALFATE 1 GM/10ML PO SUSP
1.0000 g | Freq: Three times a day (TID) | ORAL | Status: DC
  Administered 2020-02-03 – 2020-02-13 (×36): 1 g via ORAL
  Filled 2020-02-03 (×36): qty 10

## 2020-02-03 MED ORDER — PANTOPRAZOLE SODIUM 40 MG IV SOLR
40.0000 mg | Freq: Two times a day (BID) | INTRAVENOUS | Status: DC
  Administered 2020-02-03 – 2020-02-06 (×7): 40 mg via INTRAVENOUS
  Filled 2020-02-03 (×7): qty 40

## 2020-02-03 MED ORDER — INSULIN GLARGINE 100 UNIT/ML ~~LOC~~ SOLN
15.0000 [IU] | Freq: Two times a day (BID) | SUBCUTANEOUS | Status: DC
  Administered 2020-02-03 – 2020-02-04 (×2): 15 [IU] via SUBCUTANEOUS
  Filled 2020-02-03 (×4): qty 0.15

## 2020-02-03 MED ORDER — HEPARIN SODIUM (PORCINE) 5000 UNIT/ML IJ SOLN
5000.0000 [IU] | Freq: Three times a day (TID) | INTRAMUSCULAR | Status: DC
  Administered 2020-02-03 – 2020-02-06 (×8): 5000 [IU] via SUBCUTANEOUS
  Filled 2020-02-03 (×8): qty 1

## 2020-02-03 NOTE — TOC Progression Note (Signed)
Transition of Care [facility]) - Progression Note    Patient Details  Name: Bob Vazquez MRN: [id number] Date of Birth: 20-May-1941  Transition of Care [facility]) CM/SW [place], [place] Work Phone Number: 02/03/2020, 9:35 AM  Clinical Narrative:    MSW spoke with pt's sister Bob Vazquez [phone]) regarding SNF placement since pt is disoriented at this time (X2). MSW stated that [facility], [place], [facility] skilled nursing facilities have accepted him at this time. [place] stated that his sister resides at [facility], so this facility may be the best option for him, since he will be close to his sister. Pt's sister requested [facility] at this time. MSW answered all questions she had regarding pt's discharge planning (PTAR and clothing for pt). MSW and CSW will continue to follow with DC plans.    Expected Discharge Plan: [place] Barriers to Discharge: Continued Medical Work up  Expected Discharge Plan and Services Expected Discharge Plan: [place] arrangements for the past 2 months: Single Family Home                                       Social Determinants of Health (SDOH) Interventions    Readmission Risk Interventions No flowsheet data found.

## 2020-02-03 NOTE — Progress Notes (Addendum)
  Speech Language Pathology Treatment: Dysphagia  Patient Details Name: Bob Vazquez MRN: [id number] DOB: 17-May-1941 Today's Date: 02/03/2020 Time: 0952-1007 SLP Time Calculation (min) (ACUTE ONLY): 15 min  Assessment / Plan / Recommendation Clinical Impression  Pt seen laying almost flat with barely touched breakfast tray at 10 am. Says he tried to eat, but still feeling a lot of pain, like heartburn. Pt repositioned and educated about the importance of sitting upright or being in the chair for meals. Pt took one bite of a soft pancake, which he masticated adequately despite poor dentition. Immediate grimacing with swallow. Needed max encouragement to follow a bite with a sip. Says he will try to eat more and take it very slow through the day. He felt softer food may help, will downgrade to dys 2. Posted signs about assisting pt to chair for meals or at least sitting upright. Will follow for further needs.   HPI HPI: Bob Vazquez is a 79 year old male with a history of type 2 diabetes, hypertension, hyperlipidemia, NPH who lives alone and family had not heard from him in several days.  Law enforcement did a wellness check and found patient in an empty bathtub.  No signs of trauma.  Patient has been awake though confused and was found to have UTI, likely sepsis and DKA and also Covid-19 positive.      SLP Plan  Continue with current plan of care       Recommendations  Diet recommendations: Dysphagia 2 (fine chop);Thin liquid Liquids provided via: Cup;Straw Medication Administration: Crushed with puree Supervision: Patient able to self feed Compensations: Slow rate;Small sips/bites;Follow solids with liquid Postural Changes and/or Swallow Maneuvers: Seated upright 90 degrees;Upright 30-60 min after meal;Out of bed for meals                Oral Care Recommendations: Oral care BID Follow up Recommendations: 24 hour supervision/assistance;Skilled Nursing facility SLP Visit Diagnosis:  Dysphagia, unspecified (R13.10) Plan: Continue with current plan of care       GO               Bob [place], MA [place] Pager [phone] Office [phone]  Bob Vazquez 02/03/2020, 10:14 AM

## 2020-02-03 NOTE — Progress Notes (Signed)
Physical Therapy Treatment Patient Details Name: Bob Vazquez MRN: [id number] DOB: [birth year] Today's Date: 02/03/2020    History of Present Illness 79 year old male with a history of type 2 diabetes, hypertension, hyperlipidemia, NPH (with VP shunt) who lives alone and family had not heard from him in several days.  Law enforcement did a wellness check 02/21/2020 and found patient in an empty bathtub.  No signs of trauma.  Patient has been awake though confused and was found to have UTI, likely sepsis and DKA and also Covid-19 positive. The patient is apparently cared for by his nephew who is hospitalized at [facility] with Covid-19.    PT Comments    Patient cooperative and appreciative. He again was dizzy with orthostasis during standing and transfer to recliner. He recovered more quickly (and even with feet on the floor, instead of elevated on recliner). Educated in exercises throughout session (attempting to "boost" his BP before/after changes in position). Notified RN and Dr. Sloan Leiter that pt continues with orthostasis (see vitals flowsheet).    Follow Up Recommendations  SNF     Equipment Recommendations  Other (comment)(TBD at next venue)    Recommendations for Other Services       Precautions / Restrictions Precautions Precautions: Fall;Other (comment) Precaution Comments: orthostasis; Airborne and Contact  Restrictions Weight Bearing Restrictions: No    Mobility  Bed Mobility Overal bed mobility: Needs Assistance Bed Mobility: Supine to Sit     Supine to sit: Min guard;HOB elevated     General bed mobility comments: nearly needed assist to raise torso; incr time and effort to scoot to EOB  Transfers Overall transfer level: Needs assistance Equipment used: 2 person hand held assist Transfers: Sit to/from [employer] Transfers Sit to Stand: Min assist;+2 physical assistance;+2 safety/equipment         General transfer comment: stood for BP assessment and too  dizzy for BP to finish before turned to sit in chair (see vitals flowsheet)  Ambulation/Gait Ambulation/Gait assistance: Min assist;+2 physical assistance;+2 safety/equipment   Assistive device: 2 person hand held assist Gait Pattern/deviations: Step-to pattern;Decreased stride length;Shuffle;Leaning posteriorly     General Gait Details: posterior lean upon standing with some improvement as walking to chair   Stairs             Wheelchair Mobility    Modified Rankin (Stroke Patients Only)       Balance Overall balance assessment: Needs assistance Sitting-balance support: Feet supported Sitting balance-Leahy Scale: Fair   Postural control: Posterior lean Standing balance support: Bilateral upper extremity supported Standing balance-Leahy Scale: Poor Standing balance comment: leaning backwards with posterior calves supporting him against bed frame                            Cognition Arousal/Alertness: Awake/alert Behavior During Therapy: WFL for tasks assessed/performed Overall Cognitive Status: No family/caregiver present to determine baseline cognitive functioning                                 General Comments: follows simple commands (some repetition required ?HOH)      Exercises Other Exercises Other Exercises: Supine: ankle pumps, hand pumps, "punches" Other Exercises: seated: ankle pumps, knee extension, marching, hip abduction 5-10 reps each with cues for slower, controlled movements    General Comments General comments (skin integrity, edema, etc.): again notified RN (who was to follow up with MD 2/9);  notified MD of continued orthostasis      Pertinent Vitals/Pain Pain Assessment: 0-10 Pain Score: 8  Pain Location: back, when he swallows Pain Descriptors / Indicators: Grimacing Pain Intervention(s): Limited activity within patient's tolerance;Monitored during session;Repositioned    Home Living                       Prior Function            PT Goals (current goals can now be found in the care plan section) Acute Rehab PT Goals Patient Stated Goal: get stronger Time For Goal Achievement: March 06, 2020 Potential to Achieve Goals: Good Progress towards PT goals: Not progressing toward goals - comment(continued orthostasis)    Frequency    Min 2X/week      PT Plan Current plan remains appropriate    Co-evaluation              AM-PAC PT "6 Clicks" Mobility   Outcome Measure  Help needed turning from your back to your side while in a flat bed without using bedrails?: A Little Help needed moving from lying on your back to sitting on the side of a flat bed without using bedrails?: A Little Help needed moving to and from a bed to a chair (including a wheelchair)?: A Lot Help needed standing up from a chair using your arms (e.g., wheelchair or bedside chair)?: A Lot Help needed to walk in hospital room?: Total Help needed climbing 3-5 steps with a railing? : Total 6 Click Score: 12    End of Session Equipment Utilized During Treatment: Gait belt Activity Tolerance: Treatment limited secondary to medical complications (Comment) Patient left: in chair;with call bell/phone within reach;with chair alarm set Nurse Communication: Mobility status;Other (comment)(orthostasis with standing; condom cath off on arrival) PT Visit Diagnosis: Unsteadiness on feet (R26.81);Muscle weakness (generalized) (M62.81)     Time: [id number] PT Time Calculation (min) (ACUTE ONLY): 38 min  Charges:  $Gait Training: 8-22 mins $Therapeutic Exercise: 23-37 mins                      [name], PT Pager [phone]    Rexanne Mano 02/03/2020, 12:04 PM

## 2020-02-03 NOTE — Progress Notes (Signed)
PROGRESS NOTE                                                                                                                                                                                                             Patient Demographics:    Bob Vazquez, is a 79 y.o. male, DOB - 1941-11-09, [id number]  Outpatient Primary MD for the patient is Nolene Ebbs, MD   Admit date - 02/08/2020   LOS - 5  Chief Complaint  Patient presents with  . Altered Mental Status  . Hyperglycemia       Brief Narrative: Patient is a 79 y.o. male with PMHx of DM-2, HTN, HLD, NPH-who was found very confused in an empty bathtub on a wellness check by law enforcement (family had not heard from him for several days)-he was subsequently brought to the ED-and was found to have acute metabolic encephalopathy in the setting of severe sepsis due to UTI, DKA and COVID-19 pneumonia.  He was admitted to the [facility] clinical stability transferred to the [facility] service on 2/9.   Subjective:   Lying in bed-continues to have odynophagia.  Nuys any shortness of breath.    Assessment  & Plan :   Covid 19 Viral pneumonia: On just 1 L this morning-much improved-CRP trending down-completed a course of remdesivir-we will slowly titrate steroids over the next few days.  Fever: afebrile  O2 requirements:  SpO2: (!) 85 % O2 Flow Rate (L/min): 1 L/min   COVID-19 Labs: Recent Labs    02/02/20 0437 02/03/20 0431  DDIMER 2.09* 1.59*  FERRITIN  --  441*  LDH 325*  --   CRP 12.7* 9.2*    No results found for: BNP  No results for input(s): PROCALCITON in the last 168 hours.  Lab Results  Component Value Date   SARSCOV2NAA POSITIVE (A) 02/20/2020   [place] Not Detected 07/13/2019     COVID-19 Medications: Steroids: 2/5>> 2/9 Remdesivir: 2/5>> 2/9  Prone/Incentive Spirometry: encouraged incentive spirometry use 3-4/hour.  DVT  Prophylaxis  : Heparin at intermediate dosing.  Severe sepsis secondary to UTI: Sepsis physiology has resolved-cultures positive for Klebsiella pneumonia and Streptococcus agalactiae-blood cultures negative.  Continue cefepime for a few more days.  Antibiotics: Ceftriaxone: 2/9>> Cefepime: 2/5>> 2/8  Acute metabolic encephalopathy: Secondary to DKA/AKI-improved-suspect patient not far from usual baseline.  AKI on CKD stage IIIb: AKI likely hemodynamically mediated-improving-still not close to baseline.  Continue supportive care.  Odynophagia: Continues to have odynophagia-no obvious etiology apparent-no thrush seen in oral cavity-continue Diflucan-add PPI/Carafate.  If no improvement over the next few days-may need GI evaluation.  Barium esophagogram on 2/8-showed some mild smooth narrowing in the distal esophagus.  DKA: Resolved.  Unfortunately patient ran out of his insulin-and could not get it refilled-likely provoking behavior along with Covid/UTI.  Insulin-dependent DM-2: CBGs stable-patient's oral intake is poor-hence decrease Lantus to 15 units twice daily.  Continue SSI.  Follow.   HTN: Controlled with amlodipine  HLD: Statins on hold-resume in the next few days.  Deconditioning/debility: Secondary to acute illness-PT/OT following-recommendations of SNF-social work consulted.  Consults  :  PCCM  Procedures  :  None  ABG:    Component Value Date/Time   HCO3 20.6 02/20/2020 1900   TCO2 22 02/02/2020 1900   ACIDBASEDEF 7.0 (H) 01/27/2020 1900   O2SAT 92.0 02/19/2020 1900    Vent Settings: N/A  Condition - Guarded  Family Communication  : Called brother-no answer-unable to leave voicemail on 2/9 and 2/10  Code Status :  Full Code  Diet :  Diet Order            DIET DYS 2 Room service appropriate? Yes; Fluid consistency: Thin  Diet effective now               Disposition Plan  :  Remain hospitalized-likely SNF on discharge over the next 2-3 days depending on  clinical course.  Barriers to discharge: Hypoxia requiring O2 supplementation/remains on IV antibiotics/ongoing odynophagia  Antimicorbials  :    Anti-infectives (From admission, onward)   Start     Dose/Rate Route Frequency Ordered Stop   02/02/20 1400  fluconazole (DIFLUCAN) IVPB 100 mg     100 mg 50 mL/hr over 60 Minutes Intravenous Every 24 hours 02/02/20 1341 02/09/20 1359   02/02/20 1330  cefTRIAXone (ROCEPHIN) 1 g in sodium chloride 0.9 % 100 mL IVPB     1 g 200 mL/hr over 30 Minutes Intravenous Every 24 hours 02/02/20 1328 02/04/2020 1329   01/30/20 1830  ceFEPIme (MAXIPIME) 2 g in sodium chloride 0.9 % 100 mL IVPB  Status:  Discontinued     2 g 200 mL/hr over 30 Minutes Intravenous Every 24 hours 02/19/2020 1927 02/02/20 1328   01/30/20 1000  remdesivir 100 mg in sodium chloride 0.9 % 100 mL IVPB     100 mg 200 mL/hr over 30 Minutes Intravenous Daily 02/06/2020 2258 02/02/20 1000   01/30/2020 2300  remdesivir 200 mg in sodium chloride 0.9% 250 mL IVPB     200 mg 580 mL/hr over 30 Minutes Intravenous Once 02/11/2020 2258 01/30/20 0102   01/25/2020 1929  vancomycin variable dose per unstable renal function (pharmacist dosing)  Status:  Discontinued      Does not apply See admin instructions 02/01/2020 1929 01/30/20 1429   02/04/2020 1730  ceFEPIme (MAXIPIME) 2 g in sodium chloride 0.9 % 100 mL IVPB     2 g 200 mL/hr over 30 Minutes Intravenous  Once 02/14/2020 1716 02/18/2020 1935   02/14/2020 1730  metroNIDAZOLE (FLAGYL) IVPB 500 mg     500 mg 100 mL/hr over 60 Minutes Intravenous  Once 02/03/2020 1716 02/06/2020 2000   02/13/2020 1730  vancomycin (VANCOCIN) IVPB 1000 mg/200 mL premix     1,000 mg 200 mL/hr over 60 Minutes Intravenous  Once 01/26/2020 1716 02/05/2020 2005  Inpatient Medications  Scheduled Meds: . amLODipine  5 mg Oral Daily  . aspirin EC  81 mg Oral QHS  . dexamethasone (DECADRON) injection  6 mg Intravenous Q24H  . heparin  7,500 Units Subcutaneous Q8H  . insulin aspart   0-15 Units Subcutaneous TID WC  . insulin aspart  0-5 Units Subcutaneous QHS  . insulin glargine  25 Units Subcutaneous BID  . pantoprazole (PROTONIX) IV  40 mg Intravenous Q12H  . sucralfate  1 g Oral TID WC & HS   Continuous Infusions: . cefTRIAXone (ROCEPHIN)  IV 1 g (02/03/20 1219)  . fluconazole (DIFLUCAN) IV 100 mg (02/03/20 1258)   PRN Meds:.   Time Spent in minutes  25  See all Orders from today for further details   Oren Binet M.D on 02/03/2020 at 2:52 PM  To page go to [email or web address] - use universal password  [facility] -  Office  [phone]    Objective:   Vitals:   02/02/20 2014 02/03/20 0309 02/03/20 0520 02/03/20 1256  BP: 118/73  133/61 (!) 121/55  Pulse: 88 74 79 96  Resp: 18  20   Temp: 97.8 F (36.6 C)  98.2 F (36.8 C) 97.7 F (36.5 C)  TempSrc: Oral  Oral Oral  SpO2: (!) 85% 98% 92% (!) 85%  Weight:  72.8 kg    Height:        Wt Readings from Last 3 Encounters:  02/03/20 72.8 kg  04/12/14 81.6 kg  11/12/13 81.6 kg     Intake/Output Summary (Last 24 hours) at 02/03/2020 1452 Last data filed at 02/03/2020 0529 Gross per 24 hour  Intake 50 ml  Output 1350 ml  Net -1300 ml     Physical Exam Gen Exam:Alert awake-not in any distress HEENT:atraumatic, normocephalic Chest: B/L clear to auscultation anteriorly CVS:S1S2 regular Abdomen:soft non tender, non distended Extremities:no edema Neurology: Non focal Skin: no rash  Data Review:    CBC Recent Labs  Lab 02/06/2020 1800 02/05/2020 1900 02/04/2020 2300 02/01/20 0410 02/03/20 0431  WBC 12.7*  --  10.4 10.3 11.3*  HGB 14.1 15.0 11.4* 11.9* 11.4*  HCT 45.0 44.0 34.1* 36.1* 34.5*  PLT 369  --  271 348 364  MCV 88.1  --  83.2 85.5 83.9  MCH 27.6  --  27.8 28.2 27.7  MCHC 31.3  --  33.4 33.0 33.0  RDW 14.0  --  13.7 14.6 14.4  LYMPHSABS 0.4*  --   --   --   --   MONOABS 0.6  --   --   --   --   EOSABS 0.0  --   --   --   --   BASOSABS 0.0  --   --   --   --      Chemistries  Recent Labs  Lab 02/01/2020 2300 01/30/20 0149 01/30/20 1132 01/31/20 0720 01/31/20 0857 02/01/20 0410 02/02/20 0437 02/03/20 0431  NA 140   < > 144 144  --  143 141 139  K 4.7   < > 5.5* 5.5*  --  5.4* 4.6 4.2  CL 107   < > 107 111  --  109 106 107  CO2 20*   < > 24 21*  --  24 23 22   GLUCOSE 438*   < > 189* 182*  --  215* 90 89  BUN 66*   < > 63* 67*  --  57* 55* 57*  CREATININE 3.17*   < >  2.97* 2.59*  --  2.21* 1.99* 1.99*  CALCIUM 7.9*   < > 8.7* 8.7*  --  8.5* 8.3* 8.2*  MG 2.5*  --   --   --   --   --   --   --   AST  --   --  52*  --  46* 40 34 38  ALT  --   --  27  --  28 26 26 27   ALKPHOS  --   --  62  --  65 58 56 60  BILITOT  --   --  0.5  --  0.8 0.6 0.7 0.4   < > = values in this interval not displayed.   ------------------------------------------------------------------------------------------------------------------ No results for input(s): CHOL, HDL, LDLCALC, TRIG, CHOLHDL, LDLDIRECT in the last 72 hours.  Lab Results  Component Value Date   HGBA1C 9.4 (H) 07/31/2012   ------------------------------------------------------------------------------------------------------------------ No results for input(s): TSH, T4TOTAL, T3FREE, THYROIDAB in the last 72 hours.  Invalid input(s): FREET3 ------------------------------------------------------------------------------------------------------------------ Recent Labs    02/03/20 0431  FERRITIN 441*    Coagulation profile Recent Labs  Lab 01/27/2020 1800  INR 1.2    Recent Labs    02/02/20 0437 02/03/20 0431  DDIMER 2.09* 1.59*    Cardiac Enzymes No results for input(s): CKMB, TROPONINI, MYOGLOBIN in the last 168 hours.  Invalid input(s): CK ------------------------------------------------------------------------------------------------------------------ No results found for: BNP  Micro Results Recent Results (from the past 240 hour(s))  MRSA PCR Screening     Status: None    Collection Time: 02/04/2020 12:49 AM   Specimen: Nasopharyngeal  Result Value Ref Range Status   MRSA by PCR NEGATIVE NEGATIVE Final    Comment:        The GeneXpert MRSA Assay (FDA approved for NASAL specimens only), is one component of a comprehensive MRSA colonization surveillance program. It is not intended to diagnose MRSA infection nor to guide or monitor treatment for MRSA infections. Performed at [facility], [place] [address] [place], [place] [address]   Culture, blood (routine x 2)     Status: None   Collection Time: 01/25/2020  5:03 PM   Specimen: BLOOD  Result Value Ref Range Status   Specimen Description BLOOD RIGHT ANTECUBITAL  Final   Special Requests   Final    BOTTLES DRAWN AEROBIC AND ANAEROBIC Blood Culture adequate volume   Culture   Final    NO GROWTH 5 DAYS Performed at [facility], [place] [address] [place], [place] [address]    Report Status 02/03/2020 FINAL  Final  Culture, blood (routine x 2)     Status: None   Collection Time: 01/30/2020  5:08 PM   Specimen: BLOOD LEFT HAND  Result Value Ref Range Status   Specimen Description BLOOD LEFT HAND  Final   Special Requests   Final    BOTTLES DRAWN AEROBIC ONLY Blood Culture results may not be optimal due to an inadequate volume of blood received in culture bottles   Culture   Final    NO GROWTH 5 DAYS Performed at [facility], [place] [address], [place], [place] [address]    Report Status 02/03/2020 FINAL  Final  Respiratory Panel by RT PCR (Flu A&B, Covid) - Nasopharyngeal Swab     Status: Abnormal   Collection Time: 02/18/2020  6:22 PM   Specimen: Nasopharyngeal Swab  Result Value Ref Range Status   SARS Coronavirus 2 by RT PCR POSITIVE (A) NEGATIVE Final  Comment: RESULT CALLED TO, READ BACK BY AND VERIFIED WITH: Crivitz 02/08/2020 A BROWNING (NOTE) SARS-CoV-2 target nucleic acids are DETECTED. SARS-CoV-2 RNA is generally detectable in upper respiratory specimens  during the  acute phase of infection. Positive results are indicative of the presence of the identified virus, but do not rule out bacterial infection or co-infection with other pathogens not detected by the test. Clinical correlation with patient history and other diagnostic information is necessary to determine patient infection status. The expected result is Negative. Fact Sheet for Patients:  PinkCheek.be Fact Sheet for Healthcare Providers: GravelBags.it This test is not yet approved or cleared by the [place] FDA and  has been authorized for detection and/or diagnosis of SARS-CoV-2 by FDA under an Emergency Use Authorization (EUA).  This EUA will remain in effect (meaning this test can be used) f or the duration of  the COVID-19 declaration under Section 564(b)(1) of the Act, 21 U.S.C. section 360bbb-3(b)(1), unless the authorization is terminated or revoked sooner.    Influenza A by PCR NEGATIVE NEGATIVE Final   Influenza B by PCR NEGATIVE NEGATIVE Final    Comment: (NOTE) The Xpert Xpress SARS-CoV-2/FLU/RSV assay is intended as an aid in  the diagnosis of influenza from Nasopharyngeal swab specimens and  should not be used as a sole basis for treatment. Nasal washings and  aspirates are unacceptable for Xpert Xpress SARS-CoV-2/FLU/RSV  testing. Fact Sheet for Patients: PinkCheek.be Fact Sheet for Healthcare Providers: GravelBags.it This test is not yet approved or cleared by the [place] FDA and  has been authorized for detection and/or diagnosis of SARS-CoV-2 by  FDA under an Emergency Use Authorization (EUA). This EUA will remain  in effect (meaning this test can be used) for the duration of the  Covid-19 declaration under Section 564(b)(1) of the Act, 21  U.S.C. section 360bbb-3(b)(1), unless the authorization is  terminated or revoked. Performed at [facility], [place] [address] [place], [place] [address]   Urine culture     Status: Abnormal   Collection Time: 02/08/2020  8:06 PM   Specimen: In/Out Cath Urine  Result Value Ref Range Status   Specimen Description IN/OUT CATH URINE  Final   Special Requests NONE  Final   Culture (A)  Final    >=100,000 COLONIES/mL KLEBSIELLA PNEUMONIAE 80,000 COLONIES/mL STREPTOCOCCUS AGALACTIAE TESTING AGAINST S. AGALACTIAE NOT ROUTINELY PERFORMED DUE TO PREDICTABILITY OF AMP/PEN/VAN SUSCEPTIBILITY. Performed at [facility], [place] [address], [place], [place] [address]    Report Status 02/01/2020 FINAL  Final   Organism ID, Bacteria KLEBSIELLA PNEUMONIAE (A)  Final      Susceptibility   Klebsiella pneumoniae - MIC*    AMPICILLIN >=32 RESISTANT Resistant     CEFAZOLIN <=4 SENSITIVE Sensitive     CEFTRIAXONE 0.5 SENSITIVE Sensitive     CIPROFLOXACIN <=0.25 SENSITIVE Sensitive     GENTAMICIN <=1 SENSITIVE Sensitive     IMIPENEM <=0.25 SENSITIVE Sensitive     NITROFURANTOIN 32 SENSITIVE Sensitive     TRIMETH/SULFA <=20 SENSITIVE Sensitive     AMPICILLIN/SULBACTAM 8 SENSITIVE Sensitive     PIP/TAZO 8 SENSITIVE Sensitive     * >=100,000 COLONIES/mL KLEBSIELLA PNEUMONIAE    Radiology Reports CT Head Wo Contrast  Result Date: 01/28/2020 CLINICAL DATA:  Altered mental status EXAM: CT HEAD WITHOUT CONTRAST TECHNIQUE: Contiguous axial images were obtained from the base of the skull through the vertex without intravenous contrast. COMPARISON:  12/23/2008 FINDINGS: Brain: No evidence of acute  infarction, hemorrhage, hydrocephalus, extra-axial collection or mass lesion/mass effect. There is a right frontal approach VP shunt with tip terminating in the right lateral ventricle. There is encephalomalacia along the course of the VP shunt which has slightly worsened since the prior study. There is atrophy and chronic microvascular ischemic changes. There is a persistent 1 cm density at the left frontal horn  periventricular white matter, stable from prior study. Vascular: No hyperdense vessel or unexpected calcification. Skull: Normal. Negative for fracture or focal lesion. Sinuses/Orbits: There is mucosal thickening of the maxillary sinuses and ethmoid air cells bilaterally. The remaining paranasal sinuses and mastoid air cells are essentially clear. Other: None. IMPRESSION: 1. No acute intracranial abnormality. 2. Chronic microvascular ischemic changes and atrophy which has progressed since prior study. 3. Well-positioned VP shunt. Electronically Signed   By: Constance Holster M.D.   On: 02/01/2020 19:32   DG Chest Portable 1 View  Result Date: 02/04/2020 CLINICAL DATA:  Altered mental status EXAM: PORTABLE CHEST 1 VIEW COMPARISON:  09/08/2014 FINDINGS: VP shunt traversing the right chest, continuous where seen. Prominent markings at the bases. No effusion, edema, or pneumothorax. Normal heart size and mediastinal contours. IMPRESSION: Indistinct opacity at the lung bases could be atelectasis (lung volumes are low) or infectious infiltrates. Electronically Signed   By: Monte Fantasia M.D.   On: 01/27/2020 17:47   CT RENAL STONE STUDY  Result Date: 01/30/2020 CLINICAL DATA:  Pyelonephritis. EXAM: CT ABDOMEN AND PELVIS WITHOUT CONTRAST TECHNIQUE: Multidetector CT imaging of the abdomen and pelvis was performed following the standard protocol without IV contrast. COMPARISON:  April 06, 2014. FINDINGS: Lower chest: Multiple airspace opacities are noted in the visualized lung bases concerning for multifocal pneumonia. Hepatobiliary: No focal liver abnormality is seen. No gallstones, gallbladder wall thickening, or biliary dilatation. Pancreas: Unremarkable. No pancreatic ductal dilatation or surrounding inflammatory changes. Spleen: Normal in size without focal abnormality. Adrenals/Urinary Tract: Adrenal glands are unremarkable. Kidneys are normal, without renal calculi, focal lesion, or hydronephrosis. Bladder is  unremarkable. Stomach/Bowel: Stomach is within normal limits. Appendix appears normal. No evidence of bowel wall thickening, distention, or inflammatory changes. Vascular/Lymphatic: Aortic atherosclerosis. No enlarged abdominal or pelvic lymph nodes. Reproductive: Prostate is unremarkable. Other: No abdominal wall hernia or abnormality. No abdominopelvic ascites. There is again noted right-sided ventriculoperitoneal shunt with distal tip in the right lower quadrant. Musculoskeletal: No acute or significant osseous findings. IMPRESSION: 1. Multiple airspace opacities are noted in the visualized lung bases concerning for multifocal pneumonia. 2. Aortic atherosclerosis. 3. No acute abnormality seen in the abdomen or pelvis. Aortic Atherosclerosis (ICD10-I70.0). Electronically Signed   By: Marijo Conception M.D.   On: 01/30/2020 10:48   ECHOCARDIOGRAM LIMITED  Result Date: 01/31/2020   ECHOCARDIOGRAM REPORT   Patient Name:   AUDRIC WEIDNER Date of Exam: 01/31/2020 Medical Rec #:  [id number]      Height:       70.0 in Accession #:    [id number]     Weight:       159.8 lb Date of Birth:  08/28/1941      BSA:          1.90 m Patient Age:    73 years       BP:           147/68 mmHg Patient Gender: M              HR:           95 bpm. Exam Location:  Inpatient Procedure: Limited Echo Indications:    Fever 780.6/R50.9  History:        Patient has no prior history of Echocardiogram examinations.                 Risk Factors:Hypertension, Dyslipidemia and Diabetes.  Sonographer:    Clayton Lefort RDCS (AE) Referring Phys: [id number] [place]  1. Left ventricular ejection fraction, by visual estimation, is 60 to 65%. The left ventricle has normal function. There is severely increased left ventricular hypertrophy of the basal septum.  2. Left ventricular diastolic parameters are consistent with Grade I diastolic dysfunction (impaired relaxation).  3. Elevated left ventricular end-diastolic pressure.  4. Global right  ventricle has normal systolic function.The right ventricular size is normal. No increase in right ventricular wall thickness.  5. Left atrial size was normal.  6. Right atrial size was normal.  7. Mild to moderate mitral annular calcification.  8. The mitral valve is normal in structure. No evidence of mitral valve regurgitation. No evidence of mitral stenosis.  9. The tricuspid valve is normal in structure. Tricuspid valve regurgitation is not demonstrated. 10. The aortic valve is tricuspid. Aortic valve regurgitation is not visualized. Mild aortic valve sclerosis without stenosis. 11. The pulmonic valve was normal in structure. Pulmonic valve regurgitation is not visualized. 12. The inferior vena cava is normal in size with greater than 50% respiratory variability, suggesting right atrial pressure of 3 mmHg. 13. TR signal is inadequate for assessing pulmonary artery systolic pressure. 14. Poor acoustical windows limit ability to adequately assess for vegetation. No obvious massess on valve. Recommend TEE if clinically indicated. FINDINGS  Left Ventricle: Left ventricular ejection fraction, by visual estimation, is 60 to 65%. The left ventricle has normal function. The left ventricle is not well visualized. There is severely increased left ventricular hypertrophy of the basal septum. Left  ventricular diastolic parameters are consistent with Grade I diastolic dysfunction (impaired relaxation). Elevated left ventricular end-diastolic pressure. Right Ventricle: The right ventricular size is normal. No increase in right ventricular wall thickness. Global RV systolic function is has normal systolic function. Left Atrium: Left atrial size was normal in size. Right Atrium: Right atrial size was normal in size Pericardium: There is no evidence of pericardial effusion. Mitral Valve: The mitral valve is normal in structure. Mild to moderate mitral annular calcification. No evidence of mitral valve regurgitation. No evidence  of mitral valve stenosis by observation. Tricuspid Valve: The tricuspid valve is normal in structure. Tricuspid valve regurgitation is not demonstrated. Aortic Valve: The aortic valve is tricuspid. Aortic valve regurgitation is not visualized. Mild aortic valve sclerosis is present, with no evidence of aortic valve stenosis. Pulmonic Valve: The pulmonic valve was normal in structure. Pulmonic valve regurgitation is not visualized. Pulmonic regurgitation is not visualized. Aorta: The aortic root, ascending aorta and aortic arch are all structurally normal, with no evidence of dilitation or obstruction. Venous: The inferior vena cava is normal in size with greater than 50% respiratory variability, suggesting right atrial pressure of 3 mmHg. IAS/Shunts: No atrial level shunt detected by color flow Doppler. There is no evidence of a patent foramen ovale. No ventricular septal defect is seen or detected. There is no evidence of an atrial septal defect.  LEFT VENTRICLE PLAX 2D LVIDd:         3.70 cm  Diastology LVIDs:         2.30 cm  LV e' lateral:   5.33 cm/s LV PW:  1.30 cm  LV E/e' lateral: 14.5 LV IVS:        1.60 cm  LV e' medial:    6.09 cm/s LVOT diam:     2.00 cm  LV E/e' medial:  12.7 LV SV:         40 ml LV SV Index:   21.12 LVOT Area:     3.14 cm  LEFT ATRIUM         Index LA diam:    2.70 cm 1.42 cm/m   AORTA Ao Root diam: 2.80 cm MITRAL VALVE MV Area (PHT): 7.99 cm              SHUNTS MV PHT:        27.55 msec            Systemic Diam: 2.00 cm MV Decel Time: 95 msec MV E velocity: 77.10 cm/s  103 cm/s MV A velocity: 118.00 cm/s 70.3 cm/s MV E/A ratio:  0.65        1.5  Fransico Him MD Electronically signed by Fransico Him MD Signature Date/Time: 01/31/2020/11:57:58 AM    Final    DG ESOPHAGUS W SINGLE CM (SOL OR THIN BA)  Result Date: 02/01/2020 CLINICAL DATA:  Mid chest pain and heartburn. Recent bedside speech pathology evaluation. COVID-19 infection. EXAM: SINGLE COLUMN BARIUM ENEMA TECHNIQUE:  Initial scout AP supine abdominal image obtained to insure adequate colon cleansing. Barium was introduced into the colon in a retrograde fashion and refluxed from the rectum to the cecum. Spot images of the colon followed by overhead radiographs were obtained. FLUOROSCOPY TIME:  Fluoroscopy Time: 1 minutes and 0 seconds of low-dose pulsed fluoroscopy Radiation Exposure Index (if provided by the fluoroscopic device): 8.7 mGy Number of Acquired Spot Images: 0 COMPARISON:  Chest radiographs 02/11/2020. Abdominal CT 01/30/2020. FINDINGS: The study was performed in the supine and right lateral decubitus positions. The esophageal motility appears normal. No laryngeal penetration or abnormality of the cervical esophagus was demonstrated. There is mild smooth narrowing of the distal esophageal lumen without mucosal ulceration. A 13 mm barium tablet was administered and was temporarily delayed in the cervical esophagus. This subsequently passed into the distal esophagus. Despite drinking water and thin barium, this tablet did not pass into the stomach during approximately 3 minutes of intermittent fluoroscopic observation with the patient supine and semi erect. IMPRESSION: 1. Mild smooth narrowing of the distal esophageal lumen, likely due to chronic reflux. No evidence of mucosal ulceration. 2. Barium tablet did not pass into the stomach during approximately 3 minutes of intermittent observation. Electronically Signed   By: Richardean Sale M.D.   On: 02/01/2020 14:25

## 2020-02-03 NOTE — Progress Notes (Signed)
Called patients contact to give an update on his status. No answer.

## 2020-02-03 NOTE — Progress Notes (Signed)
Occupational Therapy Treatment Patient Details Name: Bob Vazquez MRN: [id number] DOB: Nov 21, 1941 Today's Date: 02/03/2020    History of present illness 79 year old male with a history of type 2 diabetes, hypertension, hyperlipidemia, NPH (with VP shunt) who lives alone and family had not heard from him in several days.  Law enforcement did a wellness check 02/17/2020 and found patient in an empty bathtub.  No signs of trauma.  Patient has been awake though confused and was found to have UTI, likely sepsis and DKA and also Covid-19 positive. The patient is apparently cared for by his nephew who is hospitalized at [facility] with Covid-19.   OT comments  PTA, pt lived at home alone, Independent with ADLs and mobility. Pt progressing towards OT goals. Pt received in chair, reporting sleepy and requesting to return to bed. Due to recent orthostatic symptoms, assessed BP prior to transfer at 111/47. Also guided pt in simple exercises prior to standing to increase blood flow with pt able to follow directions with minimal cues. Pt also noted with O2 stats in 80s while seated on RA. Placed pt on 2 L O2 for safety prior to transfer with stats in 90s. Pt Min A for sit to stand and pivot to bed with 2 person HHA. After transfer, pt reporting dizziness with BP at 107/84. Pt min guard to return to supine. Pt setup for drinking from cup and washing face while seated. Trialed pt on RA in bed w/ destats to high 80s. Placed pt on 1 L O2 to improve stats/comfort and notified nurse. DC plan remains appropriate. Will continue to follow acutely.    Follow Up Recommendations  SNF;Supervision/Assistance - 24 hour    Equipment Recommendations  3 in 1 bedside commode;Other (comment)    Recommendations for Other Services      Precautions / Restrictions Precautions Precautions: Fall;Other (comment) Precaution Comments: orthostasis; Airborne and Contact  Restrictions Weight Bearing Restrictions: No       Mobility Bed  Mobility Overal bed mobility: Needs Assistance Bed Mobility: Sit to Supine     Supine to sit: Min guard        Transfers Overall transfer level: Needs assistance Equipment used: 2 person hand held assist Transfers: Sit to/from [facility] Sit to Stand: Min assist;+2 physical assistance;+2 safety/equipment         General transfer comment: Pt reporting dizziness with activity. Assessed Orthostatic BP    Balance Overall balance assessment: Needs assistance                                         ADL either performed or assessed with clinical judgement   ADL   Eating/Feeding: Set up;Sitting   Grooming: Set up;Wash/dry face;Sitting                               Functional mobility during ADLs: Minimal assistance;+2 for physical assistance;+2 for safety/equipment(Handheld assist)       Vision       Perception     Praxis      Cognition Arousal/Alertness: Awake/alert Behavior During Therapy: WFL for tasks assessed/performed Overall Cognitive Status: No family/caregiver present to determine baseline cognitive functioning  General Comments: follows simple commands (some repetition required ?HOH)        Exercises Exercises: Other exercises Other Exercises Other Exercises: Seated: ankle pumps, leg kicks, and shoulder flexion(Prior to transfer to increase blood flow)   Shoulder Instructions       General Comments      Pertinent Vitals/ Pain       Pain Assessment: Faces Faces Pain Scale: Hurts a little bit Pain Location: Soreness in chest, throat when he swallows Pain Descriptors / Indicators: Grimacing Pain Intervention(s): Limited activity within patient's tolerance;Monitored during session  Home Living                                          Prior Functioning/Environment              Frequency  Min 2X/week        Progress Toward  Goals  OT Goals(current goals can now be found in the care plan section)  Progress towards OT goals: Progressing toward goals  Acute Rehab OT Goals Patient Stated Goal: get stronger OT Goal Formulation: With patient Time For Goal Achievement: March 09, 2020 Potential to Achieve Goals: Good ADL Goals Pt Will Perform Grooming: with supervision;sitting;standing Pt Will Perform Lower Body Bathing: with supervision;sitting/lateral leans;sit to/from stand Pt Will Perform Upper Body Dressing: with set-up;with supervision;sitting Pt Will Perform Lower Body Dressing: with supervision;sit to/from stand;sitting/lateral leans Pt Will Transfer to Toilet: with supervision;ambulating Pt Will Perform Toileting - Clothing Manipulation and hygiene: with supervision;sit to/from stand;sitting/lateral leans Additional ADL Goal #1: Pt will demonstrate anticipatory awareness during ADL/mobility completion.  Plan Discharge plan remains appropriate    Co-evaluation          OT goals addressed during session: ADL's and self-care;Other (comment)(Safe transfers)      AM-PAC OT "6 Clicks" Daily Activity     Outcome Measure   Help from another person eating meals?: A Little Help from another person taking care of personal grooming?: A Little Help from another person toileting, which includes using toliet, bedpan, or urinal?: A Lot Help from another person bathing (including washing, rinsing, drying)?: A Lot Help from another person to put on and taking off regular upper body clothing?: A Little Help from another person to put on and taking off regular lower body clothing?: A Lot 6 Click Score: 15    End of Session Equipment Utilized During Treatment: Gait belt  OT Visit Diagnosis: Muscle weakness (generalized) (M62.81);Unsteadiness on feet (R26.81);Other symptoms and signs involving cognitive function   Activity Tolerance Patient limited by fatigue;Patient tolerated treatment well   Patient Left in  bed;with call bell/phone within reach;with bed alarm set   Nurse Communication Mobility status;Other (comment)(Oxygen status during session)        Time: [id number] OT Time Calculation (min): 35 min  Charges: OT General Charges $OT Visit: 1 Visit OT Treatments $Self Care/Home Management : 8-22 mins $Therapeutic Activity: 8-22 mins  Layla Maw, OTR/L   Layla Maw 02/03/2020, 3:55 PM

## 2020-02-04 LAB — CBC
HCT: 29.4 % — ABNORMAL LOW (ref 39.0–52.0)
Hemoglobin: 9.7 g/dL — ABNORMAL LOW (ref 13.0–17.0)
MCH: 27.9 pg (ref 26.0–34.0)
MCHC: 33 g/dL (ref 30.0–36.0)
MCV: 84.5 fL (ref 80.0–100.0)
Platelets: 291 10*3/uL (ref 150–400)
RBC: 3.48 MIL/uL — ABNORMAL LOW (ref 4.22–5.81)
RDW: 14.6 % (ref 11.5–15.5)
WBC: 12.5 10*3/uL — ABNORMAL HIGH (ref 4.0–10.5)
nRBC: 0.6 % — ABNORMAL HIGH (ref 0.0–0.2)

## 2020-02-04 LAB — GLUCOSE, CAPILLARY
Glucose-Capillary: 83 mg/dL (ref 70–99)
Glucose-Capillary: 155 mg/dL — ABNORMAL HIGH (ref 70–99)
Glucose-Capillary: 163 mg/dL — ABNORMAL HIGH (ref 70–99)
Glucose-Capillary: 164 mg/dL — ABNORMAL HIGH (ref 70–99)

## 2020-02-04 LAB — COMPREHENSIVE METABOLIC PANEL
ALT: 23 U/L (ref 0–44)
AST: 38 U/L (ref 15–41)
Albumin: 1.9 g/dL — ABNORMAL LOW (ref 3.5–5.0)
Alkaline Phosphatase: 53 U/L (ref 38–126)
Anion gap: 11 (ref 5–15)
BUN: 75 mg/dL — ABNORMAL HIGH (ref 8–23)
CO2: 21 mmol/L — ABNORMAL LOW (ref 22–32)
Calcium: 8.1 mg/dL — ABNORMAL LOW (ref 8.9–10.3)
Chloride: 108 mmol/L (ref 98–111)
Creatinine, Ser: 2.22 mg/dL — ABNORMAL HIGH (ref 0.61–1.24)
GFR calc Af Amer: 31 mL/min — ABNORMAL LOW (ref >60)
GFR calc non Af Amer: 27 mL/min — ABNORMAL LOW (ref >60)
Glucose, Bld: 66 mg/dL — ABNORMAL LOW (ref 70–99)
Potassium: 5.1 mmol/L (ref 3.5–5.1)
Sodium: 140 mmol/L (ref 135–145)
Total Bilirubin: 0.5 mg/dL (ref 0.3–1.2)
Total Protein: 5.9 g/dL — ABNORMAL LOW (ref 6.5–8.1)

## 2020-02-04 LAB — C-REACTIVE PROTEIN: CRP: 13.1 mg/dL — ABNORMAL HIGH (ref <1.0)

## 2020-02-04 LAB — FERRITIN: Ferritin: 519 ng/mL — ABNORMAL HIGH (ref 24–336)

## 2020-02-04 LAB — D-DIMER, QUANTITATIVE: D-Dimer, Quant: 1.23 ug/mL-FEU — ABNORMAL HIGH (ref 0.00–0.50)

## 2020-02-04 MED ORDER — SODIUM CHLORIDE 0.9 % IV SOLN: INTRAVENOUS | Status: DC

## 2020-02-04 MED ORDER — INSULIN GLARGINE 100 UNIT/ML ~~LOC~~ SOLN
10.0000 [IU] | Freq: Two times a day (BID) | SUBCUTANEOUS | Status: DC
  Administered 2020-02-04 – 2020-02-06 (×4): 10 [IU] via SUBCUTANEOUS
  Filled 2020-02-04 (×6): qty 0.1

## 2020-02-04 MED ORDER — MIDODRINE HCL 5 MG PO TABS: 2.5000 mg | ORAL_TABLET | Freq: Two times a day (BID) | ORAL | Status: DC

## 2020-02-04 NOTE — Consult Note (Signed)
UNASSIGNED  Reason for Consult: Odynophagia Referring Physician: [facility]  Thompson Grayer HPI: This is an 79 year old male with a PMH of HTN, DM, and normal pressure hydrocephalus admitted for confusion/metabolic encephalopathy and COVID-19 pneumonia.  He was found in a confused state when law enforcement was asked to check up on the patient.  His nephew, who looks after him, was in the hospital with COVID-19.  Over the course of his hospitalization his DKA resolved as well as his UTI.  On 02/02/2020 he started to complain of mild odynophagia.  The symptoms worsened in spite of treating him with fluconazole, PPI, and sucralfate.  The patient denies having a prior problem with odynophagia or dysphagia.  Past Medical History:  Diagnosis Date  . Chronic bronchitis ([place])   . Hyperlipemia   . Hypertension   . Migraines    "q now and then"  . NPH (normal pressure hydrocephalus) ([place])   . Prostatic abscess 04/12/14   transurethral resection of prostatic abscess  . Type II diabetes mellitus ([place])     Past Surgical History:  Procedure Laterality Date  . 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 TONSILLECTOMY  ~ 1980  . TRANSURETHRAL RESECTION OF PROSTATE N/A 04/12/2014   Procedure: TRANSURETHRAL RESECTION OF THE PROSTATE (TURP) Abscess;  Surgeon: Irine Seal, MD;  Location: [facility];  Service: Urology;  Laterality: N/A;  gyrus instruments    History reviewed. No pertinent family history.  Social History:  reports that he has never smoked. He has never used smokeless tobacco. He reports current alcohol use. He reports that he does not use drugs.  Allergies:  Allergies  Allergen Reactions  . Penicillins Swelling    Childhood reaction; "swelled my arm up"    Medications:  Scheduled: . aspirin EC  81 mg Oral QHS  . dexamethasone (DECADRON) injection  6 mg Intravenous Q24H  . heparin  5,000 Units Subcutaneous Q8H  . insulin aspart  0-15 Units Subcutaneous TID WC  . insulin aspart  0-5  Units Subcutaneous QHS  . insulin glargine  10 Units Subcutaneous BID  . pantoprazole (PROTONIX) IV  40 mg Intravenous Q12H  . sucralfate  1 g Oral TID WC & HS   Continuous: . sodium chloride 75 mL/hr at 02/04/20 1246  . fluconazole (DIFLUCAN) IV 100 mg (02/04/20 1500)    Results for orders placed or performed during the hospital encounter of 02/02/2020 (from the past 24 hour(s))  Glucose, capillary     Status: Abnormal   Collection Time: 02/03/20  7:53 PM  Result Value Ref Range   Glucose-Capillary 113 (H) 70 - 99 mg/dL  C-reactive protein     Status: Abnormal   Collection Time: 02/04/20  4:37 AM  Result Value Ref Range   CRP 13.1 (H) <1.0 mg/dL  Comprehensive metabolic panel     Status: Abnormal   Collection Time: 02/04/20  4:37 AM  Result Value Ref Range   Sodium 140 135 - 145 mmol/L   Potassium 5.1 3.5 - 5.1 mmol/L   Chloride 108 98 - 111 mmol/L   CO2 21 (L) 22 - 32 mmol/L   Glucose, Bld 66 (L) 70 - 99 mg/dL   BUN 75 (H) 8 - 23 mg/dL   Creatinine, Ser 2.22 (H) 0.61 - 1.24 mg/dL   Calcium 8.1 (L) 8.9 - 10.3 mg/dL   Total Protein 5.9 (L) 6.5 - 8.1 g/dL   Albumin 1.9 (L) 3.5 - 5.0 g/dL   AST 38  15 - 41 U/L   ALT 23 0 - 44 U/L   Alkaline Phosphatase 53 38 - 126 U/L   Total Bilirubin 0.5 0.3 - 1.2 mg/dL   GFR calc non Af Amer 27 (L) >60 mL/min   GFR calc Af Amer 31 (L) >60 mL/min   Anion gap 11 5 - 15  CBC     Status: Abnormal   Collection Time: 02/04/20  4:37 AM  Result Value Ref Range   WBC 12.5 (H) 4.0 - 10.5 K/uL   RBC 3.48 (L) 4.22 - 5.81 MIL/uL   Hemoglobin 9.7 (L) 13.0 - 17.0 g/dL   HCT 29.4 (L) 39.0 - 52.0 %   MCV 84.5 80.0 - 100.0 fL   MCH 27.9 26.0 - 34.0 pg   MCHC 33.0 30.0 - 36.0 g/dL   RDW 14.6 11.5 - 15.5 %   Platelets 291 150 - 400 K/uL   nRBC 0.6 (H) 0.0 - 0.2 %  D-dimer, quantitative (not at [facility])     Status: Abnormal   Collection Time: 02/04/20  4:37 AM  Result Value Ref Range   D-Dimer, Quant 1.23 (H) 0.00 - 0.50 ug/mL-FEU  Ferritin     Status:  Abnormal   Collection Time: 02/04/20  4:37 AM  Result Value Ref Range   Ferritin 519 (H) 24 - 336 ng/mL  Glucose, capillary     Status: None   Collection Time: 02/04/20  7:57 AM  Result Value Ref Range   Glucose-Capillary 83 70 - 99 mg/dL   Comment 1 Notify RN   Glucose, capillary     Status: Abnormal   Collection Time: 02/04/20 12:12 PM  Result Value Ref Range   Glucose-Capillary 155 (H) 70 - 99 mg/dL  Glucose, capillary     Status: Abnormal   Collection Time: 02/04/20  5:19 PM  Result Value Ref Range   Glucose-Capillary 163 (H) 70 - 99 mg/dL     No results found.  ROS:  As stated above in the HPI otherwise negative.  Blood pressure (!) 118/54, pulse 90, temperature 98.3 F (36.8 C), temperature source Axillary, resp. rate 16, height 5\' 10"  (1.778 m), weight 74.9 kg, SpO2 99 %.    PE: Gen: NAD, Alert and Oriented HEENT:  [place]/AT, EOMI Neck: Supple, no LAD Lungs: CTA Bilaterally CV: RRR without M/G/R ABM: Soft, NTND, +BS Ext: No C/C/E  Assessment/Plan: 1) Odynophagia. 2) DM. 3) HTN. 4) Hyperlipidemia.   The patient is mentally intact.  His odynophagia continues to persist and further evaluation will be performed with an EGD tomorrow.  Plan: 1) EGD.  Marcellas Marchant D 02/04/2020, 5:25 PM

## 2020-02-04 NOTE — Progress Notes (Signed)
Physical Therapy Treatment Patient Details Name: Bob Vazquez MRN: [id number] DOB: December 25, 1940 Today's Date: 02/04/2020    History of Present Illness 79 year old male with a history of type 2 diabetes, hypertension, hyperlipidemia, NPH (with VP shunt) who lives alone and family had not heard from him in several days.  Law enforcement did a wellness check 01/27/2020 and found patient in an empty bathtub.  No signs of trauma.  Patient has been awake though confused and was found to have UTI, likely sepsis and DKA and also Covid-19 positive. Progress with therapy limited by orthostasis. The patient is apparently cared for by his nephew who is hospitalized at [facility] with Covid-19.    PT Comments    Patient up in recliner on arrival; easily aroused but drowsy throughout session. Orthostatic BPs assessed (see vitals flowsheet) with thigh high TED hose in place. Patient with most severe orthostasis today and barely tolerated standing 3 seconds before insisting he had to sit (seated BP then 82/35; reclined and incr to 112/56). Patient intermittently dropping his sats to 88-89% on room air (with coughing, when dozing off). When stimulated remains 91-92%. RN and MD made aware of pt's continued decline in activity tolerance over the course of this week.    Follow Up Recommendations  SNF     Equipment Recommendations  Other (comment)(TBD at next venue)    Recommendations for Other Services       Precautions / Restrictions Precautions Precautions: Fall;Other (comment) Precaution Comments: orthostasis; Airborne and Contact  Restrictions Weight Bearing Restrictions: No    Mobility  Bed Mobility                  Transfers Overall transfer level: Needs assistance Equipment used: Rolling walker (2 wheeled) Transfers: Sit to/from Stand Sit to Stand: Min assist;+2 physical assistance;+2 safety/equipment         General transfer comment: Pt immediately reporting dizziness upon standing and  returned to sitting with BP drop to 82/35.  Ambulation/Gait                 Stairs             Wheelchair Mobility    Modified Rankin (Stroke Patients Only)       Balance Overall balance assessment: Needs assistance Sitting-balance support: Feet supported Sitting balance-Leahy Scale: Fair   Postural control: Posterior lean Standing balance support: Bilateral upper extremity supported Standing balance-Leahy Scale: Poor Standing balance comment: leaning backwards with posterior calves supporting him against bed frame                            Cognition Arousal/Alertness: Lethargic Behavior During Therapy: WFL for tasks assessed/performed Overall Cognitive Status: No family/caregiver present to determine baseline cognitive functioning                                 General Comments: follows simple commands (some repetition required ?HOH)      Exercises Other Exercises Other Exercises: Seated: ankle pumps, leg kicks, hand pumps    General Comments General comments (skin integrity, edema, etc.): Pt up in recliner (reclined) on arrival. On room air. Noted incr coughing with sats decreasing to 88-89% with slow return to 91-92%. Sats also dropping with sleeping (dozing off during conversation).       Pertinent Vitals/Pain Pain Assessment: Faces Faces Pain Scale: Hurts whole lot Pain Location: Soreness in chest,  throat when he swallows or coughs Pain Descriptors / Indicators: Grimacing Pain Intervention(s): Limited activity within patient's tolerance;Monitored during session    Home Living                      Prior Function            PT Goals (current goals can now be found in the care plan section) Acute Rehab PT Goals Patient Stated Goal: get stronger Time For Goal Achievement: 23-Feb-2020 Potential to Achieve Goals: Good Progress towards PT goals: Not progressing toward goals - comment    Frequency    Min  2X/week      PT Plan Current plan remains appropriate    Co-evaluation              AM-PAC PT "6 Clicks" Mobility   Outcome Measure  Help needed turning from your back to your side while in a flat bed without using bedrails?: A Little Help needed moving from lying on your back to sitting on the side of a flat bed without using bedrails?: A Little Help needed moving to and from a bed to a chair (including a wheelchair)?: A Lot Help needed standing up from a chair using your arms (e.g., wheelchair or bedside chair)?: A Lot Help needed to walk in hospital room?: Total Help needed climbing 3-5 steps with a railing? : Total 6 Click Score: 12    End of Session Equipment Utilized During Treatment: Other (comment)(thigh high TED hose) Activity Tolerance: Treatment limited secondary to medical complications (Comment) Patient left: in chair;with call bell/phone within reach;with chair alarm set Nurse Communication: Mobility status;Other (comment)(orthostasis with standing; decr sats; incr cough) PT Visit Diagnosis: Unsteadiness on feet (R26.81);Muscle weakness (generalized) (M62.81)     Time: [id number] PT Time Calculation (min) (ACUTE ONLY): 19 min  Charges:  $Therapeutic Activity: 8-22 mins                      [name], PT Pager [phone]    Rexanne Mano 02/04/2020, 11:36 AM

## 2020-02-04 NOTE — Progress Notes (Signed)
PROGRESS NOTE                                                                                                                                                                                                             Patient Demographics:    Bob Vazquez, is a 79 y.o. male, DOB - 1941/09/27, [place]:[id number]  Outpatient Primary MD for the patient is Nolene Ebbs, MD   Admit date - 01/30/2020   LOS - 6  Chief Complaint  Patient presents with  . Altered Mental Status  . Hyperglycemia       Brief Narrative: Patient is a 79 y.o. male with PMHx of DM-2, HTN, HLD, NPH-who was found very confused in an empty bathtub on a wellness check by law enforcement (family had not heard from him for several days)-he was subsequently brought to the ED-and was found to have acute metabolic encephalopathy in the setting of severe sepsis due to UTI, DKA and COVID-19 pneumonia.  He was admitted to the [facility] clinical stability transferred to the [facility] on 2/9.   Subjective:   Sitting in bedside chair-not on oxygen.  She continues to complain of odynophagia.   Assessment  & Plan :   Covid 19 Viral pneumonia: Has been transitioned to room air-however CRP has bumped up again-has completed a course of remdesivir-remains on steroids-follow closely.    Fever: afebrile  O2 requirements:  SpO2: (!) 88 % O2 Flow Rate (L/min): 1 L/min   COVID-19 Labs: Recent Labs    02/02/20 0437 02/03/20 0431 02/04/20 0437  DDIMER 2.09* 1.59* 1.23*  FERRITIN  --  441* 519*  LDH 325*  --   --   CRP 12.7* 9.2* 13.1*    No results found for: BNP  No results for input(s): PROCALCITON in the last 168 hours.  Lab Results  Component Value Date   SARSCOV2NAA POSITIVE (A) 02/21/2020   [place]iel[place] Not Detected 07/13/2019     COVID-19 Medications: Steroids: 2/5>> Remdesivir: 2/5>> 2/9  Prone/Incentive Spirometry: encouraged  incentive spirometry use 3-4/hour.  DVT Prophylaxis  : Heparin at intermediate dosing.  Severe sepsis secondary to UTI: Sepsis physiology has resolved-cultures positive for Klebsiella pneumonia and Streptococcus agalactiae-blood cultures negative.  Continue cefepime for a few more days.  Antibiotics: Ceftriaxone: 2/9>> Cefepime: 2/5>> 2/8  Acute metabolic encephalopathy: Secondary to DKA/AKI-improved-suspect patient  not far from usual baseline.  AKI on CKD stage IIIb: AKI likely hemodynamically mediated-creatinine slightly up today-poor oral intake secondary to severe odynophagia-starting gentle hydration with IV fluids.  Odynophagia: Continues to have severe odynophagia to both solids and liquids-in spite of being on Diflucan, PPI and Carafate.  Barium esophagogram on 2/8 showed some mild smooth narrowing in the distal esophagus-GI MD-Dr. Benson Norway consulted.  Keep n.p.o. post midnight for potential EGD in the morning.  DKA: Resolved.  Unfortunately patient ran out of his insulin-and could not get it refilled-likely provoking behavior along with Covid/UTI.  Insulin-dependent DM-2: continues to have borderline low CBG's-poor oral intake continues-decrease Lantus to 10 units BID-continue SSI and follow  Recent Labs    02/03/20 1621 02/03/20 1953 02/04/20 0757  GLUCAP 125* 113* 83   HTN: Controlled with amlodipine  HLD: Statins on hold-resume in the next few days.  Orthostatic Hypotension: probably secondary to dehydration/Diabetic neuropathy-stop amlodipine-gentle IVF hydration-re-assess-if no improvement-will start midodrine  Deconditioning/debility: Secondary to acute illness-PT/OT following-recommendations of SNF-social work consulted.  Consults  :  PCCM,GI  Procedures  :  None  ABG:    Component Value Date/Time   HCO3 20.6 01/30/2020 1900   TCO2 22 02/12/2020 1900   ACIDBASEDEF 7.0 (H) 02/16/2020 1900   O2SAT 92.0 01/26/2020 1900    Vent Settings: N/A  Condition -  Guarded  Family Communication  : Called brother-no answer-unable to leave voicemail on 2/9, 2/10 and 2/11  Code Status :  Full Code  Diet :  Diet Order            Diet NPO time specified  Diet effective midnight        DIET DYS 2 Room service appropriate? Yes; Fluid consistency: Thin  Diet effective now               Disposition Plan  :  Remain hospitalized-likely SNF on discharge over the next 2-3 days depending on clinical course.  Barriers to discharge: Hypoxia requiring O2 supplementation/remains on IV antibiotics/ongoing odynophagia-needing GI work up  Antimicorbials  :    Anti-infectives (From admission, onward)   Start     Dose/Rate Route Frequency Ordered Stop   02/02/20 1400  fluconazole (DIFLUCAN) IVPB 100 mg     100 mg 50 mL/hr over 60 Minutes Intravenous Every 24 hours 02/02/20 1341 02/16/2020 1359   02/02/20 1330  cefTRIAXone (ROCEPHIN) 1 g in sodium chloride 0.9 % 100 mL IVPB     1 g 200 mL/hr over 30 Minutes Intravenous Every 24 hours 02/02/20 1328 02/14/2020 1329   01/30/20 1830  ceFEPIme (MAXIPIME) 2 g in sodium chloride 0.9 % 100 mL IVPB  Status:  Discontinued     2 g 200 mL/hr over 30 Minutes Intravenous Every 24 hours 01/25/2020 1927 02/02/20 1328   01/30/20 1000  remdesivir 100 mg in sodium chloride 0.9 % 100 mL IVPB     100 mg 200 mL/hr over 30 Minutes Intravenous Daily 02/11/2020 2258 02/02/20 1000   01/30/2020 2300  remdesivir 200 mg in sodium chloride 0.9% 250 mL IVPB     200 mg 580 mL/hr over 30 Minutes Intravenous Once 02/02/2020 2258 01/30/20 0102   02/08/2020 1929  vancomycin variable dose per unstable renal function (pharmacist dosing)  Status:  Discontinued      Does not apply See admin instructions 02/04/2020 1929 01/30/20 1429   01/28/2020 1730  ceFEPIme (MAXIPIME) 2 g in sodium chloride 0.9 % 100 mL IVPB     2 g 200  mL/hr over 30 Minutes Intravenous  Once 02/21/2020 1716 02/19/2020 1935   02/02/2020 1730  metroNIDAZOLE (FLAGYL) IVPB 500 mg     500 mg 100  mL/hr over 60 Minutes Intravenous  Once 01/25/2020 1716 02/03/2020 2000   02/09/2020 1730  vancomycin (VANCOCIN) IVPB 1000 mg/200 mL premix     1,000 mg 200 mL/hr over 60 Minutes Intravenous  Once 02/19/2020 1716 01/31/2020 2005      Inpatient Medications  Scheduled Meds: . amLODipine  5 mg Oral Daily  . aspirin EC  81 mg Oral QHS  . dexamethasone (DECADRON) injection  6 mg Intravenous Q24H  . heparin  5,000 Units Subcutaneous Q8H  . insulin aspart  0-15 Units Subcutaneous TID WC  . insulin aspart  0-5 Units Subcutaneous QHS  . insulin glargine  15 Units Subcutaneous BID  . midodrine  2.5 mg Oral BID WC  . pantoprazole (PROTONIX) IV  40 mg Intravenous Q12H  . sucralfate  1 g Oral TID WC & HS   Continuous Infusions: . sodium chloride    . cefTRIAXone (ROCEPHIN)  IV 1 g (02/03/20 1219)  . fluconazole (DIFLUCAN) IV 100 mg (02/03/20 1258)   PRN Meds:.   Time Spent in minutes  25  See all Orders from today for further details   Oren Binet M.D on 02/04/2020 at 11:47 AM  To page go to [email or web address] - use universal password  [facility] -  Office  [phone]    Objective:   Vitals:   02/04/20 0455 02/04/20 0600 02/04/20 0608 02/04/20 1102  BP: (!) 129/54 (!) 116/51    Pulse:  77    Resp: 14 19    Temp: 98.4 F (36.9 C) 98.1 F (36.7 C) 98.1 F (36.7 C)   TempSrc: Oral Oral Oral   SpO2:  97%  (!) 88%  Weight:      Height:        Wt Readings from Last 3 Encounters:  02/04/20 74.9 kg  04/12/14 81.6 kg  11/12/13 81.6 kg     Intake/Output Summary (Last 24 hours) at 02/04/2020 1147 Last data filed at 02/04/2020 0502 Gross per 24 hour  Intake 60 ml  Output 950 ml  Net -890 ml     Physical Exam Gen Exam:Alert awake-not in any distress HEENT:atraumatic, normocephalic Chest: B/L clear to auscultation anteriorly CVS:S1S2 regular Abdomen:soft non tender, non distended Extremities:no edema Neurology: Non focal Skin: no rash   Data Review:     CBC Recent Labs  Lab 02/06/2020 1800 01/26/2020 1800 01/26/2020 1900 02/02/2020 2300 02/01/20 0410 02/03/20 0431 02/04/20 0437  WBC 12.7*  --   --  10.4 10.3 11.3* 12.5*  HGB 14.1   < > 15.0 11.4* 11.9* 11.4* 9.7*  HCT 45.0   < > 44.0 34.1* 36.1* 34.5* 29.4*  PLT 369  --   --  271 348 364 291  MCV 88.1  --   --  83.2 85.5 83.9 84.5  MCH 27.6  --   --  27.8 28.2 27.7 27.9  MCHC 31.3  --   --  33.4 33.0 33.0 33.0  RDW 14.0  --   --  13.7 14.6 14.4 14.6  LYMPHSABS 0.4*  --   --   --   --   --   --   MONOABS 0.6  --   --   --   --   --   --   EOSABS 0.0  --   --   --   --   --   --  BASOSABS 0.0  --   --   --   --   --   --    < > = values in this interval not displayed.    Chemistries  Recent Labs  Lab 02/08/2020 2300 01/30/20 0149 01/30/20 1132 01/31/20 0720 01/31/20 0857 02/01/20 0410 02/02/20 0437 02/03/20 0431 02/04/20 0437  NA 140   < >  --  144  --  143 141 139 140  K 4.7   < >  --  5.5*  --  5.4* 4.6 4.2 5.1  CL 107   < >  --  111  --  109 106 107 108  CO2 20*   < >  --  21*  --  24 23 22  21*  GLUCOSE 438*   < >  --  182*  --  215* 90 89 66*  BUN 66*   < >  --  67*  --  57* 55* 57* 75*  CREATININE 3.17*   < >  --  2.59*  --  2.21* 1.99* 1.99* 2.22*  CALCIUM 7.9*   < >  --  8.7*  --  8.5* 8.3* 8.2* 8.1*  MG 2.5*  --   --   --   --   --   --   --   --   AST  --    < >   < >  --  46* 40 34 38 38  ALT  --    < >   < >  --  28 26 26 27 23   ALKPHOS  --    < >   < >  --  65 58 56 60 53  BILITOT  --    < >   < >  --  0.8 0.6 0.7 0.4 0.5   < > = values in this interval not displayed.   ------------------------------------------------------------------------------------------------------------------ No results for input(s): CHOL, HDL, LDLCALC, TRIG, CHOLHDL, LDLDIRECT in the last 72 hours.  Lab Results  Component Value Date   HGBA1C 9.4 (H) 07/31/2012    ------------------------------------------------------------------------------------------------------------------ No results for input(s): TSH, T4TOTAL, T3FREE, THYROIDAB in the last 72 hours.  Invalid input(s): FREET3 ------------------------------------------------------------------------------------------------------------------ Recent Labs    02/03/20 0431 02/04/20 0437  FERRITIN 441* 519*    Coagulation profile Recent Labs  Lab 01/28/2020 1800  INR 1.2    Recent Labs    02/03/20 0431 02/04/20 0437  DDIMER 1.59* 1.23*    Cardiac Enzymes No results for input(s): CKMB, TROPONINI, MYOGLOBIN in the last 168 hours.  Invalid input(s): CK ------------------------------------------------------------------------------------------------------------------ No results found for: BNP  Micro Results Recent Results (from the past 240 hour(s))  MRSA PCR Screening     Status: None   Collection Time: 02/19/2020 12:49 AM   Specimen: Nasopharyngeal  Result Value Ref Range Status   MRSA by PCR NEGATIVE NEGATIVE Final    Comment:        The GeneXpert MRSA Assay (FDA approved for NASAL specimens only), is one component of a comprehensive MRSA colonization surveillance program. It is not intended to diagnose MRSA infection nor to guide or monitor treatment for MRSA infections. Performed at [facility], [place] [address], [place], [place] [address]   Culture, blood (routine x 2)     Status: None   Collection Time: 02/09/2020  5:03 PM   Specimen: BLOOD  Result Value Ref Range Status   Specimen Description BLOOD RIGHT ANTECUBITAL  Final   Special Requests   Final  BOTTLES DRAWN AEROBIC AND ANAEROBIC Blood Culture adequate volume   Culture   Final    NO GROWTH 5 DAYS Performed at [facility], [place] [address] [place], [place] [address]    Report Status 02/03/2020 FINAL  Final  Culture, blood (routine x 2)     Status: None   Collection Time: 02/07/2020  5:08 PM    Specimen: BLOOD LEFT HAND  Result Value Ref Range Status   Specimen Description BLOOD LEFT HAND  Final   Special Requests   Final    BOTTLES DRAWN AEROBIC ONLY Blood Culture results may not be optimal due to an inadequate volume of blood received in culture bottles   Culture   Final    NO GROWTH 5 DAYS Performed at [facility], [place] [address], [place], [place] [address]    Report Status 02/03/2020 FINAL  Final  Respiratory Panel by RT PCR (Flu A&B, Covid) - Nasopharyngeal Swab     Status: Abnormal   Collection Time: 02/01/2020  6:22 PM   Specimen: Nasopharyngeal Swab  Result Value Ref Range Status   SARS Coronavirus 2 by RT PCR POSITIVE (A) NEGATIVE Final    Comment: RESULT CALLED TO, READ BACK BY AND VERIFIED WITH: [place] 02/05/2020 A BROWNING (NOTE) SARS-CoV-2 target nucleic acids are DETECTED. SARS-CoV-2 RNA is generally detectable in upper respiratory specimens  during the acute phase of infection. Positive results are indicative of the presence of the identified virus, but do not rule out bacterial infection or co-infection with other pathogens not detected by the test. Clinical correlation with patient history and other diagnostic information is necessary to determine patient infection status. The expected result is Negative. Fact Sheet for Patients:  PinkCheek.be Fact Sheet for Healthcare Providers: GravelBags.it This test is not yet approved or cleared by the [place] FDA and  has been authorized for detection and/or diagnosis of SARS-CoV-2 by FDA under an Emergency Use Authorization (EUA).  This EUA will remain in effect (meaning this test can be used) f or the duration of  the COVID-19 declaration under Section 564(b)(1) of the Act, 21 U.S.C. section 360bbb-3(b)(1), unless the authorization is terminated or revoked sooner.    Influenza A by PCR NEGATIVE NEGATIVE Final   Influenza B by PCR  NEGATIVE NEGATIVE Final    Comment: (NOTE) The Xpert Xpress SARS-CoV-2/FLU/RSV assay is intended as an aid in  the diagnosis of influenza from Nasopharyngeal swab specimens and  should not be used as a sole basis for treatment. Nasal washings and  aspirates are unacceptable for Xpert Xpress SARS-CoV-2/FLU/RSV  testing. Fact Sheet for Patients: PinkCheek.be Fact Sheet for Healthcare Providers: GravelBags.it This test is not yet approved or cleared by the [place] FDA and  has been authorized for detection and/or diagnosis of SARS-CoV-2 by  FDA under an Emergency Use Authorization (EUA). This EUA will remain  in effect (meaning this test can be used) for the duration of the  Covid-19 declaration under Section 564(b)(1) of the Act, 21  U.S.C. section 360bbb-3(b)(1), unless the authorization is  terminated or revoked. Performed at [facility], [place] [address], [place], [place] [address]   Urine culture     Status: Abnormal   Collection Time: 02/02/2020  8:06 PM   Specimen: In/Out Cath Urine  Result Value Ref Range Status   Specimen Description IN/OUT CATH URINE  Final   Special Requests NONE  Final   Culture (A)  Final    >=  100,000 COLONIES/mL KLEBSIELLA PNEUMONIAE 80,000 COLONIES/mL STREPTOCOCCUS AGALACTIAE TESTING AGAINST S. AGALACTIAE NOT ROUTINELY PERFORMED DUE TO PREDICTABILITY OF AMP/PEN/VAN SUSCEPTIBILITY. Performed at [facility], [place] [address], [place], [place] [address]    Report Status 02/01/2020 FINAL  Final   Organism ID, Bacteria KLEBSIELLA PNEUMONIAE (A)  Final      Susceptibility   Klebsiella pneumoniae - MIC*    AMPICILLIN >=32 RESISTANT Resistant     CEFAZOLIN <=4 SENSITIVE Sensitive     CEFTRIAXONE 0.5 SENSITIVE Sensitive     CIPROFLOXACIN <=0.25 SENSITIVE Sensitive     GENTAMICIN <=1 SENSITIVE Sensitive     IMIPENEM <=0.25 SENSITIVE Sensitive     NITROFURANTOIN 32 SENSITIVE Sensitive      TRIMETH/SULFA <=20 SENSITIVE Sensitive     AMPICILLIN/SULBACTAM 8 SENSITIVE Sensitive     PIP/TAZO 8 SENSITIVE Sensitive     * >=100,000 COLONIES/mL KLEBSIELLA PNEUMONIAE    Radiology Reports CT Head Wo Contrast  Result Date: 02/17/2020 CLINICAL DATA:  Altered mental status EXAM: CT HEAD WITHOUT CONTRAST TECHNIQUE: Contiguous axial images were obtained from the base of the skull through the vertex without intravenous contrast. COMPARISON:  12/23/2008 FINDINGS: Brain: No evidence of acute infarction, hemorrhage, hydrocephalus, extra-axial collection or mass lesion/mass effect. There is a right frontal approach VP shunt with tip terminating in the right lateral ventricle. There is encephalomalacia along the course of the VP shunt which has slightly worsened since the prior study. There is atrophy and chronic microvascular ischemic changes. There is a persistent 1 cm density at the left frontal horn periventricular white matter, stable from prior study. Vascular: No hyperdense vessel or unexpected calcification. Skull: Normal. Negative for fracture or focal lesion. Sinuses/Orbits: There is mucosal thickening of the maxillary sinuses and ethmoid air cells bilaterally. The remaining paranasal sinuses and mastoid air cells are essentially clear. Other: None. IMPRESSION: 1. No acute intracranial abnormality. 2. Chronic microvascular ischemic changes and atrophy which has progressed since prior study. 3. Well-positioned VP shunt. Electronically Signed   By: Constance Holster M.D.   On: 02/18/2020 19:32   DG Chest Portable 1 View  Result Date: 02/03/2020 CLINICAL DATA:  Altered mental status EXAM: PORTABLE CHEST 1 VIEW COMPARISON:  09/08/2014 FINDINGS: VP shunt traversing the right chest, continuous where seen. Prominent markings at the bases. No effusion, edema, or pneumothorax. Normal heart size and mediastinal contours. IMPRESSION: Indistinct opacity at the lung bases could be atelectasis (lung volumes are  low) or infectious infiltrates. Electronically Signed   By: Monte Fantasia M.D.   On: 02/11/2020 17:47   CT RENAL STONE STUDY  Result Date: 01/30/2020 CLINICAL DATA:  Pyelonephritis. EXAM: CT ABDOMEN AND PELVIS WITHOUT CONTRAST TECHNIQUE: Multidetector CT imaging of the abdomen and pelvis was performed following the standard protocol without IV contrast. COMPARISON:  April 06, 2014. FINDINGS: Lower chest: Multiple airspace opacities are noted in the visualized lung bases concerning for multifocal pneumonia. Hepatobiliary: No focal liver abnormality is seen. No gallstones, gallbladder wall thickening, or biliary dilatation. Pancreas: Unremarkable. No pancreatic ductal dilatation or surrounding inflammatory changes. Spleen: Normal in size without focal abnormality. Adrenals/Urinary Tract: Adrenal glands are unremarkable. Kidneys are normal, without renal calculi, focal lesion, or hydronephrosis. Bladder is unremarkable. Stomach/Bowel: Stomach is within normal limits. Appendix appears normal. No evidence of bowel wall thickening, distention, or inflammatory changes. Vascular/Lymphatic: Aortic atherosclerosis. No enlarged abdominal or pelvic lymph nodes. Reproductive: Prostate is unremarkable. Other: No abdominal wall hernia or abnormality. No abdominopelvic ascites. There is again noted right-sided ventriculoperitoneal shunt with  distal tip in the right lower quadrant. Musculoskeletal: No acute or significant osseous findings. IMPRESSION: 1. Multiple airspace opacities are noted in the visualized lung bases concerning for multifocal pneumonia. 2. Aortic atherosclerosis. 3. No acute abnormality seen in the abdomen or pelvis. Aortic Atherosclerosis (ICD10-I70.0). Electronically Signed   By: Marijo Conception M.D.   On: 01/30/2020 10:48   ECHOCARDIOGRAM LIMITED  Result Date: 01/31/2020   ECHOCARDIOGRAM REPORT   Patient Name:   DEMETRION BLUNK Date of Exam: 01/31/2020 Medical Rec #:  [id number]      Height:       70.0 in  Accession #:    [id number]     Weight:       159.8 lb Date of Birth:  07/11/1941      BSA:          1.90 m Patient Age:    49 years       BP:           147/68 mmHg Patient Gender: M              HR:           95 bpm. Exam Location:  Inpatient Procedure: Limited Echo Indications:    Fever 780.6/R50.9  History:        Patient has no prior history of Echocardiogram examinations.                 Risk Factors:Hypertension, Dyslipidemia and Diabetes.  Sonographer:    Clayton Lefort RDCS (AE) Referring Phys: [id number] [place]  1. Left ventricular ejection fraction, by visual estimation, is 60 to 65%. The left ventricle has normal function. There is severely increased left ventricular hypertrophy of the basal septum.  2. Left ventricular diastolic parameters are consistent with Grade I diastolic dysfunction (impaired relaxation).  3. Elevated left ventricular end-diastolic pressure.  4. Global right ventricle has normal systolic function.The right ventricular size is normal. No increase in right ventricular wall thickness.  5. Left atrial size was normal.  6. Right atrial size was normal.  7. Mild to moderate mitral annular calcification.  8. The mitral valve is normal in structure. No evidence of mitral valve regurgitation. No evidence of mitral stenosis.  9. The tricuspid valve is normal in structure. Tricuspid valve regurgitation is not demonstrated. 10. The aortic valve is tricuspid. Aortic valve regurgitation is not visualized. Mild aortic valve sclerosis without stenosis. 11. The pulmonic valve was normal in structure. Pulmonic valve regurgitation is not visualized. 12. The inferior vena cava is normal in size with greater than 50% respiratory variability, suggesting right atrial pressure of 3 mmHg. 13. TR signal is inadequate for assessing pulmonary artery systolic pressure. 14. Poor acoustical windows limit ability to adequately assess for vegetation. No obvious massess on valve. Recommend TEE if  clinically indicated. FINDINGS  Left Ventricle: Left ventricular ejection fraction, by visual estimation, is 60 to 65%. The left ventricle has normal function. The left ventricle is not well visualized. There is severely increased left ventricular hypertrophy of the basal septum. Left  ventricular diastolic parameters are consistent with Grade I diastolic dysfunction (impaired relaxation). Elevated left ventricular end-diastolic pressure. Right Ventricle: The right ventricular size is normal. No increase in right ventricular wall thickness. Global RV systolic function is has normal systolic function. Left Atrium: Left atrial size was normal in size. Right Atrium: Right atrial size was normal in size Pericardium: There is no evidence of pericardial effusion. Mitral Valve: The mitral valve is  normal in structure. Mild to moderate mitral annular calcification. No evidence of mitral valve regurgitation. No evidence of mitral valve stenosis by observation. Tricuspid Valve: The tricuspid valve is normal in structure. Tricuspid valve regurgitation is not demonstrated. Aortic Valve: The aortic valve is tricuspid. Aortic valve regurgitation is not visualized. Mild aortic valve sclerosis is present, with no evidence of aortic valve stenosis. Pulmonic Valve: The pulmonic valve was normal in structure. Pulmonic valve regurgitation is not visualized. Pulmonic regurgitation is not visualized. Aorta: The aortic root, ascending aorta and aortic arch are all structurally normal, with no evidence of dilitation or obstruction. Venous: The inferior vena cava is normal in size with greater than 50% respiratory variability, suggesting right atrial pressure of 3 mmHg. IAS/Shunts: No atrial level shunt detected by color flow Doppler. There is no evidence of a patent foramen ovale. No ventricular septal defect is seen or detected. There is no evidence of an atrial septal defect.  LEFT VENTRICLE PLAX 2D LVIDd:         3.70 cm  Diastology  LVIDs:         2.30 cm  LV e' lateral:   5.33 cm/s LV PW:         1.30 cm  LV E/e' lateral: 14.5 LV IVS:        1.60 cm  LV e' medial:    6.09 cm/s LVOT diam:     2.00 cm  LV E/e' medial:  12.7 LV SV:         40 ml LV SV Index:   21.12 LVOT Area:     3.14 cm  LEFT ATRIUM         Index LA diam:    2.70 cm 1.42 cm/m   AORTA Ao Root diam: 2.80 cm MITRAL VALVE MV Area (PHT): 7.99 cm              SHUNTS MV PHT:        27.55 msec            Systemic Diam: 2.00 cm MV Decel Time: 95 msec MV E velocity: 77.10 cm/s  103 cm/s MV A velocity: 118.00 cm/s 70.3 cm/s MV E/A ratio:  0.65        1.5  Fransico Him MD Electronically signed by Fransico Him MD Signature Date/Time: 01/31/2020/11:57:58 AM    Final    DG ESOPHAGUS W SINGLE CM (SOL OR THIN BA)  Result Date: 02/01/2020 CLINICAL DATA:  Mid chest pain and heartburn. Recent bedside speech pathology evaluation. COVID-19 infection. EXAM: SINGLE COLUMN BARIUM ENEMA TECHNIQUE: Initial scout AP supine abdominal image obtained to insure adequate colon cleansing. Barium was introduced into the colon in a retrograde fashion and refluxed from the rectum to the cecum. Spot images of the colon followed by overhead radiographs were obtained. FLUOROSCOPY TIME:  Fluoroscopy Time: 1 minutes and 0 seconds of low-dose pulsed fluoroscopy Radiation Exposure Index (if provided by the fluoroscopic device): 8.7 mGy Number of Acquired Spot Images: 0 COMPARISON:  Chest radiographs 01/27/2020. Abdominal CT 01/30/2020. FINDINGS: The study was performed in the supine and right lateral decubitus positions. The esophageal motility appears normal. No laryngeal penetration or abnormality of the cervical esophagus was demonstrated. There is mild smooth narrowing of the distal esophageal lumen without mucosal ulceration. A 13 mm barium tablet was administered and was temporarily delayed in the cervical esophagus. This subsequently passed into the distal esophagus. Despite drinking water and thin barium,  this tablet did not pass into the stomach  during approximately 3 minutes of intermittent fluoroscopic observation with the patient supine and semi erect. IMPRESSION: 1. Mild smooth narrowing of the distal esophageal lumen, likely due to chronic reflux. No evidence of mucosal ulceration. 2. Barium tablet did not pass into the stomach during approximately 3 minutes of intermittent observation. Electronically Signed   By: Richardean Sale M.D.   On: 02/01/2020 14:25

## 2020-02-05 ENCOUNTER — Encounter (HOSPITAL_COMMUNITY): Admission: EM | Disposition: E | Payer: Self-pay | Source: Home / Self Care | Attending: Internal Medicine

## 2020-02-05 ENCOUNTER — Inpatient Hospital Stay (HOSPITAL_COMMUNITY): Payer: Medicare Other | Admitting: Registered Nurse

## 2020-02-05 ENCOUNTER — Encounter (HOSPITAL_COMMUNITY): Payer: Self-pay | Admitting: Specialist

## 2020-02-05 HISTORY — PX: HEMOSTASIS CLIP PLACEMENT: SHX6857

## 2020-02-05 HISTORY — PX: SCLEROTHERAPY: SHX6841

## 2020-02-05 HISTORY — PX: ESOPHAGOGASTRODUODENOSCOPY (EGD) WITH PROPOFOL: SHX5813

## 2020-02-05 HISTORY — PX: BIOPSY: SHX5522

## 2020-02-05 HISTORY — PX: HOT HEMOSTASIS: SHX5433

## 2020-02-05 LAB — CBC
HCT: 25.6 % — ABNORMAL LOW (ref 39.0–52.0)
Hemoglobin: 8.2 g/dL — ABNORMAL LOW (ref 13.0–17.0)
MCH: 27.6 pg (ref 26.0–34.0)
MCHC: 32 g/dL (ref 30.0–36.0)
MCV: 86.2 fL (ref 80.0–100.0)
Platelets: 357 10*3/uL (ref 150–400)
RBC: 2.97 MIL/uL — ABNORMAL LOW (ref 4.22–5.81)
RDW: 14.9 % (ref 11.5–15.5)
WBC: 17.7 10*3/uL — ABNORMAL HIGH (ref 4.0–10.5)
nRBC: 0.8 % — ABNORMAL HIGH (ref 0.0–0.2)

## 2020-02-05 LAB — C-REACTIVE PROTEIN: CRP: 9.6 mg/dL — ABNORMAL HIGH (ref <1.0)

## 2020-02-05 LAB — GLUCOSE, CAPILLARY
Glucose-Capillary: 121 mg/dL — ABNORMAL HIGH (ref 70–99)
Glucose-Capillary: 107 mg/dL — ABNORMAL HIGH (ref 70–99)
Glucose-Capillary: 107 mg/dL — ABNORMAL HIGH (ref 70–99)
Glucose-Capillary: 128 mg/dL — ABNORMAL HIGH (ref 70–99)

## 2020-02-05 LAB — COMPREHENSIVE METABOLIC PANEL
ALT: 21 U/L (ref 0–44)
AST: 32 U/L (ref 15–41)
Albumin: 1.9 g/dL — ABNORMAL LOW (ref 3.5–5.0)
Alkaline Phosphatase: 50 U/L (ref 38–126)
Anion gap: 10 (ref 5–15)
BUN: 84 mg/dL — ABNORMAL HIGH (ref 8–23)
CO2: 21 mmol/L — ABNORMAL LOW (ref 22–32)
Calcium: 7.9 mg/dL — ABNORMAL LOW (ref 8.9–10.3)
Chloride: 111 mmol/L (ref 98–111)
Creatinine, Ser: 2.35 mg/dL — ABNORMAL HIGH (ref 0.61–1.24)
GFR calc Af Amer: 29 mL/min — ABNORMAL LOW (ref >60)
GFR calc non Af Amer: 25 mL/min — ABNORMAL LOW (ref >60)
Glucose, Bld: 66 mg/dL — ABNORMAL LOW (ref 70–99)
Potassium: 5.2 mmol/L — ABNORMAL HIGH (ref 3.5–5.1)
Sodium: 142 mmol/L (ref 135–145)
Total Bilirubin: 0.5 mg/dL (ref 0.3–1.2)
Total Protein: 5.8 g/dL — ABNORMAL LOW (ref 6.5–8.1)

## 2020-02-05 LAB — D-DIMER, QUANTITATIVE: D-Dimer, Quant: 1.11 ug/mL-FEU — ABNORMAL HIGH (ref 0.00–0.50)

## 2020-02-05 LAB — FERRITIN: Ferritin: 506 ng/mL — ABNORMAL HIGH (ref 24–336)

## 2020-02-05 SURGERY — ESOPHAGOGASTRODUODENOSCOPY (EGD) WITH PROPOFOL
Anesthesia: Monitor Anesthesia Care

## 2020-02-05 MED ORDER — PROPOFOL 10 MG/ML IV BOLUS
INTRAVENOUS | Status: DC | PRN
  Administered 2020-02-05: 11 mg via INTRAVENOUS

## 2020-02-05 MED ORDER — EPINEPHRINE 1 MG/10ML IJ SOSY
PREFILLED_SYRINGE | INTRAMUSCULAR | Status: AC
  Filled 2020-02-05: qty 10

## 2020-02-05 MED ORDER — LIDOCAINE HCL (CARDIAC) PF 100 MG/5ML IV SOSY
PREFILLED_SYRINGE | INTRAVENOUS | Status: DC | PRN
  Administered 2020-02-05: 50 mg via INTRAVENOUS

## 2020-02-05 MED ORDER — SODIUM CHLORIDE (PF) 0.9 % IJ SOLN
PREFILLED_SYRINGE | INTRAMUSCULAR | Status: DC | PRN
  Administered 2020-02-05: 5 mL

## 2020-02-05 MED ORDER — PROPOFOL 500 MG/50ML IV EMUL
INTRAVENOUS | Status: DC | PRN
  Administered 2020-02-05: 50 ug/kg/min via INTRAVENOUS

## 2020-02-05 MED ORDER — PHENYLEPHRINE 40 MCG/ML (10ML) SYRINGE FOR IV PUSH (FOR BLOOD PRESSURE SUPPORT)
PREFILLED_SYRINGE | INTRAVENOUS | Status: DC | PRN
  Administered 2020-02-05 (×2): 80 ug via INTRAVENOUS

## 2020-02-05 MED ORDER — SODIUM ZIRCONIUM CYCLOSILICATE 10 G PO PACK
10.0000 g | PACK | Freq: Every day | ORAL | Status: DC
  Administered 2020-02-05 – 2020-02-08 (×4): 10 g via ORAL
  Filled 2020-02-05 (×5): qty 1

## 2020-02-05 SURGICAL SUPPLY — 15 items

## 2020-02-05 NOTE — TOC Progression Note (Signed)
Transition of Care [facility]) - Progression Note    Patient Details  Name: Bob Vazquez MRN: [id number] Date of Birth: [birth year]  Transition of Care [facility]) CM/SW [place], Melba Phone Number: 02/19/2020, 5:20 PM  Clinical Narrative:    Insurance approval received: [id number], Ref [id number], for 5 days next review 2/15. Will continue to follow for medical stability to [facility].    Expected Discharge Plan: [place] Barriers to Discharge: Continued Medical Work up  Expected Discharge Plan and Services Expected Discharge Plan: [place] arrangements for the past 2 months: Single Family Home                                       Social Determinants of Health (SDOH) Interventions    Readmission Risk Interventions No flowsheet data found.

## 2020-02-05 NOTE — Progress Notes (Signed)
SLP Cancellation Note  Patient Details Name: Bob Vazquez MRN: [id number] DOB: 03/07/1941   Cancelled treatment:       Reason Eval/Treat Not Completed: Other (comment). Plan for EGD today. Will f/u next week as needed.    Krew Hortman, Katherene Ponto 01/31/2020, 8:12 AM

## 2020-02-05 NOTE — Transfer of Care (Signed)
Immediate Anesthesia Transfer of Care Note  Patient: Bob Vazquez  Procedure(s) Performed: ESOPHAGOGASTRODUODENOSCOPY (EGD) WITH PROPOFOL (N/A ) HOT HEMOSTASIS (ARGON PLASMA COAGULATION/BICAP) (N/A ) BIOPSY HEMOSTASIS CLIP PLACEMENT SCLEROTHERAPY  Patient Location: Endoscopy Unit  Anesthesia Type:MAC  Level of Consciousness: awake, alert  and oriented  Airway & Oxygen Therapy: Patient Spontanous Breathing, Patient connected to nasal cannula oxygen and Patient connected to face mask oxygen  Post-op Assessment: Report given to RN and Post -op Vital signs reviewed and stable  Post vital signs: Reviewed and stable  Last Vitals:  Vitals Value Taken Time  BP 104/53 02/16/2020 1620  Temp    Pulse 81 02/10/2020 1620  Resp 10 01/28/2020 1620  SpO2 95 % 01/27/2020 1620    Last Pain:  Vitals:   01/30/2020 1526  TempSrc:   PainSc: 0-No pain         Complications: No apparent anesthesia complications

## 2020-02-05 NOTE — Anesthesia Preprocedure Evaluation (Signed)
Anesthesia Evaluation  Patient identified by MRN, date of birth, ID band Patient awake    Reviewed: Allergy & Precautions, H&P , NPO status , Patient's Chart, lab work & pertinent test results  Airway Mallampati: II  TM Distance: >3 FB Neck ROM: Full    Dental no notable dental hx.    Pulmonary neg pulmonary ROS,    Pulmonary exam normal breath sounds clear to auscultation       Cardiovascular Exercise Tolerance: Good hypertension, Pt. on medications Normal cardiovascular exam Rhythm:Regular Rate:Normal     Neuro/Psych  Headaches, negative psych ROS   GI/Hepatic negative GI ROS, Neg liver ROS,   Endo/Other  negative endocrine ROSdiabetes, Type 2, Oral Hypoglycemic Agents  Renal/GU Renal InsufficiencyRenal disease  negative genitourinary   Musculoskeletal negative musculoskeletal ROS (+)   Abdominal   Peds negative pediatric ROS (+)  Hematology negative hematology ROS (+)   Anesthesia Other Findings   Reproductive/Obstetrics negative OB ROS                             Anesthesia Physical  Anesthesia Plan  ASA: III  Anesthesia Plan: MAC   Post-op Pain Management:    Induction: Intravenous  PONV Risk Score and Plan: 1 and Treatment may vary due to age or medical condition  Airway Management Planned: Nasal Cannula  Additional Equipment:   Intra-op Plan:   Post-operative Plan:   Informed Consent: I have reviewed the patients History and Physical, chart, labs and discussed the procedure including the risks, benefits and alternatives for the proposed anesthesia with the patient or authorized representative who has indicated his/her understanding and acceptance.     Dental advisory given  Plan Discussed with: CRNA  Anesthesia Plan Comments:         Anesthesia Quick Evaluation

## 2020-02-05 NOTE — Progress Notes (Addendum)
PROGRESS NOTE                                                                                                                                                                                                             Patient Demographics:    Bob Vazquez, is a 79 y.o. male, DOB - 1941-05-06, [id number]  Outpatient Primary MD for the patient is Nolene Ebbs, MD   Admit date - 02/07/2020   LOS - 7  Chief Complaint  Patient presents with  . Altered Mental Status  . Hyperglycemia       Brief Narrative: Patient is a 79 y.o. male with PMHx of DM-2, HTN, HLD, NPH-who was found very confused in an empty bathtub on a wellness check by law enforcement (family had not heard from him for several days)-he was subsequently brought to the ED-and was found to have acute metabolic encephalopathy in the setting of severe sepsis due to UTI, DKA and COVID-19 pneumonia.  He was admitted to the [facility] clinical stability transferred to the [facility] on 2/9.   Subjective:   Lying comfortably in bed-on room air-continues to complain of odynophagia.  He complains that he is very fatigued and weak.   Assessment  & Plan :   Covid 19 Viral pneumonia: Stable on room air-has completed a course of remdesivir-CRP slowly downtrending now-continue steroids for a few more days.  Fever: afebrile  O2 requirements:  SpO2: 100 % O2 Flow Rate (L/min): 1 L/min   COVID-19 Labs: Recent Labs    02/03/20 0431 02/04/20 0437 02/17/2020 0305  DDIMER 1.59* 1.23* 1.11*  FERRITIN 441* 519* 506*  CRP 9.2* 13.1* 9.6*    No results found for: BNP  No results for input(s): PROCALCITON in the last 168 hours.  Lab Results  Component Value Date   SARSCOV2NAA POSITIVE (A) 02/13/2020   [place] Not Detected 07/13/2019     COVID-19 Medications: Steroids: 2/5>> Remdesivir: 2/5>> 2/9  Prone/Incentive Spirometry: encouraged incentive  spirometry use 3-4/hour.  DVT Prophylaxis  : Heparin at intermediate dosing.  Severe sepsis secondary to UTI: Sepsis physiology has resolved-cultures positive for Klebsiella pneumonia and Streptococcus agalactiae-blood cultures negative.  Has completed a course of antimicrobial therapy.  Antibiotics: Ceftriaxone: 2/9>>2/11 Cefepime: 2/5>> 2/8  Acute metabolic encephalopathy: Secondary to DKA/AKI-improved-suspect patient not far from usual baseline.  AKI on  CKD stage IIIb: AKI likely hemodynamically mediated-creatinine continues to slightly increase-likely secondary to poor oral intake due to odynophagia-continue IV fluids.  Have asked nursing staff to do bladder scans-however on exam I do not feel the distended bladder.  Odynophagia: Continues to have severe odynophagia to both solids and liquids-in spite of being on Diflucan, PPI and Carafate.  Barium esophagogram on 2/8 showed some mild smooth narrowing in the distal esophagus.  EGD scheduled for today-Dr. Benson Norway following.  Anemia: likely secondary to acute illness-no evidence of GI blood loss-follow  DKA: Resolved.  Unfortunately patient ran out of his insulin-and could not get it refilled-likely provoking behavior along with Covid/UTI.  Hyperkalemia: Mild-on Lokelma-follow.  Repeat electrolytes tomorrow.  Insulin-dependent DM-2: CBG stable-continue Lantus 10 units twice daily and SSI.  Follow.  Recent Labs    02/04/20 2114 01/26/2020 0743 02/18/2020 1158  GLUCAP 164* 121* 107*   HTN: Controlled-not on any antihypertensives.  HLD: Statins on hold-resume in the next few days.  Orthostatic Hypotension: probably secondary to dehydration/Diabetic neuropathy-stop amlodipine-gentle IVF hydration-re-assess-if no improvement-will start midodrine  Deconditioning/debility: Secondary to acute illness-PT/OT following-recommendations of SNF-social work consulted.  Consults  :  PCCM,GI  Procedures  :  None  ABG:    Component Value  Date/Time   HCO3 20.6 01/25/2020 1900   TCO2 22 02/12/2020 1900   ACIDBASEDEF 7.0 (H) 02/02/2020 1900   O2SAT 92.0 01/26/2020 1900    Vent Settings: N/A  Condition - Guarded  Family Communication  : Called brother-no answer-unable to leave voicemail on 2/9, 2/10 and 2/11-finally spoke with patient's niece-who then got me in touch with patient's brother-who I spoke to on the phone on 2/12.  Code Status :  Full Code  Diet :  Diet Order            Diet NPO time specified  Diet effective midnight               Disposition Plan  :  Remain hospitalized-likely SNF on discharge over the next 2-3 days depending on clinical course.  Barriers to discharge: Resolving Covid pneumonia/respiratory failure-severe odynophagia requiring EGD scheduled for today-ongoing AKI.  Antimicorbials  :    Anti-infectives (From admission, onward)   Start     Dose/Rate Route Frequency Ordered Stop   02/02/20 1400  fluconazole (DIFLUCAN) IVPB 100 mg     100 mg 50 mL/hr over 60 Minutes Intravenous Every 24 hours 02/02/20 1341 02/01/2020 1359   02/02/20 1330  cefTRIAXone (ROCEPHIN) 1 g in sodium chloride 0.9 % 100 mL IVPB     1 g 200 mL/hr over 30 Minutes Intravenous Every 24 hours 02/02/20 1328 02/04/20 1234   01/30/20 1830  ceFEPIme (MAXIPIME) 2 g in sodium chloride 0.9 % 100 mL IVPB  Status:  Discontinued     2 g 200 mL/hr over 30 Minutes Intravenous Every 24 hours 02/11/2020 1927 02/02/20 1328   01/30/20 1000  remdesivir 100 mg in sodium chloride 0.9 % 100 mL IVPB     100 mg 200 mL/hr over 30 Minutes Intravenous Daily 02/03/2020 2258 02/02/20 1000   02/06/2020 2300  remdesivir 200 mg in sodium chloride 0.9% 250 mL IVPB     200 mg 580 mL/hr over 30 Minutes Intravenous Once 02/16/2020 2258 01/30/20 0102   02/13/2020 1929  vancomycin variable dose per unstable renal function (pharmacist dosing)  Status:  Discontinued      Does not apply See admin instructions 02/11/2020 1929 01/30/20 1429   02/01/2020 1730   ceFEPIme (  MAXIPIME) 2 g in sodium chloride 0.9 % 100 mL IVPB     2 g 200 mL/hr over 30 Minutes Intravenous  Once 02/16/2020 1716 02/14/2020 1935   01/27/2020 1730  metroNIDAZOLE (FLAGYL) IVPB 500 mg     500 mg 100 mL/hr over 60 Minutes Intravenous  Once 01/26/2020 1716 02/14/2020 2000   02/11/2020 1730  vancomycin (VANCOCIN) IVPB 1000 mg/200 mL premix     1,000 mg 200 mL/hr over 60 Minutes Intravenous  Once 02/13/2020 1716 02/09/2020 2005      Inpatient Medications  Scheduled Meds: . aspirin EC  81 mg Oral QHS  . dexamethasone (DECADRON) injection  6 mg Intravenous Q24H  . heparin  5,000 Units Subcutaneous Q8H  . insulin aspart  0-15 Units Subcutaneous TID WC  . insulin aspart  0-5 Units Subcutaneous QHS  . insulin glargine  10 Units Subcutaneous BID  . pantoprazole (PROTONIX) IV  40 mg Intravenous Q12H  . sodium zirconium cyclosilicate  10 g Oral Daily  . sucralfate  1 g Oral TID WC & HS   Continuous Infusions: . sodium chloride 75 mL/hr at 02/21/2020 0640  . fluconazole (DIFLUCAN) IV 100 mg (02/02/2020 1226)   PRN Meds:.   Time Spent in minutes  25  See all Orders from today for further details   Oren Binet M.D on 02/21/2020 at 1:33 PM  To page go to [email or web address] - use universal password  [facility] -  Office  [phone]    Objective:   Vitals:   02/04/20 1600 02/04/20 2113 01/31/2020 0400 01/30/2020 1314  BP: (!) 118/54 (!) 115/50 (!) 130/57 (!) 116/38  Pulse: 90 94 84   Resp: 16 16  14   Temp: 98.3 F (36.8 C) 97.8 F (36.6 C)  97.6 F (36.4 C)  TempSrc: Axillary Oral  Oral  SpO2: 99% 96% 100%   Weight:      Height:        Wt Readings from Last 3 Encounters:  02/04/20 74.9 kg  04/12/14 81.6 kg  11/12/13 81.6 kg     Intake/Output Summary (Last 24 hours) at 01/28/2020 1333 Last data filed at 01/28/2020 0850 Gross per 24 hour  Intake 1061.13 ml  Output 975 ml  Net 86.13 ml     Physical Exam Gen Exam:Alert awake-not in any distress HEENT:atraumatic,  normocephalic Chest: B/L clear to auscultation anteriorly CVS:S1S2 regular Abdomen:soft non tender, non distended Extremities:no edema Neurology: Non focal Skin: no rash   Data Review:    CBC Recent Labs  Lab 02/20/2020 1800 01/25/2020 1900 02/02/2020 2300 02/01/20 0410 02/03/20 0431 02/04/20 0437 02/10/2020 0305  WBC 12.7*   < > 10.4 10.3 11.3* 12.5* 17.7*  HGB 14.1   < > 11.4* 11.9* 11.4* 9.7* 8.2*  HCT 45.0   < > 34.1* 36.1* 34.5* 29.4* 25.6*  PLT 369   < > 271 348 364 291 357  MCV 88.1   < > 83.2 85.5 83.9 84.5 86.2  MCH 27.6   < > 27.8 28.2 27.7 27.9 27.6  MCHC 31.3   < > 33.4 33.0 33.0 33.0 32.0  RDW 14.0   < > 13.7 14.6 14.4 14.6 14.9  LYMPHSABS 0.4*  --   --   --   --   --   --   MONOABS 0.6  --   --   --   --   --   --   EOSABS 0.0  --   --   --   --   --   --  BASOSABS 0.0  --   --   --   --   --   --    < > = values in this interval not displayed.    Chemistries  Recent Labs  Lab 01/28/2020 2300 01/30/20 0149 02/01/20 0410 02/02/20 0437 02/03/20 0431 02/04/20 0437 01/27/2020 0305  NA 140   < > 143 141 139 140 142  K 4.7   < > 5.4* 4.6 4.2 5.1 5.2*  CL 107   < > 109 106 107 108 111  CO2 20*   < > 24 23 22  21* 21*  GLUCOSE 438*   < > 215* 90 89 66* 66*  BUN 66*   < > 57* 55* 57* 75* 84*  CREATININE 3.17*   < > 2.21* 1.99* 1.99* 2.22* 2.35*  CALCIUM 7.9*   < > 8.5* 8.3* 8.2* 8.1* 7.9*  MG 2.5*  --   --   --   --   --   --   AST  --    < > 40 34 38 38 32  ALT  --    < > 26 26 27 23 21   ALKPHOS  --    < > 58 56 60 53 50  BILITOT  --    < > 0.6 0.7 0.4 0.5 0.5   < > = values in this interval not displayed.   ------------------------------------------------------------------------------------------------------------------ No results for input(s): CHOL, HDL, LDLCALC, TRIG, CHOLHDL, LDLDIRECT in the last 72 hours.  Lab Results  Component Value Date   HGBA1C 9.4 (H) 07/31/2012    ------------------------------------------------------------------------------------------------------------------ No results for input(s): TSH, T4TOTAL, T3FREE, THYROIDAB in the last 72 hours.  Invalid input(s): FREET3 ------------------------------------------------------------------------------------------------------------------ Recent Labs    02/04/20 0437 02/02/2020 0305  FERRITIN 519* 506*    Coagulation profile Recent Labs  Lab 02/03/2020 1800  INR 1.2    Recent Labs    02/04/20 0437 02/04/2020 0305  DDIMER 1.23* 1.11*    Cardiac Enzymes No results for input(s): CKMB, TROPONINI, MYOGLOBIN in the last 168 hours.  Invalid input(s): CK ------------------------------------------------------------------------------------------------------------------ No results found for: BNP  Micro Results Recent Results (from the past 240 hour(s))  MRSA PCR Screening     Status: None   Collection Time: 01/26/2020 12:49 AM   Specimen: Nasopharyngeal  Result Value Ref Range Status   MRSA by PCR NEGATIVE NEGATIVE Final    Comment:        The GeneXpert MRSA Assay (FDA approved for NASAL specimens only), is one component of a comprehensive MRSA colonization surveillance program. It is not intended to diagnose MRSA infection nor to guide or monitor treatment for MRSA infections. Performed at [facility], [place] [address], [place], [place] [address]   Culture, blood (routine x 2)     Status: None   Collection Time: 02/06/2020  5:03 PM   Specimen: BLOOD  Result Value Ref Range Status   Specimen Description BLOOD RIGHT ANTECUBITAL  Final   Special Requests   Final    BOTTLES DRAWN AEROBIC AND ANAEROBIC Blood Culture adequate volume   Culture   Final    NO GROWTH 5 DAYS Performed at [facility], [place] [address], [place], [place] [address]    Report Status 02/03/2020 FINAL  Final  Culture, blood (routine x 2)     Status: None   Collection Time: 02/03/2020  5:08 PM    Specimen: BLOOD LEFT HAND  Result Value Ref Range Status   Specimen Description BLOOD LEFT  HAND  Final   Special Requests   Final    BOTTLES DRAWN AEROBIC ONLY Blood Culture results may not be optimal due to an inadequate volume of blood received in culture bottles   Culture   Final    NO GROWTH 5 DAYS Performed at [facility], [place] [address], [place], [place] [address]    Report Status 02/03/2020 FINAL  Final  Respiratory Panel by RT PCR (Flu A&B, Covid) - Nasopharyngeal Swab     Status: Abnormal   Collection Time: 02/13/2020  6:22 PM   Specimen: Nasopharyngeal Swab  Result Value Ref Range Status   SARS Coronavirus 2 by RT PCR POSITIVE (A) NEGATIVE Final    Comment: RESULT CALLED TO, READ BACK BY AND VERIFIED WITH: [place] 02/04/2020 A BROWNING (NOTE) SARS-CoV-2 target nucleic acids are DETECTED. SARS-CoV-2 RNA is generally detectable in upper respiratory specimens  during the acute phase of infection. Positive results are indicative of the presence of the identified virus, but do not rule out bacterial infection or co-infection with other pathogens not detected by the test. Clinical correlation with patient history and other diagnostic information is necessary to determine patient infection status. The expected result is Negative. Fact Sheet for Patients:  PinkCheek.be Fact Sheet for Healthcare Providers: GravelBags.it This test is not yet approved or cleared by the [place] FDA and  has been authorized for detection and/or diagnosis of SARS-CoV-2 by FDA under an Emergency Use Authorization (EUA).  This EUA will remain in effect (meaning this test can be used) f or the duration of  the COVID-19 declaration under Section 564(b)(1) of the Act, 21 U.S.C. section 360bbb-3(b)(1), unless the authorization is terminated or revoked sooner.    Influenza A by PCR NEGATIVE NEGATIVE Final   Influenza B by PCR  NEGATIVE NEGATIVE Final    Comment: (NOTE) The Xpert Xpress SARS-CoV-2/FLU/RSV assay is intended as an aid in  the diagnosis of influenza from Nasopharyngeal swab specimens and  should not be used as a sole basis for treatment. Nasal washings and  aspirates are unacceptable for Xpert Xpress SARS-CoV-2/FLU/RSV  testing. Fact Sheet for Patients: PinkCheek.be Fact Sheet for Healthcare Providers: GravelBags.it This test is not yet approved or cleared by the [place] FDA and  has been authorized for detection and/or diagnosis of SARS-CoV-2 by  FDA under an Emergency Use Authorization (EUA). This EUA will remain  in effect (meaning this test can be used) for the duration of the  Covid-19 declaration under Section 564(b)(1) of the Act, 21  U.S.C. section 360bbb-3(b)(1), unless the authorization is  terminated or revoked. Performed at [facility], [place] [address], [place], [place] [address]   Urine culture     Status: Abnormal   Collection Time: 02/10/2020  8:06 PM   Specimen: In/Out Cath Urine  Result Value Ref Range Status   Specimen Description IN/OUT CATH URINE  Final   Special Requests NONE  Final   Culture (A)  Final    >=100,000 COLONIES/mL KLEBSIELLA PNEUMONIAE 80,000 COLONIES/mL STREPTOCOCCUS AGALACTIAE TESTING AGAINST S. AGALACTIAE NOT ROUTINELY PERFORMED DUE TO PREDICTABILITY OF AMP/PEN/VAN SUSCEPTIBILITY. Performed at [facility], [place] [address], [place], [place] [address]    Report Status 02/01/2020 FINAL  Final   Organism ID, Bacteria KLEBSIELLA PNEUMONIAE (A)  Final      Susceptibility   Klebsiella pneumoniae - MIC*    AMPICILLIN >=32 RESISTANT Resistant     CEFAZOLIN <=4 SENSITIVE Sensitive  CEFTRIAXONE 0.5 SENSITIVE Sensitive     CIPROFLOXACIN <=0.25 SENSITIVE Sensitive     GENTAMICIN <=1 SENSITIVE Sensitive     IMIPENEM <=0.25 SENSITIVE Sensitive     NITROFURANTOIN 32 SENSITIVE Sensitive       TRIMETH/SULFA <=20 SENSITIVE Sensitive     AMPICILLIN/SULBACTAM 8 SENSITIVE Sensitive     PIP/TAZO 8 SENSITIVE Sensitive     * >=100,000 COLONIES/mL KLEBSIELLA PNEUMONIAE    Radiology Reports CT Head Wo Contrast  Result Date: 02/10/2020 CLINICAL DATA:  Altered mental status EXAM: CT HEAD WITHOUT CONTRAST TECHNIQUE: Contiguous axial images were obtained from the base of the skull through the vertex without intravenous contrast. COMPARISON:  12/23/2008 FINDINGS: Brain: No evidence of acute infarction, hemorrhage, hydrocephalus, extra-axial collection or mass lesion/mass effect. There is a right frontal approach VP shunt with tip terminating in the right lateral ventricle. There is encephalomalacia along the course of the VP shunt which has slightly worsened since the prior study. There is atrophy and chronic microvascular ischemic changes. There is a persistent 1 cm density at the left frontal horn periventricular white matter, stable from prior study. Vascular: No hyperdense vessel or unexpected calcification. Skull: Normal. Negative for fracture or focal lesion. Sinuses/Orbits: There is mucosal thickening of the maxillary sinuses and ethmoid air cells bilaterally. The remaining paranasal sinuses and mastoid air cells are essentially clear. Other: None. IMPRESSION: 1. No acute intracranial abnormality. 2. Chronic microvascular ischemic changes and atrophy which has progressed since prior study. 3. Well-positioned VP shunt. Electronically Signed   By: Constance Holster M.D.   On: 02/03/2020 19:32   DG Chest Portable 1 View  Result Date: 02/14/2020 CLINICAL DATA:  Altered mental status EXAM: PORTABLE CHEST 1 VIEW COMPARISON:  09/08/2014 FINDINGS: VP shunt traversing the right chest, continuous where seen. Prominent markings at the bases. No effusion, edema, or pneumothorax. Normal heart size and mediastinal contours. IMPRESSION: Indistinct opacity at the lung bases could be atelectasis (lung volumes are  low) or infectious infiltrates. Electronically Signed   By: Monte Fantasia M.D.   On: 02/05/2020 17:47   CT RENAL STONE STUDY  Result Date: 01/30/2020 CLINICAL DATA:  Pyelonephritis. EXAM: CT ABDOMEN AND PELVIS WITHOUT CONTRAST TECHNIQUE: Multidetector CT imaging of the abdomen and pelvis was performed following the standard protocol without IV contrast. COMPARISON:  April 06, 2014. FINDINGS: Lower chest: Multiple airspace opacities are noted in the visualized lung bases concerning for multifocal pneumonia. Hepatobiliary: No focal liver abnormality is seen. No gallstones, gallbladder wall thickening, or biliary dilatation. Pancreas: Unremarkable. No pancreatic ductal dilatation or surrounding inflammatory changes. Spleen: Normal in size without focal abnormality. Adrenals/Urinary Tract: Adrenal glands are unremarkable. Kidneys are normal, without renal calculi, focal lesion, or hydronephrosis. Bladder is unremarkable. Stomach/Bowel: Stomach is within normal limits. Appendix appears normal. No evidence of bowel wall thickening, distention, or inflammatory changes. Vascular/Lymphatic: Aortic atherosclerosis. No enlarged abdominal or pelvic lymph nodes. Reproductive: Prostate is unremarkable. Other: No abdominal wall hernia or abnormality. No abdominopelvic ascites. There is again noted right-sided ventriculoperitoneal shunt with distal tip in the right lower quadrant. Musculoskeletal: No acute or significant osseous findings. IMPRESSION: 1. Multiple airspace opacities are noted in the visualized lung bases concerning for multifocal pneumonia. 2. Aortic atherosclerosis. 3. No acute abnormality seen in the abdomen or pelvis. Aortic Atherosclerosis (ICD10-I70.0). Electronically Signed   By: Marijo Conception M.D.   On: 01/30/2020 10:48   ECHOCARDIOGRAM LIMITED  Result Date: 01/31/2020   ECHOCARDIOGRAM REPORT   Patient Name:   MICKAEL NAPOLES Date  of Exam: 01/31/2020 Medical Rec #:  [id number]      Height:       70.0 in  Accession #:    [id number]     Weight:       159.8 lb Date of Birth:  28-Jan-1941      BSA:          1.90 m Patient Age:    80 years       BP:           147/68 mmHg Patient Gender: M              HR:           95 bpm. Exam Location:  Inpatient Procedure: Limited Echo Indications:    Fever 780.6/R50.9  History:        Patient has no prior history of Echocardiogram examinations.                 Risk Factors:Hypertension, Dyslipidemia and Diabetes.  Sonographer:    Clayton Lefort RDCS (AE) Referring Phys: [id number] [place]  1. Left ventricular ejection fraction, by visual estimation, is 60 to 65%. The left ventricle has normal function. There is severely increased left ventricular hypertrophy of the basal septum.  2. Left ventricular diastolic parameters are consistent with Grade I diastolic dysfunction (impaired relaxation).  3. Elevated left ventricular end-diastolic pressure.  4. Global right ventricle has normal systolic function.The right ventricular size is normal. No increase in right ventricular wall thickness.  5. Left atrial size was normal.  6. Right atrial size was normal.  7. Mild to moderate mitral annular calcification.  8. The mitral valve is normal in structure. No evidence of mitral valve regurgitation. No evidence of mitral stenosis.  9. The tricuspid valve is normal in structure. Tricuspid valve regurgitation is not demonstrated. 10. The aortic valve is tricuspid. Aortic valve regurgitation is not visualized. Mild aortic valve sclerosis without stenosis. 11. The pulmonic valve was normal in structure. Pulmonic valve regurgitation is not visualized. 12. The inferior vena cava is normal in size with greater than 50% respiratory variability, suggesting right atrial pressure of 3 mmHg. 13. TR signal is inadequate for assessing pulmonary artery systolic pressure. 14. Poor acoustical windows limit ability to adequately assess for vegetation. No obvious massess on valve. Recommend TEE if  clinically indicated. FINDINGS  Left Ventricle: Left ventricular ejection fraction, by visual estimation, is 60 to 65%. The left ventricle has normal function. The left ventricle is not well visualized. There is severely increased left ventricular hypertrophy of the basal septum. Left  ventricular diastolic parameters are consistent with Grade I diastolic dysfunction (impaired relaxation). Elevated left ventricular end-diastolic pressure. Right Ventricle: The right ventricular size is normal. No increase in right ventricular wall thickness. Global RV systolic function is has normal systolic function. Left Atrium: Left atrial size was normal in size. Right Atrium: Right atrial size was normal in size Pericardium: There is no evidence of pericardial effusion. Mitral Valve: The mitral valve is normal in structure. Mild to moderate mitral annular calcification. No evidence of mitral valve regurgitation. No evidence of mitral valve stenosis by observation. Tricuspid Valve: The tricuspid valve is normal in structure. Tricuspid valve regurgitation is not demonstrated. Aortic Valve: The aortic valve is tricuspid. Aortic valve regurgitation is not visualized. Mild aortic valve sclerosis is present, with no evidence of aortic valve stenosis. Pulmonic Valve: The pulmonic valve was normal in structure. Pulmonic valve regurgitation is not visualized. Pulmonic regurgitation is not  visualized. Aorta: The aortic root, ascending aorta and aortic arch are all structurally normal, with no evidence of dilitation or obstruction. Venous: The inferior vena cava is normal in size with greater than 50% respiratory variability, suggesting right atrial pressure of 3 mmHg. IAS/Shunts: No atrial level shunt detected by color flow Doppler. There is no evidence of a patent foramen ovale. No ventricular septal defect is seen or detected. There is no evidence of an atrial septal defect.  LEFT VENTRICLE PLAX 2D LVIDd:         3.70 cm  Diastology  LVIDs:         2.30 cm  LV e' lateral:   5.33 cm/s LV PW:         1.30 cm  LV E/e' lateral: 14.5 LV IVS:        1.60 cm  LV e' medial:    6.09 cm/s LVOT diam:     2.00 cm  LV E/e' medial:  12.7 LV SV:         40 ml LV SV Index:   21.12 LVOT Area:     3.14 cm  LEFT ATRIUM         Index LA diam:    2.70 cm 1.42 cm/m   AORTA Ao Root diam: 2.80 cm MITRAL VALVE MV Area (PHT): 7.99 cm              SHUNTS MV PHT:        27.55 msec            Systemic Diam: 2.00 cm MV Decel Time: 95 msec MV E velocity: 77.10 cm/s  103 cm/s MV A velocity: 118.00 cm/s 70.3 cm/s MV E/A ratio:  0.65        1.5  Fransico Him MD Electronically signed by Fransico Him MD Signature Date/Time: 01/31/2020/11:57:58 AM    Final    DG ESOPHAGUS W SINGLE CM (SOL OR THIN BA)  Result Date: 02/01/2020 CLINICAL DATA:  Mid chest pain and heartburn. Recent bedside speech pathology evaluation. COVID-19 infection. EXAM: SINGLE COLUMN BARIUM ENEMA TECHNIQUE: Initial scout AP supine abdominal image obtained to insure adequate colon cleansing. Barium was introduced into the colon in a retrograde fashion and refluxed from the rectum to the cecum. Spot images of the colon followed by overhead radiographs were obtained. FLUOROSCOPY TIME:  Fluoroscopy Time: 1 minutes and 0 seconds of low-dose pulsed fluoroscopy Radiation Exposure Index (if provided by the fluoroscopic device): 8.7 mGy Number of Acquired Spot Images: 0 COMPARISON:  Chest radiographs 01/25/2020. Abdominal CT 01/30/2020. FINDINGS: The study was performed in the supine and right lateral decubitus positions. The esophageal motility appears normal. No laryngeal penetration or abnormality of the cervical esophagus was demonstrated. There is mild smooth narrowing of the distal esophageal lumen without mucosal ulceration. A 13 mm barium tablet was administered and was temporarily delayed in the cervical esophagus. This subsequently passed into the distal esophagus. Despite drinking water and thin barium,  this tablet did not pass into the stomach during approximately 3 minutes of intermittent fluoroscopic observation with the patient supine and semi erect. IMPRESSION: 1. Mild smooth narrowing of the distal esophageal lumen, likely due to chronic reflux. No evidence of mucosal ulceration. 2. Barium tablet did not pass into the stomach during approximately 3 minutes of intermittent observation. Electronically Signed   By: Richardean Sale M.D.   On: 02/01/2020 14:25

## 2020-02-05 NOTE — Anesthesia Procedure Notes (Signed)
Procedure Name: MAC Date/Time: 02/18/2020 3:27 PM Performed by: Mariea Clonts, CRNA Pre-anesthesia Checklist: Patient identified, Emergency Drugs available, Suction available, Patient being monitored and Timeout performed Patient Re-evaluated:Patient Re-evaluated prior to induction Oxygen Delivery Method: Simple face mask and Nasal cannula

## 2020-02-05 NOTE — Op Note (Signed)
[facility] Patient Name: Bob Vazquez Procedure Date : 02/11/2020 MRN: [id number] Attending MD: Carol Ada , MD Date of Birth: Jul 15, 1941 CSN: [id number] Age: 79 Admit Type: Inpatient Procedure:                Upper GI endoscopy Indications:              Odynophagia Providers:                Carol Ada, MD, Baird Cancer, RN, William Dalton, Technician Referring MD:              Medicines:                Propofol per Anesthesia Complications:            No immediate complications. Estimated Blood Loss:     Estimated blood loss: none. Procedure:                Pre-Anesthesia Assessment:                           - Prior to the procedure, a History and Physical                            was performed, and patient medications and                            allergies were reviewed. The patient's tolerance of                            previous anesthesia was also reviewed. The risks                            and benefits of the procedure and the sedation                            options and risks were discussed with the patient.                            All questions were answered, and informed consent                            was obtained. Prior Anticoagulants: The patient has                            taken no previous anticoagulant or antiplatelet                            agents. ASA Grade Assessment: III - A patient with                            severe systemic disease. After reviewing the risks                            and  benefits, the patient was deemed in                            satisfactory condition to undergo the procedure.                           - Sedation was administered by an anesthesia                            professional. Deep sedation was attained.                           After obtaining informed consent, the endoscope was                            passed under direct vision. Throughout the                       procedure, the patient's blood pressure, pulse, and                            oxygen saturations were monitored continuously. The                            [id number]) Olympus gastroscope was                            introduced through the mouth, and advanced to the                            second part of duodenum. The upper GI endoscopy was                            technically difficult and complex. The patient                            tolerated the procedure well. Findings:      Severe esophagitis with no bleeding was found in the entire esophagus.       Biopsies were taken with a cold forceps for histology.      One spurting cratered gastric ulcer with a visible vessel was found at       the pylorus. The lesion was 15 mm in largest dimension. Area was       successfully injected with 4 mL of a 1:10,000 solution of epinephrine       for hemostasis. Fulguration to ablate the lesion by bipolar probe was       successful. Estimated blood loss: none.      One non-bleeding cratered duodenal ulcer with adherent clot was found in       the duodenal bulb. The lesion was 10 mm in largest dimension. For       hemostasis, four hemostatic clips were successfully placed (MR unsafe).       There was no bleeding at the end of the procedure.      The esophageal mucosa was abnormal. The etiology of this abnormality is       unknown. There  was no evidence of a Candidal esophagitis, CMV, or HSV       esophagitis. The findings were not overtly suggestive of reflux       esophagitis. Cold biopsies were obtained. An incidental finding of a       pyloric channel ulcer was noted. There was some evidence of bleeding and       then closer inspection of the area revealed a visible vessel, which       started to mildly spurt when gastric fluid was suctioned around the       area. There was also an attached clot that was removed with a cold       snare. Several passes with a  BICAP probe were used to ablate the vessel.       Ablation was difficult as the patient continued to cough throughout the       entire procedure. Applying consistent pressure to the are was difficult.       The area was also treated wtih Epi 1:10,000 for a total of 4 ml, which       was applied before the application of BICAP. A secondary duodenal bulb       ulcer was found and there was a superficial adherent clot. Attempts to       remove the clot failed. Four hemoclips were placed on the ulcer to       reduce the risk of bleeding. Impression:               - Severe esophagitis with no bleeding. Biopsied.                           - Spurting gastric ulcer with a visible vessel.                            Injected. Treated with bipolar cautery.                           - Non-bleeding duodenal ulcer with adherent clot.                            Clips (MR unsafe) were placed. Recommendation:           - Return patient to hospital ward for ongoing care.                           - Resume regular diet.                           - Continue present medications.                           - Await pathology results. Procedure Code(s):        --- Professional ---                           [id number], 51, Esophagogastroduodenoscopy, flexible,                            transoral; with control of bleeding, any method  [id number], Esophagogastroduodenoscopy, flexible,                            transoral; with biopsy, single or multiple Diagnosis Code(s):        --- Professional ---                           K20.90, Esophagitis, unspecified without bleeding                           K25.4, Chronic or unspecified gastric ulcer with                            hemorrhage                           K26.4, Chronic or unspecified duodenal ulcer with                            hemorrhage                           R13.10, Dysphagia, unspecified CPT copyright 2019 American Medical  Association. All rights reserved. The codes documented in this report are preliminary and upon coder review may  be revised to meet current compliance requirements. Carol Ada, MD Carol Ada, MD 02/13/2020 4:25:48 PM This report has been signed electronically. Number of Addenda: 0

## 2020-02-06 DIAGNOSIS — D62 Acute posthemorrhagic anemia: Secondary | ICD-10-CM

## 2020-02-06 DIAGNOSIS — K264 Chronic or unspecified duodenal ulcer with hemorrhage: Secondary | ICD-10-CM

## 2020-02-06 DIAGNOSIS — K921 Melena: Secondary | ICD-10-CM

## 2020-02-06 DIAGNOSIS — U071 COVID-19: Secondary | ICD-10-CM

## 2020-02-06 DIAGNOSIS — K922 Gastrointestinal hemorrhage, unspecified: Secondary | ICD-10-CM

## 2020-02-06 DIAGNOSIS — K209 Esophagitis, unspecified without bleeding: Secondary | ICD-10-CM

## 2020-02-06 DIAGNOSIS — K254 Chronic or unspecified gastric ulcer with hemorrhage: Secondary | ICD-10-CM

## 2020-02-06 LAB — COMPREHENSIVE METABOLIC PANEL
ALT: 20 U/L (ref 0–44)
AST: 26 U/L (ref 15–41)
Albumin: 1.7 g/dL — ABNORMAL LOW (ref 3.5–5.0)
Alkaline Phosphatase: 36 U/L — ABNORMAL LOW (ref 38–126)
Anion gap: 7 (ref 5–15)
BUN: 86 mg/dL — ABNORMAL HIGH (ref 8–23)
CO2: 15 mmol/L — ABNORMAL LOW (ref 22–32)
Calcium: 7.1 mg/dL — ABNORMAL LOW (ref 8.9–10.3)
Chloride: 116 mmol/L — ABNORMAL HIGH (ref 98–111)
Creatinine, Ser: 2.6 mg/dL — ABNORMAL HIGH (ref 0.61–1.24)
GFR calc Af Amer: 26 mL/min — ABNORMAL LOW (ref >60)
GFR calc non Af Amer: 22 mL/min — ABNORMAL LOW (ref >60)
Glucose, Bld: 121 mg/dL — ABNORMAL HIGH (ref 70–99)
Potassium: 5.5 mmol/L — ABNORMAL HIGH (ref 3.5–5.1)
Sodium: 138 mmol/L (ref 135–145)
Total Bilirubin: 0.3 mg/dL (ref 0.3–1.2)
Total Protein: 4.9 g/dL — ABNORMAL LOW (ref 6.5–8.1)

## 2020-02-06 LAB — CBC
HCT: 19 % — ABNORMAL LOW (ref 39.0–52.0)
Hemoglobin: 6.1 g/dL — CL (ref 13.0–17.0)
MCH: 28.1 pg (ref 26.0–34.0)
MCHC: 32.1 g/dL (ref 30.0–36.0)
MCV: 87.6 fL (ref 80.0–100.0)
Platelets: 285 10*3/uL (ref 150–400)
RBC: 2.17 MIL/uL — ABNORMAL LOW (ref 4.22–5.81)
RDW: 15.7 % — ABNORMAL HIGH (ref 11.5–15.5)
WBC: 16.2 10*3/uL — ABNORMAL HIGH (ref 4.0–10.5)
nRBC: 4.1 % — ABNORMAL HIGH (ref 0.0–0.2)

## 2020-02-06 LAB — GLUCOSE, CAPILLARY
Glucose-Capillary: 102 mg/dL — ABNORMAL HIGH (ref 70–99)
Glucose-Capillary: 118 mg/dL — ABNORMAL HIGH (ref 70–99)
Glucose-Capillary: 117 mg/dL — ABNORMAL HIGH (ref 70–99)
Glucose-Capillary: 106 mg/dL — ABNORMAL HIGH (ref 70–99)

## 2020-02-06 LAB — D-DIMER, QUANTITATIVE: D-Dimer, Quant: 1.27 ug/mL-FEU — ABNORMAL HIGH (ref 0.00–0.50)

## 2020-02-06 LAB — PREPARE RBC (CROSSMATCH)

## 2020-02-06 LAB — ABO/RH: ABO/RH(D): O POS

## 2020-02-06 LAB — C-REACTIVE PROTEIN: CRP: 6 mg/dL — ABNORMAL HIGH (ref <1.0)

## 2020-02-06 LAB — FERRITIN: Ferritin: 296 ng/mL (ref 24–336)

## 2020-02-06 MED ORDER — SODIUM CHLORIDE 0.9 % IV SOLN
8.0000 mg/h | INTRAVENOUS | Status: AC
  Administered 2020-02-06 – 2020-02-08 (×6): 8 mg/h via INTRAVENOUS
  Filled 2020-02-06 (×8): qty 80

## 2020-02-06 MED ORDER — ACETAMINOPHEN 325 MG PO TABS
650.0000 mg | ORAL_TABLET | Freq: Once | ORAL | Status: AC
  Administered 2020-02-06: 15:00:00 650 mg via ORAL
  Filled 2020-02-06: qty 2

## 2020-02-06 MED ORDER — DEXTROSE 50 % IV SOLN
50.0000 mL | Freq: Once | INTRAVENOUS | Status: AC
  Administered 2020-02-06: 50 mL via INTRAVENOUS
  Filled 2020-02-06: qty 50

## 2020-02-06 MED ORDER — DIPHENHYDRAMINE HCL 50 MG/ML IJ SOLN
25.0000 mg | Freq: Once | INTRAMUSCULAR | Status: AC
  Administered 2020-02-06: 25 mg via INTRAVENOUS
  Filled 2020-02-06: qty 1

## 2020-02-06 MED ORDER — PANTOPRAZOLE SODIUM 40 MG IV SOLR: 40.0000 mg | Freq: Two times a day (BID) | INTRAVENOUS | Status: DC

## 2020-02-06 MED ORDER — SODIUM CHLORIDE 0.9 % IV SOLN
80.0000 mg | Freq: Once | INTRAVENOUS | Status: AC
  Administered 2020-02-06: 80 mg via INTRAVENOUS
  Filled 2020-02-06: qty 80

## 2020-02-06 MED ORDER — SODIUM CHLORIDE 0.9% IV SOLUTION: Freq: Once | INTRAVENOUS | Status: AC

## 2020-02-06 MED ORDER — INFLUENZA VAC A&B SA ADJ QUAD 0.5 ML IM PRSY
0.5000 mL | PREFILLED_SYRINGE | INTRAMUSCULAR | Status: AC
  Administered 2020-02-07: 0.5 mL via INTRAMUSCULAR
  Filled 2020-02-06: qty 0.5

## 2020-02-06 MED ORDER — INSULIN ASPART 100 UNIT/ML IV SOLN
6.0000 [IU] | Freq: Once | INTRAVENOUS | Status: AC
  Administered 2020-02-06: 6 [IU] via INTRAVENOUS

## 2020-02-06 MED ORDER — SODIUM CHLORIDE 0.9 % IV SOLN: INTRAVENOUS | Status: DC

## 2020-02-06 MED ORDER — INSULIN GLARGINE 100 UNIT/ML ~~LOC~~ SOLN
10.0000 [IU] | Freq: Every day | SUBCUTANEOUS | Status: DC
  Filled 2020-02-06: qty 0.1

## 2020-02-06 NOTE — Progress Notes (Signed)
Initial Nutrition Assessment  RD working remotely.  DOCUMENTATION CODES:   Not applicable  INTERVENTION:  Once diet able to be advanced provide Nepro Shake po TID, each supplement provides 425 kcal and 19 grams protein.  If diet unable to advance consider initiation of nutrition support (tube feeds vs TPN pending GI findings) if this aligns with patient's goals of care as he has had poor PO intake during admission. Even if diet is able to advance may need to consider nutrition support in setting of poor PO intake and increased calorie/protein needs due to catabolic nature of [id number].  NUTRITION DIAGNOSIS:   Increased nutrient needs related to catabolic [id number]) as evidenced by estimated needs.  GOAL:   Patient will meet greater than or equal to 90% of their needs  MONITOR:   Diet advancement, PO intake, Supplement acceptance, Labs, Weight trends, I & O's  REASON FOR ASSESSMENT:   Malnutrition Screening Tool    ASSESSMENT:   79 year old male with PMHx of HTN, HLD, chronic bronchitis, DM type 2 admitted withacute metabolic encephalopathy in the setting of severe sepsis due to UTI, DKA and COVID-19 pneumonia, also with odynophagia due to severe erosive esophagitis.   -Barium esophagogram on 2/8 showed some mild smooth narrowing in the distal esophagus.   -EGD completed on 2/12-showed severe esophagitis and gastric ulcer with visible vessel/and a nonbleeding duodenal ulcer with an adherent clot.  Attempted to call patient over the phone for nutrition/weight history but he was unable to answer. According to chart patient has had poor PO intake this admission (0-50%). After his EGD yesterday diet was advanced but patient has now been made NPO again. He had a drop in Hgb and is receiving 2 units of PRBC today. He may require repeat EGD tomorrow pending trend in Hgb.   Prior to current admission the last weight in chart was 81.6 kg from 04/12/2014. During this admission weight  has fluctuated between 62.3-77.4 kg.  Medications reviewed and include: Novolog 0-15 units TID, Novolog 0-5 units QHS, Lantus 10 units daily, pantoprzole, Lokelma 10 grams daily, Carafate 1 gram TID and QHS, NS at 40 mL/hr, Protonix.  Labs reviewed: CBG 102-118, Potassium 5.5, Chloride 116, CO2 15, BUN 86, Creatinine 2.6.  Unable to determine if patient meets criteria for malnutrition at this time.  NUTRITION - FOCUSED PHYSICAL EXAM:  Unable to complete at this time.  Diet Order:   Diet Order            Diet NPO time specified  Diet effective now             EDUCATION NEEDS:   No education needs have been identified at this time  Skin:  Skin Assessment: Reviewed RN Assessment  Last BM:  02/03/2020 per chart  Height:   Ht Readings from Last 1 Encounters:  01/30/20 5\' 10"  (1.778 m)   Weight:   Wt Readings from Last 1 Encounters:  02/06/20 77.4 kg   Ideal Body Weight:  75.5 kg  BMI:  Body mass index is 24.48 kg/m.  Estimated Nutritional Needs:   Kcal:  2000-2200  Protein:  100-110 grams  Fluid:  2 L/day  Jacklynn Barnacle, MS, RD, LDN Pager number available on Amion

## 2020-02-06 NOTE — Progress Notes (Signed)
[facility] Progress Note Covering weekend for Dr. Benson Norway Chief Complaint: Gastric and duodenal ulcers with active bleeding yesterday  History:  Signout received from Dr. Benson Norway last evening, yesterday's endoscopy report personally reviewed.  I got a call from Dr. Sloan Leiter of the hospitalist service today with concerns for GI bleeding.  Patient apparently had passage of melena either overnight or early this morning.  This morning hemoglobin is noted to be 6.1, down from 8.2  24 hours prior. Because this patient is Covid positive, and in an effort to reduce staff exposure, I did go on to the patient's medical ward in proper PPE, observed him through the window to his room, reviewed the cardiac monitor and spoke with his nurse. His blood pressure is 100/70, pulse in the 80s. ROS: Unable to review since no contact visit with patient.  Objective:   Current Facility-Administered Medications:  .  0.9 %  sodium chloride infusion (Manually program via Guardrails IV Fluids), , Intravenous, Once, Ghimire, [employer], MD .  0.9 %  sodium chloride infusion, , Intravenous, Continuous, Ghimire, Henreitta Leber, MD .  acetaminophen (TYLENOL) tablet 650 mg, 650 mg, Oral, Q6H PRN, Carol Ada, MD, 650 mg at 02/01/2020 2233 .  acetaminophen (TYLENOL) tablet 650 mg, 650 mg, Oral, Once, Ghimire, Shanker M, MD .  albuterol (VENTOLIN HFA) 108 (90 Base) MCG/ACT inhaler 2 puff, 2 puff, Inhalation, Q4H PRN, Carol Ada, MD .  benzonatate (TESSALON) capsule 200 mg, 200 mg, Oral, TID PRN, Carol Ada, MD, 200 mg at 02/03/20 1524 .  diphenhydrAMINE (BENADRYL) injection 25 mg, 25 mg, Intravenous, Once, Ghimire, Henreitta Leber, MD .  Derrill Memo ON 02/07/2020] influenza vaccine adjuvanted (FLUAD) injection 0.5 mL, 0.5 mL, Intramuscular, Tomorrow-1000, Ghimire, Shanker M, MD .  insulin aspart (novoLOG) injection 0-15 Units, 0-15 Units, Subcutaneous, TID WC, Carol Ada, MD, 2 Units at 02/07/2020 [phone] .  insulin aspart (novoLOG) injection 0-5  Units, 0-5 Units, Subcutaneous, QHS, Carol Ada, MD .  Derrill Memo ON 02/07/2020] insulin glargine (LANTUS) injection 10 Units, 10 Units, Subcutaneous, Daily, Ghimire, Henreitta Leber, MD .  ondansetron [facility]) injection 4 mg, 4 mg, Intravenous, Q6H PRN, Carol Ada, MD, 4 mg at 02/04/20 2314 .  pantoprazole (PROTONIX) 80 mg in sodium chloride 0.9 % 100 mL IVPB, 80 mg, Intravenous, Once, Ghimire, Shanker M, MD .  pantoprazole (PROTONIX) 80 mg in sodium chloride 0.9 % 250 mL (0.32 mg/mL) infusion, 8 mg/hr, Intravenous, Continuous, Ghimire, Henreitta Leber, MD .  Derrill Memo ON 02/10/2020] pantoprazole (PROTONIX) injection 40 mg, 40 mg, Intravenous, Q12H, Ghimire, Shanker M, MD .  polyethylene glycol (MIRALAX / GLYCOLAX) packet 17 g, 17 g, Oral, Daily PRN, Carol Ada, MD .  sodium zirconium cyclosilicate (LOKELMA) packet 10 g, 10 g, Oral, Daily, Carol Ada, MD, 10 g at 02/06/20 1046 .  sucralfate (CARAFATE) 1 GM/10ML suspension 1 g, 1 g, Oral, TID WC & HS, Carol Ada, MD, 1 g at 02/06/20 [id number]  . sodium chloride    . pantoprazole (PROTONIX) IVPB    . pantoprozole (PROTONIX) infusion       Vital signs in last 24 hrs: Vitals:   02/06/20 0701 02/06/20 1206  BP:  95/70  Pulse: 81   Resp:    Temp:  97.7 F (36.5 C)  SpO2: 99%     Intake/Output Summary (Last 24 hours) at 02/06/2020 1255 Last data filed at 02/06/2020 [id number] Gross per 24 hour  Intake 1405.65 ml  Output 870 ml  Net 535.65 ml     Physical Exam  Patient is resting comfortably laying in bed, no additional exam performed.  Recent Labs:  CBC Latest Ref Rng & Units 02/06/2020 01/31/2020 02/04/2020  WBC 4.0 - 10.5 K/uL 16.2(H) 17.7(H) 12.5(H)  Hemoglobin 13.0 - 17.0 g/dL 6.1(LL) 8.2(L) 9.7(L)  Hematocrit 39.0 - 52.0 % 19.0(L) 25.6(L) 29.4(L)  Platelets 150 - 400 K/uL 285 357 291    No results for input(s): INR in the last 168 hours. CMP Latest Ref Rng & Units 02/06/2020 02/02/2020 02/04/2020  Glucose 70 - 99 mg/dL 121(H) 66(L) 66(L)   BUN 8 - 23 mg/dL 86(H) 84(H) 75(H)  Creatinine 0.61 - 1.24 mg/dL 2.60(H) 2.35(H) 2.22(H)  Sodium 135 - 145 mmol/L 138 142 140  Potassium 3.5 - 5.1 mmol/L 5.5(H) 5.2(H) 5.1  Chloride 98 - 111 mmol/L 116(H) 111 108  CO2 22 - 32 mmol/L 15(L) 21(L) 21(L)  Calcium 8.9 - 10.3 mg/dL 7.1(L) 7.9(L) 8.1(L)  Total Protein 6.5 - 8.1 g/dL 4.9(L) 5.8(L) 5.9(L)  Total Bilirubin 0.3 - 1.2 mg/dL 0.3 0.5 0.5  Alkaline Phos 38 - 126 U/L 36(L) 50 53  AST 15 - 41 U/L 26 32 38  ALT 0 - 44 U/L 20 21 23     I personally reviewed yesterday's upper endoscopy report with Dr. Benson Norway last evening when he gave me signout.  I reviewed it again today.  Shows severe duodenal ulcer disease with visible vessel and adherent clot.  This was flushed away, area injected, cauterized and clipped.  It appeared to have good hemostatic control by the end of the case.  This was apparently a surprising finding, since the patient had not passed any overt blood per rectum, but had had a decrease in hemoglobin.  There was also an abnormal appearance to the esophageal mucosa, perhaps viral or some other type of esophagitis, biopsies taken.  Patient reportedly was having odynophagia as the main indication for procedure.  Impression and plan Melena Acute blood loss anemia Duodenal ulcer with bleeding Gastric ulcer with bleeding. Covid positivity  It is difficult to tell if there is ongoing bleeding, or if there is is blood from yesterday passing through the GI tract now.  I hope and suspect it is the latter, since there was clearly ongoing GI bleeding most or all of yesterday prior to this endoscopy being performed, until endoscopic control was achieved.  Unfortunately, there was no additional CBC or hemoglobin checks overnight until this morning's value.  Also, he was given regular food, which he apparently had for breakfast today.  Given all that, the patient's age and debility, and Covid positive status, I am not planning to repeat an  upper endoscopy today, he also ate breakfast.  I discussed the case with the hospitalist, we have agreed to give him 2 units of blood, recheck a hemoglobin hematocrit this evening, about 2 hours after transfusion complete, and recheck CBC tomorrow morning.  If at that point there is ongoing high suspicion for continued upper GI bleeding, repeat upper endoscopy may be necessary.  Total time 35 minutes, extensive chart review required, discussion with care team and documentation. Nelida Meuse III Office: [phone]

## 2020-02-06 NOTE — Progress Notes (Signed)
Patient pleasant and cooperative with assessment and meds. Patient given some Magic Cup with meds but does not want HS snack. Patient repositioned at this time. Patient urinated 20 ml then stated he did not have to urinate anymore. Bladder scan done x3, showing 172 ml at most. Patient tolerated well. Patient resting in bed at this time, bed alarm on and call light within reach. Will continue to monitor.

## 2020-02-06 NOTE — Progress Notes (Addendum)
PROGRESS NOTE                                                                                                                                                                                                             Patient Demographics:    Bob Vazquez, is a 79 y.o. male, DOB - 24-Jun-1941, [place]:[id number]  Outpatient Primary MD for the patient is Nolene Ebbs, MD   Admit date - 02/19/2020   LOS - 8  Chief Complaint  Patient presents with  . Altered Mental Status  . Hyperglycemia       Brief Narrative: Patient is a 79 y.o. male with PMHx of DM-2, HTN, HLD, NPH-who was found very confused in an empty bathtub on a wellness check by law enforcement (family had not heard from him for several days)-he was subsequently brought to the ED-and was found to have acute metabolic encephalopathy in the setting of severe sepsis due to UTI, DKA and COVID-19 pneumonia.  He was admitted to the [facility] clinical stability transferred to the [facility] on 2/9.  Further hospital course has been complicated by odynophagia due to severe erosive esophagitis-acute blood loss anemia likely due to upper GI bleeding.  See below for further details.   Subjective:   Lying comfortably in bed-but per nursing staff-no black stools.  Continues to have odynophagia but slightly better.   Assessment  & Plan :   Covid 19 Viral pneumonia: Stable on room air-has completed a course of remdesivir-CRP slowly downtrending now-given GI bleeding-we will stop steroids-patient is on room air as well.  Await CRP this morning.  Fever: afebrile  O2 requirements:  SpO2: 99 % O2 Flow Rate (L/min): Room air  COVID-19 Labs: Recent Labs    02/04/20 0437 02/01/2020 0305 02/06/20 1020  DDIMER 1.23* 1.11* 1.27*  FERRITIN 519* 506*  --   CRP 13.1* 9.6*  --     No results found for: BNP  No results for input(s): PROCALCITON in the last 168  hours.  Lab Results  Component Value Date   SARSCOV2NAA POSITIVE (A) 01/25/2020   [place] Not Detected 07/13/2019     COVID-19 Medications: Steroids: 2/5>> Remdesivir: 2/5>> 2/9  Prone/Incentive Spirometry: encouraged incentive spirometry use 3-4/hour.  DVT Prophylaxis  : Stop subcutaneous heparin-given GI bleeding.  Severe sepsis secondary to UTI: Sepsis physiology has resolved-cultures  positive for Klebsiella pneumonia and Streptococcus agalactiae-blood cultures negative.  Has completed a course of antimicrobial therapy.  Antibiotics: Ceftriaxone: 2/9>>2/11 Cefepime: 2/5>> 2/8  Acute metabolic encephalopathy: Secondary to DKA/AKI-improved-suspect patient not far from usual baseline.  AKI on CKD stage IIIb: AKI likely hemodynamically mediated-creatinine continues to slightly increase-likely secondary to poor oral intake due to odynophagia-an upper GI bleeding.  Bladder scans without significant urinary retention-follow for now.  Hyperkalemia: Slightly worse today-continue Lokelma-but will go over them give 1 dose of insulin/D50.  Repeat electrolytes tomorrow.    Odynophagia: Continues to have severe odynophagia to both solids and liquids-in spite of being on Diflucan, PPI and Carafate.  Barium esophagogram on 2/8 showed some mild smooth narrowing in the distal esophagus.  EGD completed on 2/12-showed severe esophagitis and gastric ulcer with visible vessel/and a nonbleeding duodenal ulcer with an adherent clot.  Since no evidence of Candida esophagitis-stop Diflucan on 2/13.  Upper GI bleeding with acute blood loss anemia: Hemoglobin has been trending down-initially thought to be due to acute illness-however due to EGD findings-high suspicion that this patient is likely having a upper GI bleeding.  Surprisingly- no melanotic stools per nursing staff.  Spoke with GI MD on call-Dr. Simona Huh (covering Dr. Benson Norway) switch to n.p.o.-change twice daily PPI to PPI infusion.  Since hemoglobin  down to 6.1-we will go ahead and transfuse him 2 units of PRBC.  GI to follow and provide further recommendations.  DKA: Resolved.  Unfortunately patient ran out of his insulin-and could not get it refilled-likely provoking behavior along with Covid/UTI.  Insulin-dependent DM-2: CBG stable-continue SSI-as patient will be n.p.o-we will change Lantus to once daily dosing.  Recent Labs    01/30/2020 1714 02/04/2020 2131 02/06/20 0748  GLUCAP 107* 128* 102*   HTN: Controlled-not on any antihypertensives.  HLD: Statins on hold-resume in the next few days.  Orthostatic Hypotension: probably secondary to dehydration/Diabetic neuropathy-retrospectively-may have been developing a GI bleeding that could have contributed to orthostatic hypotension.  All antihypertensives on hold-gentle IV fluid hydration in progress.   Deconditioning/debility: Secondary to acute illness-PT/OT following-recommendations of SNF-social work consulted.  Consults  :  PCCM,GI  Procedures  :  None  ABG:    Component Value Date/Time   HCO3 20.6 02/18/2020 1900   TCO2 22 02/21/2020 1900   ACIDBASEDEF 7.0 (H) 01/25/2020 1900   O2SAT 92.0 01/26/2020 1900    Vent Settings: N/A  Condition -extremely guarded  Family Communication  : Spoke at length with patient's niece-patient's brother is elderly and very hard to understand.  Explained tenuous clinical situation-DNR status-and if patient does not improve-potential for hospice on discharge.  Code Status : DNR-after discussion with patient-discussion started on 2/12-I followed up on 2/13.  Family-niece aware of patient's wishes.  Diet :  Diet Order            DIET DYS 2 Room service appropriate? Yes; Fluid consistency: Thin  Diet effective now               Disposition Plan  :  Remain hospitalized-likely SNF on discharge over the next 2-3 days depending on clinical course.  If he worsens-may require hospice care.  Barriers to discharge: Severe  odynophagia-upper GI bleeding-acute blood loss anemia requiring PRBC transfusion  Antimicorbials  :    Anti-infectives (From admission, onward)   Start     Dose/Rate Route Frequency Ordered Stop   02/02/20 1400  fluconazole (DIFLUCAN) IVPB 100 mg     100 mg 50 mL/hr over 60  Minutes Intravenous Every 24 hours 02/02/20 1341 02/17/2020 1359   02/02/20 1330  cefTRIAXone (ROCEPHIN) 1 g in sodium chloride 0.9 % 100 mL IVPB     1 g 200 mL/hr over 30 Minutes Intravenous Every 24 hours 02/02/20 1328 02/04/20 1234   01/30/20 1830  ceFEPIme (MAXIPIME) 2 g in sodium chloride 0.9 % 100 mL IVPB  Status:  Discontinued     2 g 200 mL/hr over 30 Minutes Intravenous Every 24 hours 01/25/2020 1927 02/02/20 1328   01/30/20 1000  remdesivir 100 mg in sodium chloride 0.9 % 100 mL IVPB     100 mg 200 mL/hr over 30 Minutes Intravenous Daily 02/11/2020 2258 02/02/20 1000   02/09/2020 2300  remdesivir 200 mg in sodium chloride 0.9% 250 mL IVPB     200 mg 580 mL/hr over 30 Minutes Intravenous Once 02/21/2020 2258 01/30/20 0102   02/17/2020 1929  vancomycin variable dose per unstable renal function (pharmacist dosing)  Status:  Discontinued      Does not apply See admin instructions 02/18/2020 1929 01/30/20 1429   02/13/2020 1730  ceFEPIme (MAXIPIME) 2 g in sodium chloride 0.9 % 100 mL IVPB     2 g 200 mL/hr over 30 Minutes Intravenous  Once 02/20/2020 1716 02/11/2020 1935   02/01/2020 1730  metroNIDAZOLE (FLAGYL) IVPB 500 mg     500 mg 100 mL/hr over 60 Minutes Intravenous  Once 02/01/2020 1716 02/20/2020 2000   02/11/2020 1730  vancomycin (VANCOCIN) IVPB 1000 mg/200 mL premix     1,000 mg 200 mL/hr over 60 Minutes Intravenous  Once 02/16/2020 1716 02/13/2020 2005      Inpatient Medications  Scheduled Meds: . aspirin EC  81 mg Oral QHS  . dexamethasone (DECADRON) injection  6 mg Intravenous Q24H  . heparin  5,000 Units Subcutaneous Q8H  . [START ON 02/07/2020] influenza vaccine adjuvanted  0.5 mL Intramuscular Tomorrow-1000  .  insulin aspart  0-15 Units Subcutaneous TID WC  . insulin aspart  0-5 Units Subcutaneous QHS  . insulin glargine  10 Units Subcutaneous BID  . pantoprazole (PROTONIX) IV  40 mg Intravenous Q12H  . sodium zirconium cyclosilicate  10 g Oral Daily  . sucralfate  1 g Oral TID WC & HS   Continuous Infusions: . sodium chloride 75 mL/hr at 02/06/20 1113  . fluconazole (DIFLUCAN) IV Stopped (02/21/2020 1900)   PRN Meds:.   Time Spent in minutes  25  See all Orders from today for further details   Oren Binet M.D on 02/06/2020 at 11:38 AM  To page go to [email or web address] - use universal password  [facility] -  Office  [phone]    Objective:   Vitals:   02/06/20 0400 02/06/20 0600 02/06/20 0701 02/06/20 0728  BP:  (!) 120/57    Pulse: 76 83 81   Resp:  14    Temp:  97.7 F (36.5 C)    TempSrc:  Oral    SpO2: 100% 100% 99%   Weight:    77.4 kg  Height:        Wt Readings from Last 3 Encounters:  02/06/20 77.4 kg  04/12/14 81.6 kg  11/12/13 81.6 kg     Intake/Output Summary (Last 24 hours) at 02/06/2020 1138 Last data filed at 02/06/2020 [id number] Gross per 24 hour  Intake 1405.65 ml  Output 870 ml  Net 535.65 ml     Physical Exam Gen Exam:Alert awake-not in any distress.  Looks frail and chronically ill-appearing. HEENT:atraumatic, normocephalic Chest:  B/L clear to auscultation anteriorly CVS:S1S2 regular Abdomen:soft non tender, non distended Extremities:no edema Neurology: Non focal Skin: no rash   Data Review:    CBC Recent Labs  Lab 02/01/20 0410 02/03/20 0431 02/04/20 0437 02/10/2020 0305 02/06/20 1020  WBC 10.3 11.3* 12.5* 17.7* 16.2*  HGB 11.9* 11.4* 9.7* 8.2* 6.1*  HCT 36.1* 34.5* 29.4* 25.6* 19.0*  PLT 348 364 291 357 285  MCV 85.5 83.9 84.5 86.2 87.6  MCH 28.2 27.7 27.9 27.6 28.1  MCHC 33.0 33.0 33.0 32.0 32.1  RDW 14.6 14.4 14.6 14.9 15.7*    Chemistries  Recent Labs  Lab 02/02/20 0437 02/03/20 0431 02/04/20 0437  01/27/2020 0305 02/06/20 1020  NA 141 139 140 142 138  K 4.6 4.2 5.1 5.2* 5.5*  CL 106 107 108 111 116*  CO2 23 22 21* 21* 15*  GLUCOSE 90 89 66* 66* 121*  BUN 55* 57* 75* 84* 86*  CREATININE 1.99* 1.99* 2.22* 2.35* 2.60*  CALCIUM 8.3* 8.2* 8.1* 7.9* 7.1*  AST 34 38 38 32 26  ALT 26 27 23 21 20   ALKPHOS 56 60 53 50 36*  BILITOT 0.7 0.4 0.5 0.5 0.3   ------------------------------------------------------------------------------------------------------------------ No results for input(s): CHOL, HDL, LDLCALC, TRIG, CHOLHDL, LDLDIRECT in the last 72 hours.  Lab Results  Component Value Date   HGBA1C 9.4 (H) 07/31/2012   ------------------------------------------------------------------------------------------------------------------ No results for input(s): TSH, T4TOTAL, T3FREE, THYROIDAB in the last 72 hours.  Invalid input(s): FREET3 ------------------------------------------------------------------------------------------------------------------ Recent Labs    02/04/20 0437 02/19/2020 0305  FERRITIN 519* 506*    Coagulation profile No results for input(s): INR, PROTIME in the last 168 hours.  Recent Labs    01/27/2020 0305 02/06/20 1020  DDIMER 1.11* 1.27*    Cardiac Enzymes No results for input(s): CKMB, TROPONINI, MYOGLOBIN in the last 168 hours.  Invalid input(s): CK ------------------------------------------------------------------------------------------------------------------ No results found for: BNP  Micro Results Recent Results (from the past 240 hour(s))  MRSA PCR Screening     Status: None   Collection Time: 02/10/2020 12:49 AM   Specimen: Nasopharyngeal  Result Value Ref Range Status   MRSA by PCR NEGATIVE NEGATIVE Final    Comment:        The GeneXpert MRSA Assay (FDA approved for NASAL specimens only), is one component of a comprehensive MRSA colonization surveillance program. It is not intended to diagnose MRSA infection nor to guide  or monitor treatment for MRSA infections. Performed at [facility], [place] [address], [place], [place] [address]   Culture, blood (routine x 2)     Status: None   Collection Time: 02/12/2020  5:03 PM   Specimen: BLOOD  Result Value Ref Range Status   Specimen Description BLOOD RIGHT ANTECUBITAL  Final   Special Requests   Final    BOTTLES DRAWN AEROBIC AND ANAEROBIC Blood Culture adequate volume   Culture   Final    NO GROWTH 5 DAYS Performed at [facility], [place] [address], [place], [place] [address]    Report Status 02/03/2020 FINAL  Final  Culture, blood (routine x 2)     Status: None   Collection Time: 01/31/2020  5:08 PM   Specimen: BLOOD LEFT HAND  Result Value Ref Range Status   Specimen Description BLOOD LEFT HAND  Final   Special Requests   Final    BOTTLES DRAWN AEROBIC ONLY Blood Culture results may not be optimal due to an inadequate volume of blood received in culture bottles  Culture   Final    NO GROWTH 5 DAYS Performed at [facility], [place] [address], [place], [place] [address]    Report Status 02/03/2020 FINAL  Final  Respiratory Panel by RT PCR (Flu A&B, Covid) - Nasopharyngeal Swab     Status: Abnormal   Collection Time: 02/14/2020  6:22 PM   Specimen: Nasopharyngeal Swab  Result Value Ref Range Status   SARS Coronavirus 2 by RT PCR POSITIVE (A) NEGATIVE Final    Comment: RESULT CALLED TO, READ BACK BY AND VERIFIED WITH: [place] 02/07/2020 A BROWNING (NOTE) SARS-CoV-2 target nucleic acids are DETECTED. SARS-CoV-2 RNA is generally detectable in upper respiratory specimens  during the acute phase of infection. Positive results are indicative of the presence of the identified virus, but do not rule out bacterial infection or co-infection with other pathogens not detected by the test. Clinical correlation with patient history and other diagnostic information is necessary to determine patient infection status. The expected result is  Negative. Fact Sheet for Patients:  PinkCheek.be Fact Sheet for Healthcare Providers: GravelBags.it This test is not yet approved or cleared by the [place] FDA and  has been authorized for detection and/or diagnosis of SARS-CoV-2 by FDA under an Emergency Use Authorization (EUA).  This EUA will remain in effect (meaning this test can be used) f or the duration of  the COVID-19 declaration under Section 564(b)(1) of the Act, 21 U.S.C. section 360bbb-3(b)(1), unless the authorization is terminated or revoked sooner.    Influenza A by PCR NEGATIVE NEGATIVE Final   Influenza B by PCR NEGATIVE NEGATIVE Final    Comment: (NOTE) The Xpert Xpress SARS-CoV-2/FLU/RSV assay is intended as an aid in  the diagnosis of influenza from Nasopharyngeal swab specimens and  should not be used as a sole basis for treatment. Nasal washings and  aspirates are unacceptable for Xpert Xpress SARS-CoV-2/FLU/RSV  testing. Fact Sheet for Patients: PinkCheek.be Fact Sheet for Healthcare Providers: GravelBags.it This test is not yet approved or cleared by the [place] FDA and  has been authorized for detection and/or diagnosis of SARS-CoV-2 by  FDA under an Emergency Use Authorization (EUA). This EUA will remain  in effect (meaning this test can be used) for the duration of the  Covid-19 declaration under Section 564(b)(1) of the Act, 21  U.S.C. section 360bbb-3(b)(1), unless the authorization is  terminated or revoked. Performed at [facility], [place] [address], [place], [place] [address]   Urine culture     Status: Abnormal   Collection Time: 02/10/2020  8:06 PM   Specimen: In/Out Cath Urine  Result Value Ref Range Status   Specimen Description IN/OUT CATH URINE  Final   Special Requests NONE  Final   Culture (A)  Final    >=100,000 COLONIES/mL KLEBSIELLA PNEUMONIAE 80,000  COLONIES/mL STREPTOCOCCUS AGALACTIAE TESTING AGAINST S. AGALACTIAE NOT ROUTINELY PERFORMED DUE TO PREDICTABILITY OF AMP/PEN/VAN SUSCEPTIBILITY. Performed at [facility], [place] [address], [place], [place] [address]    Report Status 02/01/2020 FINAL  Final   Organism ID, Bacteria KLEBSIELLA PNEUMONIAE (A)  Final      Susceptibility   Klebsiella pneumoniae - MIC*    AMPICILLIN >=32 RESISTANT Resistant     CEFAZOLIN <=4 SENSITIVE Sensitive     CEFTRIAXONE 0.5 SENSITIVE Sensitive     CIPROFLOXACIN <=0.25 SENSITIVE Sensitive     GENTAMICIN <=1 SENSITIVE Sensitive     IMIPENEM <=0.25 SENSITIVE Sensitive     NITROFURANTOIN 32 SENSITIVE  Sensitive     TRIMETH/SULFA <=20 SENSITIVE Sensitive     AMPICILLIN/SULBACTAM 8 SENSITIVE Sensitive     PIP/TAZO 8 SENSITIVE Sensitive     * >=100,000 COLONIES/mL KLEBSIELLA PNEUMONIAE    Radiology Reports CT Head Wo Contrast  Result Date: 02/12/2020 CLINICAL DATA:  Altered mental status EXAM: CT HEAD WITHOUT CONTRAST TECHNIQUE: Contiguous axial images were obtained from the base of the skull through the vertex without intravenous contrast. COMPARISON:  12/23/2008 FINDINGS: Brain: No evidence of acute infarction, hemorrhage, hydrocephalus, extra-axial collection or mass lesion/mass effect. There is a right frontal approach VP shunt with tip terminating in the right lateral ventricle. There is encephalomalacia along the course of the VP shunt which has slightly worsened since the prior study. There is atrophy and chronic microvascular ischemic changes. There is a persistent 1 cm density at the left frontal horn periventricular white matter, stable from prior study. Vascular: No hyperdense vessel or unexpected calcification. Skull: Normal. Negative for fracture or focal lesion. Sinuses/Orbits: There is mucosal thickening of the maxillary sinuses and ethmoid air cells bilaterally. The remaining paranasal sinuses and mastoid air cells are essentially clear. Other:  None. IMPRESSION: 1. No acute intracranial abnormality. 2. Chronic microvascular ischemic changes and atrophy which has progressed since prior study. 3. Well-positioned VP shunt. Electronically Signed   By: Constance Holster M.D.   On: 02/09/2020 19:32   DG Chest Portable 1 View  Result Date: 02/19/2020 CLINICAL DATA:  Altered mental status EXAM: PORTABLE CHEST 1 VIEW COMPARISON:  09/08/2014 FINDINGS: VP shunt traversing the right chest, continuous where seen. Prominent markings at the bases. No effusion, edema, or pneumothorax. Normal heart size and mediastinal contours. IMPRESSION: Indistinct opacity at the lung bases could be atelectasis (lung volumes are low) or infectious infiltrates. Electronically Signed   By: Monte Fantasia M.D.   On: 02/14/2020 17:47   CT RENAL STONE STUDY  Result Date: 01/30/2020 CLINICAL DATA:  Pyelonephritis. EXAM: CT ABDOMEN AND PELVIS WITHOUT CONTRAST TECHNIQUE: Multidetector CT imaging of the abdomen and pelvis was performed following the standard protocol without IV contrast. COMPARISON:  April 06, 2014. FINDINGS: Lower chest: Multiple airspace opacities are noted in the visualized lung bases concerning for multifocal pneumonia. Hepatobiliary: No focal liver abnormality is seen. No gallstones, gallbladder wall thickening, or biliary dilatation. Pancreas: Unremarkable. No pancreatic ductal dilatation or surrounding inflammatory changes. Spleen: Normal in size without focal abnormality. Adrenals/Urinary Tract: Adrenal glands are unremarkable. Kidneys are normal, without renal calculi, focal lesion, or hydronephrosis. Bladder is unremarkable. Stomach/Bowel: Stomach is within normal limits. Appendix appears normal. No evidence of bowel wall thickening, distention, or inflammatory changes. Vascular/Lymphatic: Aortic atherosclerosis. No enlarged abdominal or pelvic lymph nodes. Reproductive: Prostate is unremarkable. Other: No abdominal wall hernia or abnormality. No abdominopelvic  ascites. There is again noted right-sided ventriculoperitoneal shunt with distal tip in the right lower quadrant. Musculoskeletal: No acute or significant osseous findings. IMPRESSION: 1. Multiple airspace opacities are noted in the visualized lung bases concerning for multifocal pneumonia. 2. Aortic atherosclerosis. 3. No acute abnormality seen in the abdomen or pelvis. Aortic Atherosclerosis (ICD10-I70.0). Electronically Signed   By: Marijo Conception M.D.   On: 01/30/2020 10:48   ECHOCARDIOGRAM LIMITED  Result Date: 01/31/2020   ECHOCARDIOGRAM REPORT   Patient Name:   JOSE PACEY Date of Exam: 01/31/2020 Medical Rec #:  [id number]      Height:       70.0 in Accession #:    [id number]     Weight:  159.8 lb Date of Birth:  1941-05-15      BSA:          1.90 m Patient Age:    63 years       BP:           147/68 mmHg Patient Gender: M              HR:           95 bpm. Exam Location:  Inpatient Procedure: Limited Echo Indications:    Fever 780.6/R50.9  History:        Patient has no prior history of Echocardiogram examinations.                 Risk Factors:Hypertension, Dyslipidemia and Diabetes.  Sonographer:    Clayton Lefort RDCS (AE) Referring Phys: [id number] [place]  1. Left ventricular ejection fraction, by visual estimation, is 60 to 65%. The left ventricle has normal function. There is severely increased left ventricular hypertrophy of the basal septum.  2. Left ventricular diastolic parameters are consistent with Grade I diastolic dysfunction (impaired relaxation).  3. Elevated left ventricular end-diastolic pressure.  4. Global right ventricle has normal systolic function.The right ventricular size is normal. No increase in right ventricular wall thickness.  5. Left atrial size was normal.  6. Right atrial size was normal.  7. Mild to moderate mitral annular calcification.  8. The mitral valve is normal in structure. No evidence of mitral valve regurgitation. No evidence of mitral  stenosis.  9. The tricuspid valve is normal in structure. Tricuspid valve regurgitation is not demonstrated. 10. The aortic valve is tricuspid. Aortic valve regurgitation is not visualized. Mild aortic valve sclerosis without stenosis. 11. The pulmonic valve was normal in structure. Pulmonic valve regurgitation is not visualized. 12. The inferior vena cava is normal in size with greater than 50% respiratory variability, suggesting right atrial pressure of 3 mmHg. 13. TR signal is inadequate for assessing pulmonary artery systolic pressure. 14. Poor acoustical windows limit ability to adequately assess for vegetation. No obvious massess on valve. Recommend TEE if clinically indicated. FINDINGS  Left Ventricle: Left ventricular ejection fraction, by visual estimation, is 60 to 65%. The left ventricle has normal function. The left ventricle is not well visualized. There is severely increased left ventricular hypertrophy of the basal septum. Left  ventricular diastolic parameters are consistent with Grade I diastolic dysfunction (impaired relaxation). Elevated left ventricular end-diastolic pressure. Right Ventricle: The right ventricular size is normal. No increase in right ventricular wall thickness. Global RV systolic function is has normal systolic function. Left Atrium: Left atrial size was normal in size. Right Atrium: Right atrial size was normal in size Pericardium: There is no evidence of pericardial effusion. Mitral Valve: The mitral valve is normal in structure. Mild to moderate mitral annular calcification. No evidence of mitral valve regurgitation. No evidence of mitral valve stenosis by observation. Tricuspid Valve: The tricuspid valve is normal in structure. Tricuspid valve regurgitation is not demonstrated. Aortic Valve: The aortic valve is tricuspid. Aortic valve regurgitation is not visualized. Mild aortic valve sclerosis is present, with no evidence of aortic valve stenosis. Pulmonic Valve: The  pulmonic valve was normal in structure. Pulmonic valve regurgitation is not visualized. Pulmonic regurgitation is not visualized. Aorta: The aortic root, ascending aorta and aortic arch are all structurally normal, with no evidence of dilitation or obstruction. Venous: The inferior vena cava is normal in size with greater than 50% respiratory variability, suggesting right atrial  pressure of 3 mmHg. IAS/Shunts: No atrial level shunt detected by color flow Doppler. There is no evidence of a patent foramen ovale. No ventricular septal defect is seen or detected. There is no evidence of an atrial septal defect.  LEFT VENTRICLE PLAX 2D LVIDd:         3.70 cm  Diastology LVIDs:         2.30 cm  LV e' lateral:   5.33 cm/s LV PW:         1.30 cm  LV E/e' lateral: 14.5 LV IVS:        1.60 cm  LV e' medial:    6.09 cm/s LVOT diam:     2.00 cm  LV E/e' medial:  12.7 LV SV:         40 ml LV SV Index:   21.12 LVOT Area:     3.14 cm  LEFT ATRIUM         Index LA diam:    2.70 cm 1.42 cm/m   AORTA Ao Root diam: 2.80 cm MITRAL VALVE MV Area (PHT): 7.99 cm              SHUNTS MV PHT:        27.55 msec            Systemic Diam: 2.00 cm MV Decel Time: 95 msec MV E velocity: 77.10 cm/s  103 cm/s MV A velocity: 118.00 cm/s 70.3 cm/s MV E/A ratio:  0.65        1.5  Fransico Him MD Electronically signed by Fransico Him MD Signature Date/Time: 01/31/2020/11:57:58 AM    Final    DG ESOPHAGUS W SINGLE CM (SOL OR THIN BA)  Result Date: 02/01/2020 CLINICAL DATA:  Mid chest pain and heartburn. Recent bedside speech pathology evaluation. COVID-19 infection. EXAM: SINGLE COLUMN BARIUM ENEMA TECHNIQUE: Initial scout AP supine abdominal image obtained to insure adequate colon cleansing. Barium was introduced into the colon in a retrograde fashion and refluxed from the rectum to the cecum. Spot images of the colon followed by overhead radiographs were obtained. FLUOROSCOPY TIME:  Fluoroscopy Time: 1 minutes and 0 seconds of low-dose pulsed  fluoroscopy Radiation Exposure Index (if provided by the fluoroscopic device): 8.7 mGy Number of Acquired Spot Images: 0 COMPARISON:  Chest radiographs 02/12/2020. Abdominal CT 01/30/2020. FINDINGS: The study was performed in the supine and right lateral decubitus positions. The esophageal motility appears normal. No laryngeal penetration or abnormality of the cervical esophagus was demonstrated. There is mild smooth narrowing of the distal esophageal lumen without mucosal ulceration. A 13 mm barium tablet was administered and was temporarily delayed in the cervical esophagus. This subsequently passed into the distal esophagus. Despite drinking water and thin barium, this tablet did not pass into the stomach during approximately 3 minutes of intermittent fluoroscopic observation with the patient supine and semi erect. IMPRESSION: 1. Mild smooth narrowing of the distal esophageal lumen, likely due to chronic reflux. No evidence of mucosal ulceration. 2. Barium tablet did not pass into the stomach during approximately 3 minutes of intermittent observation. Electronically Signed   By: Richardean Sale M.D.   On: 02/01/2020 14:25

## 2020-02-07 ENCOUNTER — Inpatient Hospital Stay (HOSPITAL_COMMUNITY): Payer: Medicare Other

## 2020-02-07 DIAGNOSIS — R1310 Dysphagia, unspecified: Secondary | ICD-10-CM

## 2020-02-07 LAB — CBC
HCT: 31.5 % — ABNORMAL LOW (ref 39.0–52.0)
Hemoglobin: 11 g/dL — ABNORMAL LOW (ref 13.0–17.0)
MCH: 29.6 pg (ref 26.0–34.0)
MCHC: 34.9 g/dL (ref 30.0–36.0)
MCV: 84.7 fL (ref 80.0–100.0)
Platelets: 255 10*3/uL (ref 150–400)
RBC: 3.72 MIL/uL — ABNORMAL LOW (ref 4.22–5.81)
RDW: 15.2 % (ref 11.5–15.5)
WBC: 21.7 10*3/uL — ABNORMAL HIGH (ref 4.0–10.5)
nRBC: 3 % — ABNORMAL HIGH (ref 0.0–0.2)

## 2020-02-07 LAB — HEMOGLOBIN A1C
Hgb A1c MFr Bld: 8 % — ABNORMAL HIGH (ref 4.8–5.6)
Mean Plasma Glucose: 182.9 mg/dL

## 2020-02-07 LAB — D-DIMER, QUANTITATIVE: D-Dimer, Quant: 2.08 ug/mL-FEU — ABNORMAL HIGH (ref 0.00–0.50)

## 2020-02-07 LAB — BASIC METABOLIC PANEL
Anion gap: 12 (ref 5–15)
BUN: 67 mg/dL — ABNORMAL HIGH (ref 8–23)
CO2: 15 mmol/L — ABNORMAL LOW (ref 22–32)
Calcium: 7.9 mg/dL — ABNORMAL LOW (ref 8.9–10.3)
Chloride: 117 mmol/L — ABNORMAL HIGH (ref 98–111)
Creatinine, Ser: 2.26 mg/dL — ABNORMAL HIGH (ref 0.61–1.24)
GFR calc Af Amer: 31 mL/min — ABNORMAL LOW (ref >60)
GFR calc non Af Amer: 26 mL/min — ABNORMAL LOW (ref >60)
Glucose, Bld: 70 mg/dL (ref 70–99)
Potassium: 5 mmol/L (ref 3.5–5.1)
Sodium: 144 mmol/L (ref 135–145)

## 2020-02-07 LAB — COMPREHENSIVE METABOLIC PANEL
ALT: 24 U/L (ref 0–44)
AST: 34 U/L (ref 15–41)
Albumin: 2.1 g/dL — ABNORMAL LOW (ref 3.5–5.0)
Alkaline Phosphatase: 47 U/L (ref 38–126)
Anion gap: 9 (ref 5–15)
BUN: 75 mg/dL — ABNORMAL HIGH (ref 8–23)
CO2: 17 mmol/L — ABNORMAL LOW (ref 22–32)
Calcium: 7.8 mg/dL — ABNORMAL LOW (ref 8.9–10.3)
Chloride: 115 mmol/L — ABNORMAL HIGH (ref 98–111)
Creatinine, Ser: 2.49 mg/dL — ABNORMAL HIGH (ref 0.61–1.24)
GFR calc Af Amer: 27 mL/min — ABNORMAL LOW (ref >60)
GFR calc non Af Amer: 23 mL/min — ABNORMAL LOW (ref >60)
Glucose, Bld: 73 mg/dL (ref 70–99)
Potassium: 4.8 mmol/L (ref 3.5–5.1)
Sodium: 141 mmol/L (ref 135–145)
Total Bilirubin: 1.1 mg/dL (ref 0.3–1.2)
Total Protein: 5.4 g/dL — ABNORMAL LOW (ref 6.5–8.1)

## 2020-02-07 LAB — GLUCOSE, CAPILLARY
Glucose-Capillary: 108 mg/dL — ABNORMAL HIGH (ref 70–99)
Glucose-Capillary: 63 mg/dL — ABNORMAL LOW (ref 70–99)
Glucose-Capillary: 105 mg/dL — ABNORMAL HIGH (ref 70–99)
Glucose-Capillary: 127 mg/dL — ABNORMAL HIGH (ref 70–99)
Glucose-Capillary: 212 mg/dL — ABNORMAL HIGH (ref 70–99)

## 2020-02-07 LAB — FERRITIN: Ferritin: 335 ng/mL (ref 24–336)

## 2020-02-07 LAB — HEMOGLOBIN AND HEMATOCRIT, BLOOD
HCT: 33.9 % — ABNORMAL LOW (ref 39.0–52.0)
Hemoglobin: 10.6 g/dL — ABNORMAL LOW (ref 13.0–17.0)

## 2020-02-07 LAB — C-REACTIVE PROTEIN: CRP: 4.4 mg/dL — ABNORMAL HIGH (ref <1.0)

## 2020-02-07 MED ORDER — INSULIN ASPART 100 UNIT/ML ~~LOC~~ SOLN: 0.0000 [IU] | Freq: Three times a day (TID) | SUBCUTANEOUS | Status: DC

## 2020-02-07 MED ORDER — DEXTROSE 50 % IV SOLN
25.0000 mL | Freq: Once | INTRAVENOUS | Status: AC
  Administered 2020-02-07: 08:00:00 25 mL via INTRAVENOUS

## 2020-02-07 MED ORDER — DEXTROSE 50 % IV SOLN
INTRAVENOUS | Status: AC
  Filled 2020-02-07: qty 50

## 2020-02-07 MED ORDER — DEXTROSE 5 % IV SOLN: INTRAVENOUS | Status: DC

## 2020-02-07 MED ORDER — INSULIN ASPART 100 UNIT/ML ~~LOC~~ SOLN
0.0000 [IU] | Freq: Four times a day (QID) | SUBCUTANEOUS | Status: DC
  Administered 2020-02-07: 3 [IU] via SUBCUTANEOUS
  Administered 2020-02-08: 5 [IU] via SUBCUTANEOUS
  Administered 2020-02-08: 3 [IU] via SUBCUTANEOUS
  Administered 2020-02-08 – 2020-02-09 (×3): 2 [IU] via SUBCUTANEOUS
  Administered 2020-02-09: 5 [IU] via SUBCUTANEOUS
  Administered 2020-02-09: 12:00:00 3 [IU] via SUBCUTANEOUS
  Administered 2020-02-09: 5 [IU] via SUBCUTANEOUS
  Administered 2020-02-10: 7 [IU] via SUBCUTANEOUS
  Administered 2020-02-10 – 2020-02-11 (×4): 3 [IU] via SUBCUTANEOUS
  Administered 2020-02-11 (×2): 2 [IU] via SUBCUTANEOUS
  Administered 2020-02-11: 3 [IU] via SUBCUTANEOUS
  Administered 2020-02-12 (×2): 5 [IU] via SUBCUTANEOUS
  Administered 2020-02-12: 2 [IU] via SUBCUTANEOUS
  Administered 2020-02-12: 3 [IU] via SUBCUTANEOUS
  Administered 2020-02-13: 2 [IU] via SUBCUTANEOUS
  Administered 2020-02-13: 08:00:00 6 [IU] via SUBCUTANEOUS

## 2020-02-07 MED ORDER — MAGIC MOUTHWASH W/LIDOCAINE
10.0000 mL | Freq: Three times a day (TID) | ORAL | Status: DC
  Administered 2020-02-07 – 2020-02-10 (×8): 10 mL via ORAL
  Filled 2020-02-07 (×12): qty 10

## 2020-02-07 NOTE — Progress Notes (Signed)
Left message for Bob Vazquez with patient update.

## 2020-02-07 NOTE — Progress Notes (Signed)
Hypoglycemic Event   CBG: 63  Treatment: D50 25 mL (12.5 gm)  Symptoms: None  Follow-up CBG: Time: 0809 CBG Result: 108  Possible Reasons for Event: medication regimen, inadequate meal intake  Comments/MD notified: Dr. Candiss Norse aware & orders for D50 placed    Bob Vazquez

## 2020-02-07 NOTE — Progress Notes (Signed)
PROGRESS NOTE                                                                                                                                                                                                             Patient Demographics:    Bob Vazquez, is a 79 y.o. male, DOB - Aug 13, 1941, [id number]  Outpatient Primary MD for the patient is Nolene Ebbs, MD   Admit date - 02/11/2020   LOS - 9  Chief Complaint  Patient presents with  . Altered Mental Status  . Hyperglycemia       Brief Narrative: Patient is a 79 y.o. male with PMHx of DM-2, HTN, HLD, NPH-who was found very confused in an empty bathtub on a wellness check by law enforcement (family had not heard from him for several days)-he was subsequently brought to the ED-and was found to have acute metabolic encephalopathy in the setting of severe sepsis due to UTI, DKA and COVID-19 pneumonia.  He was admitted to the [facility] clinical stability transferred to the [facility] on 2/9.  Further hospital course has been complicated by odynophagia due to severe erosive esophagitis-acute blood loss anemia likely due to upper GI bleeding.  See below for further details.   Subjective:   Patient in bed, appears comfortable, denies any headache, no fever, no chest pain or pressure, no shortness of breath , no abdominal pain. No focal weakness.  No further dark stools, still has severe pain in swallowing food or liquids.   Assessment  & Plan :   Odynophagia: Continues to have severe odynophagia to both solids and liquids-in spite of being on Diflucan, PPI and Carafate.  Barium esophagogram on 2/8 showed some mild smooth narrowing in the distal esophagus.  EGD completed on 2/12-showed severe esophagitis and gastric ulcer with visible vessel/and a nonbleeding duodenal ulcer with an adherent clot.  Since no evidence of Candida esophagitis-stop Diflucan on 2/13.   Still has intense odynophagia will start on gentle IV fluids as he is n.p.o. due to severe pain.  Upper GI bleeding with acute blood loss anemia: Hemoglobin has been trending down-initially thought to be due to acute illness-however due to EGD findings-high suspicion that this patient is likely having a upper GI bleeding.  Did drop H&H with evidence of acute anemia due to upper GI blood loss requiring  2 units of packed RBC on 02/06/2020, IV PPI, continue to monitor, H&H seems to have stabilized.  Covid 19 Viral pneumonia: Stable on room air-has completed a course of remdesivir-CRP slowly downtrending now-given GI bleeding-we will stop steroids-patient is on room air as well.  Clinically stable from the standpoint.  Asymptomatic on room air.  O2 requirements:  SpO2: 99 % O2 Flow Rate (L/min): Room air  COVID-19 Labs: Recent Labs    02/18/2020 0305 02/06/20 1020 02/07/20 0016 02/07/20 0626  DDIMER 1.11* 1.27*  --  2.08*  FERRITIN 506* 296 335  --   CRP 9.6* 6.0* 4.4*  --     No results found for: BNP  No results for input(s): PROCALCITON in the last 168 hours.  Lab Results  Component Value Date   SARSCOV2NAA POSITIVE (A) 02/13/2020   Fisher Not Detected 07/13/2019     COVID-19 Medications: Steroids: 2/5>> Remdesivir: 2/5>> 2/9  Severe sepsis secondary to UTI: Sepsis physiology has resolved-cultures positive for Klebsiella pneumonia and Streptococcus agalactiae-blood cultures negative.  Has completed a course of antimicrobial therapy.  Antibiotics: Ceftriaxone: 2/9>>2/11 Cefepime: 2/5>> 2/8  Acute metabolic encephalopathy: Secondary to DKA/AKI-improved-suspect patient not far from usual baseline.  AKI on CKD stage IIIb: AKI likely hemodynamically mediated-creatinine continues to slightly increase-likely secondary to poor oral intake due to odynophagia-an upper GI bleeding.  Bladder scans without significant urinary retention-follow for now.  Hyperkalemia: Slightly worse  today-continue Lokelma-but will go over them give 1 dose of insulin/D50.  Repeat electrolytes tomorrow.    DKA: Resolved.  Unfortunately patient ran out of his insulin-and could not get it refilled-likely provoking behavior along with Covid/UTI.  Insulin-dependent DM-2: CBG stable-continue SSI-as patient will be n.p.o-we will change Lantus to once daily dosing.  Lab Results  Component Value Date   HGBA1C 8.0 (H) 02/07/2020    Recent Labs    02/06/20 2055 02/07/20 0740 02/07/20 0809  GLUCAP 106* 63* 108*   HTN: Controlled-not on any antihypertensives.  HLD: Statins on hold-resume in the next few days.  Orthostatic Hypotension: probably secondary to dehydration/Diabetic neuropathy-retrospectively-may have been developing a GI bleeding that could have contributed to orthostatic hypotension.  All antihypertensives on hold-gentle IV fluid hydration in progress.   Deconditioning/debility: Secondary to acute illness-PT/OT following-recommendations of SNF-social work consulted.  Consults  :  PCCM,GI  Procedures  :    EGD -showing severe esophagitis and ulcer  Barium swallow -assistant with esophageal stricture.  Echocardiogram    1. Left ventricular ejection fraction, by visual estimation, is 60 to 65%. The left ventricle has normal function. There is severely increased  left ventricular hypertrophy of the basal septum.  2. Left ventricular diastolic parameters are consistent with Grade I diastolic dysfunction (impaired relaxation).  3. Elevated left ventricular end-diastolic pressure.  4. Global right ventricle has normal systolic function.The right ventricular size is normal. No increase in right ventricular wall thickness.  5. Left atrial size was normal.  6. Right atrial size was normal.  7. Mild to moderate mitral annular calcification.  8. The mitral valve is normal in structure. No evidence of mitral valve regurgitation. No evidence of mitral stenosis.  9. The  tricuspid valve is normal in structure. Tricuspid valve regurgitation is not demonstrated.  10. The aortic valve is tricuspid. Aortic valve regurgitation is not visualized. Mild aortic valve sclerosis without stenosis.  11. The pulmonic valve was normal in structure. Pulmonic valve regurgitation is not visualized.  12. The inferior vena cava is normal in size with greater than  50% respiratory variability, suggesting right atrial pressure of 3 mmHg.  13. TR signal is inadequate for assessing pulmonary artery systolic pressure.  14. Poor acoustical windows limit ability to adequately assess for vegetation. No obvious massess on valve. Recommend TEE if clinically indicated.   Condition -extremely guarded  Family Communication  : Spoke at length with patient's niece-patient's brother is elderly and very hard to understand.  Explained tenuous clinical situation-DNR status-and if patient does not improve-potential for hospice on discharge.  Code Status : DNR-after discussion with patient-discussion started on 2/12-I followed up on 2/13.  Family-niece aware of patient's wishes.  Diet :  Diet Order            Diet NPO time specified  Diet effective now               Disposition Plan  : SNF once oral intake improves, currently n.p.o. due to intense odynophagia due to severe esophagitis and esophageal ulceration.  Antimicorbials  :    Anti-infectives (From admission, onward)   Start     Dose/Rate Route Frequency Ordered Stop   02/02/20 1400  fluconazole (DIFLUCAN) IVPB 100 mg  Status:  Discontinued     100 mg 50 mL/hr over 60 Minutes Intravenous Every 24 hours 02/02/20 1341 02/06/20 1148   02/02/20 1330  cefTRIAXone (ROCEPHIN) 1 g in sodium chloride 0.9 % 100 mL IVPB     1 g 200 mL/hr over 30 Minutes Intravenous Every 24 hours 02/02/20 1328 02/04/20 1234   01/30/20 1830  ceFEPIme (MAXIPIME) 2 g in sodium chloride 0.9 % 100 mL IVPB  Status:  Discontinued     2 g 200 mL/hr over 30 Minutes  Intravenous Every 24 hours 02/13/2020 1927 02/02/20 1328   01/30/20 1000  remdesivir 100 mg in sodium chloride 0.9 % 100 mL IVPB     100 mg 200 mL/hr over 30 Minutes Intravenous Daily 02/11/2020 2258 02/02/20 1000   02/16/2020 2300  remdesivir 200 mg in sodium chloride 0.9% 250 mL IVPB     200 mg 580 mL/hr over 30 Minutes Intravenous Once 02/12/2020 2258 01/30/20 0102   02/02/2020 1929  vancomycin variable dose per unstable renal function (pharmacist dosing)  Status:  Discontinued      Does not apply See admin instructions 02/14/2020 1929 01/30/20 1429   02/13/2020 1730  ceFEPIme (MAXIPIME) 2 g in sodium chloride 0.9 % 100 mL IVPB     2 g 200 mL/hr over 30 Minutes Intravenous  Once 02/18/2020 1716 01/28/2020 1935   02/09/2020 1730  metroNIDAZOLE (FLAGYL) IVPB 500 mg     500 mg 100 mL/hr over 60 Minutes Intravenous  Once 02/13/2020 1716 01/27/2020 2000   02/21/2020 1730  vancomycin (VANCOCIN) IVPB 1000 mg/200 mL premix     1,000 mg 200 mL/hr over 60 Minutes Intravenous  Once 02/09/2020 1716 02/21/2020 2005     DVT prophylaxis.  SCDs added.   Inpatient Medications  Scheduled Meds: . insulin aspart  0-9 Units Subcutaneous Q6H  . [START ON 02/10/2020] pantoprazole  40 mg Intravenous Q12H  . sodium zirconium cyclosilicate  10 g Oral Daily  . sucralfate  1 g Oral TID WC & HS   Continuous Infusions: . dextrose    . pantoprozole (PROTONIX) infusion 8 mg/hr (02/07/20 0647)   PRN Meds:.   Time Spent in minutes  25  See all Orders from today for further details   Lala Lund M.D on 02/07/2020 at 11:56 AM  To page go to [email or web address] -  use universal password  [facility] -  Office  [phone]    Objective:   Vitals:   02/07/20 0000 02/07/20 0400 02/07/20 0500 02/07/20 0716  BP: (!) 130/59 (!) 143/59  (!) 142/61  Pulse: 80 84  78  Resp:  18    Temp:  98 F (36.7 C)  97.8 F (36.6 C)  TempSrc:  Oral  Oral  SpO2: 100% 99%  98%  Weight:   75.6 kg   Height:        Wt Readings from Last 3  Encounters:  02/07/20 75.6 kg  04/12/14 81.6 kg  11/12/13 81.6 kg     Intake/Output Summary (Last 24 hours) at 02/07/2020 1156 Last data filed at 02/07/2020 [id number] Gross per 24 hour  Intake 1962.07 ml  Output 1500 ml  Net 462.07 ml     Physical Exam  Elderly week African-American male lying in hospital bed in no discomfort, awake Alert,   [place].AT,PERRAL Supple Neck,No JVD, No cervical lymphadenopathy appriciated.  Symmetrical Chest wall movement, Good air movement bilaterally, CTAB RRR,No Gallops, Rubs or new Murmurs, No Parasternal Heave +ve B.Sounds, Abd Soft, No tenderness, No organomegaly appriciated, No rebound - guarding or rigidity. No Cyanosis, Clubbing or edema, No new Rash or bruise    Data Review:    CBC Recent Labs  Lab 02/03/20 0431 02/03/20 0431 02/04/20 0437 02/17/2020 0305 02/06/20 1020 02/07/20 0016 02/07/20 0626  WBC 11.3*  --  12.5* 17.7* 16.2* 21.7*  --   HGB 11.4*   < > 9.7* 8.2* 6.1* 11.0* 10.6*  HCT 34.5*   < > 29.4* 25.6* 19.0* 31.5* 33.9*  PLT 364  --  291 357 285 255  --   MCV 83.9  --  84.5 86.2 87.6 84.7  --   MCH 27.7  --  27.9 27.6 28.1 29.6  --   MCHC 33.0  --  33.0 32.0 32.1 34.9  --   RDW 14.4  --  14.6 14.9 15.7* 15.2  --    < > = values in this interval not displayed.    Chemistries  Recent Labs  Lab 02/03/20 0431 02/03/20 0431 02/04/20 0437 02/13/2020 0305 02/06/20 1020 02/07/20 0016 02/07/20 0626  NA 139   < > 140 142 138 141 144  K 4.2   < > 5.1 5.2* 5.5* 4.8 5.0  CL 107   < > 108 111 116* 115* 117*  CO2 22   < > 21* 21* 15* 17* 15*  GLUCOSE 89   < > 66* 66* 121* 73 70  BUN 57*   < > 75* 84* 86* 75* 67*  CREATININE 1.99*   < > 2.22* 2.35* 2.60* 2.49* 2.26*  CALCIUM 8.2*   < > 8.1* 7.9* 7.1* 7.8* 7.9*  AST 38  --  38 32 26 34  --   ALT 27  --  23 21 20 24   --   ALKPHOS 60  --  53 50 36* 47  --   BILITOT 0.4  --  0.5 0.5 0.3 1.1  --    < > = values in this interval not displayed.    ------------------------------------------------------------------------------------------------------------------ No results for input(s): CHOL, HDL, LDLCALC, TRIG, CHOLHDL, LDLDIRECT in the last 72 hours.  Lab Results  Component Value Date   HGBA1C 8.0 (H) 02/07/2020   ------------------------------------------------------------------------------------------------------------------ No results for input(s): TSH, T4TOTAL, T3FREE, THYROIDAB in the last 72 hours.  Invalid input(s): FREET3 ------------------------------------------------------------------------------------------------------------------ Recent Labs    02/06/20 1020 02/07/20 0016  FERRITIN 296 335    Coagulation profile No results for input(s): INR, PROTIME in the last 168 hours.  Recent Labs    02/06/20 1020 02/07/20 0626  DDIMER 1.27* 2.08*    Cardiac Enzymes No results for input(s): CKMB, TROPONINI, MYOGLOBIN in the last 168 hours.  Invalid input(s): CK ------------------------------------------------------------------------------------------------------------------ No results found for: BNP  Micro Results Recent Results (from the past 240 hour(s))  MRSA PCR Screening     Status: None   Collection Time: 02/03/2020 12:49 AM   Specimen: Nasopharyngeal  Result Value Ref Range Status   MRSA by PCR NEGATIVE NEGATIVE Final    Comment:        The GeneXpert MRSA Assay (FDA approved for NASAL specimens only), is one component of a comprehensive MRSA colonization surveillance program. It is not intended to diagnose MRSA infection nor to guide or monitor treatment for MRSA infections. Performed at [facility], [place] [address] [place], [place] [address]   Culture, blood (routine x 2)     Status: None   Collection Time: 02/13/2020  5:03 PM   Specimen: BLOOD  Result Value Ref Range Status   Specimen Description BLOOD RIGHT ANTECUBITAL  Final   Special Requests   Final    BOTTLES DRAWN AEROBIC AND  ANAEROBIC Blood Culture adequate volume   Culture   Final    NO GROWTH 5 DAYS Performed at [facility], [place] [address] [place], [place], [place] [address]    Report Status 02/03/2020 FINAL  Final  Culture, blood (routine x 2)     Status: None   Collection Time: 02/03/2020  5:08 PM   Specimen: BLOOD LEFT HAND  Result Value Ref Range Status   Specimen Description BLOOD LEFT HAND  Final   Special Requests   Final    BOTTLES DRAWN AEROBIC ONLY Blood Culture results may not be optimal due to an inadequate volume of blood received in culture bottles   Culture   Final    NO GROWTH 5 DAYS Performed at [facility], [place] [address] [place], [place] [address]    Report Status 02/03/2020 FINAL  Final  Respiratory Panel by RT PCR (Flu A&B, Covid) - Nasopharyngeal Swab     Status: Abnormal   Collection Time: 02/16/2020  6:22 PM   Specimen: Nasopharyngeal Swab  Result Value Ref Range Status   SARS Coronavirus 2 by RT PCR POSITIVE (A) NEGATIVE Final    Comment: RESULT CALLED TO, READ BACK BY AND VERIFIED WITH: Downing 01/31/2020 A BROWNING (NOTE) SARS-CoV-2 target nucleic acids are DETECTED. SARS-CoV-2 RNA is generally detectable in upper respiratory specimens  during the acute phase of infection. Positive results are indicative of the presence of the identified virus, but do not rule out bacterial infection or co-infection with other pathogens not detected by the test. Clinical correlation with patient history and other diagnostic information is necessary to determine patient infection status. The expected result is Negative. Fact Sheet for Patients:  PinkCheek.be Fact Sheet for Healthcare Providers: GravelBags.it This test is not yet approved or cleared by the [place] FDA and  has been authorized for detection and/or diagnosis of SARS-CoV-2 by FDA under an Emergency Use Authorization (EUA).  This EUA will remain in  effect (meaning this test can be used) f or the duration of  the COVID-19 declaration under Section 564(b)(1) of the Act, 21 U.S.C. section 360bbb-3(b)(1), unless the authorization is terminated or revoked sooner.    Influenza A  by PCR NEGATIVE NEGATIVE Final   Influenza B by PCR NEGATIVE NEGATIVE Final    Comment: (NOTE) The Xpert Xpress SARS-CoV-2/FLU/RSV assay is intended as an aid in  the diagnosis of influenza from Nasopharyngeal swab specimens and  should not be used as a sole basis for treatment. Nasal washings and  aspirates are unacceptable for Xpert Xpress SARS-CoV-2/FLU/RSV  testing. Fact Sheet for Patients: PinkCheek.be Fact Sheet for Healthcare Providers: GravelBags.it This test is not yet approved or cleared by the [place] FDA and  has been authorized for detection and/or diagnosis of SARS-CoV-2 by  FDA under an Emergency Use Authorization (EUA). This EUA will remain  in effect (meaning this test can be used) for the duration of the  Covid-19 declaration under Section 564(b)(1) of the Act, 21  U.S.C. section 360bbb-3(b)(1), unless the authorization is  terminated or revoked. Performed at [facility], [place] [address] [place], [place] [address]   Urine culture     Status: Abnormal   Collection Time: 02/20/2020  8:06 PM   Specimen: In/Out Cath Urine  Result Value Ref Range Status   Specimen Description IN/OUT CATH URINE  Final   Special Requests NONE  Final   Culture (A)  Final    >=100,000 COLONIES/mL KLEBSIELLA PNEUMONIAE 80,000 COLONIES/mL STREPTOCOCCUS AGALACTIAE TESTING AGAINST S. AGALACTIAE NOT ROUTINELY PERFORMED DUE TO PREDICTABILITY OF AMP/PEN/VAN SUSCEPTIBILITY. Performed at [facility], [place] [address] [place], [place] [address]    Report Status 02/01/2020 FINAL  Final   Organism ID, Bacteria KLEBSIELLA PNEUMONIAE (A)  Final      Susceptibility   Klebsiella pneumoniae - MIC*     AMPICILLIN >=32 RESISTANT Resistant     CEFAZOLIN <=4 SENSITIVE Sensitive     CEFTRIAXONE 0.5 SENSITIVE Sensitive     CIPROFLOXACIN <=0.25 SENSITIVE Sensitive     GENTAMICIN <=1 SENSITIVE Sensitive     IMIPENEM <=0.25 SENSITIVE Sensitive     NITROFURANTOIN 32 SENSITIVE Sensitive     TRIMETH/SULFA <=20 SENSITIVE Sensitive     AMPICILLIN/SULBACTAM 8 SENSITIVE Sensitive     PIP/TAZO 8 SENSITIVE Sensitive     * >=100,000 COLONIES/mL KLEBSIELLA PNEUMONIAE    Radiology Reports CT Head Wo Contrast  Result Date: 02/11/2020 CLINICAL DATA:  Altered mental status EXAM: CT HEAD WITHOUT CONTRAST TECHNIQUE: Contiguous axial images were obtained from the base of the skull through the vertex without intravenous contrast. COMPARISON:  12/23/2008 FINDINGS: Brain: No evidence of acute infarction, hemorrhage, hydrocephalus, extra-axial collection or mass lesion/mass effect. There is a right frontal approach VP shunt with tip terminating in the right lateral ventricle. There is encephalomalacia along the course of the VP shunt which has slightly worsened since the prior study. There is atrophy and chronic microvascular ischemic changes. There is a persistent 1 cm density at the left frontal horn periventricular white matter, stable from prior study. Vascular: No hyperdense vessel or unexpected calcification. Skull: Normal. Negative for fracture or focal lesion. Sinuses/Orbits: There is mucosal thickening of the maxillary sinuses and ethmoid air cells bilaterally. The remaining paranasal sinuses and mastoid air cells are essentially clear. Other: None. IMPRESSION: 1. No acute intracranial abnormality. 2. Chronic microvascular ischemic changes and atrophy which has progressed since prior study. 3. Well-positioned VP shunt. Electronically Signed   By: Constance Holster M.D.   On: 02/05/2020 19:32   DG Chest Port 1 View  Result Date: 02/07/2020 CLINICAL DATA:  Shortness of breath. EXAM: PORTABLE CHEST 1 VIEW COMPARISON:   January 29, 2020 FINDINGS: The  mediastinal contour and cardiac silhouette are stable. The heart size is mildly enlarged. Mild patchy opacities are identified in both lung bases, slightly worsened compared prior exam. A VP shunt is identified unchanged. There is no pleural effusion. IMPRESSION: Mild patchy opacities are identified in both lung bases, developing pneumonias are not excluded. Electronically Signed   By: Abelardo Diesel M.D.   On: 02/07/2020 08:36   DG Chest Portable 1 View  Result Date: 01/30/2020 CLINICAL DATA:  Altered mental status EXAM: PORTABLE CHEST 1 VIEW COMPARISON:  09/08/2014 FINDINGS: VP shunt traversing the right chest, continuous where seen. Prominent markings at the bases. No effusion, edema, or pneumothorax. Normal heart size and mediastinal contours. IMPRESSION: Indistinct opacity at the lung bases could be atelectasis (lung volumes are low) or infectious infiltrates. Electronically Signed   By: Monte Fantasia M.D.   On: 02/19/2020 17:47   CT RENAL STONE STUDY  Result Date: 01/30/2020 CLINICAL DATA:  Pyelonephritis. EXAM: CT ABDOMEN AND PELVIS WITHOUT CONTRAST TECHNIQUE: Multidetector CT imaging of the abdomen and pelvis was performed following the standard protocol without IV contrast. COMPARISON:  April 06, 2014. FINDINGS: Lower chest: Multiple airspace opacities are noted in the visualized lung bases concerning for multifocal pneumonia. Hepatobiliary: No focal liver abnormality is seen. No gallstones, gallbladder wall thickening, or biliary dilatation. Pancreas: Unremarkable. No pancreatic ductal dilatation or surrounding inflammatory changes. Spleen: Normal in size without focal abnormality. Adrenals/Urinary Tract: Adrenal glands are unremarkable. Kidneys are normal, without renal calculi, focal lesion, or hydronephrosis. Bladder is unremarkable. Stomach/Bowel: Stomach is within normal limits. Appendix appears normal. No evidence of bowel wall thickening, distention, or  inflammatory changes. Vascular/Lymphatic: Aortic atherosclerosis. No enlarged abdominal or pelvic lymph nodes. Reproductive: Prostate is unremarkable. Other: No abdominal wall hernia or abnormality. No abdominopelvic ascites. There is again noted right-sided ventriculoperitoneal shunt with distal tip in the right lower quadrant. Musculoskeletal: No acute or significant osseous findings. IMPRESSION: 1. Multiple airspace opacities are noted in the visualized lung bases concerning for multifocal pneumonia. 2. Aortic atherosclerosis. 3. No acute abnormality seen in the abdomen or pelvis. Aortic Atherosclerosis (ICD10-I70.0). Electronically Signed   By: Marijo Conception M.D.   On: 01/30/2020 10:48   ECHOCARDIOGRAM LIMITED  Result Date: 01/31/2020   ECHOCARDIOGRAM REPORT   Patient Name:   MATTY OFTEDAHL Date of Exam: 01/31/2020 Medical Rec #:  [id number]      Height:       70.0 in Accession #:    [id number]     Weight:       159.8 lb Date of Birth:  [birth year]      BSA:          1.90 m Patient Age:    59 years       BP:           147/68 mmHg Patient Gender: M              HR:           95 bpm. Exam Location:  Inpatient Procedure: Limited Echo Indications:    Fever 780.6/R50.9  History:        Patient has no prior history of Echocardiogram examinations.                 Risk Factors:Hypertension, Dyslipidemia and Diabetes.  Sonographer:    Clayton Lefort RDCS (AE) Referring Phys: [id number] [place]  1. Left ventricular ejection fraction, by visual estimation, is 60 to 65%. The left ventricle has normal function.  There is severely increased left ventricular hypertrophy of the basal septum.  2. Left ventricular diastolic parameters are consistent with Grade I diastolic dysfunction (impaired relaxation).  3. Elevated left ventricular end-diastolic pressure.  4. Global right ventricle has normal systolic function.The right ventricular size is normal. No increase in right ventricular wall thickness.  5. Left atrial  size was normal.  6. Right atrial size was normal.  7. Mild to moderate mitral annular calcification.  8. The mitral valve is normal in structure. No evidence of mitral valve regurgitation. No evidence of mitral stenosis.  9. The tricuspid valve is normal in structure. Tricuspid valve regurgitation is not demonstrated. 10. The aortic valve is tricuspid. Aortic valve regurgitation is not visualized. Mild aortic valve sclerosis without stenosis. 11. The pulmonic valve was normal in structure. Pulmonic valve regurgitation is not visualized. 12. The inferior vena cava is normal in size with greater than 50% respiratory variability, suggesting right atrial pressure of 3 mmHg. 13. TR signal is inadequate for assessing pulmonary artery systolic pressure. 14. Poor acoustical windows limit ability to adequately assess for vegetation. No obvious massess on valve. Recommend TEE if clinically indicated. FINDINGS  Left Ventricle: Left ventricular ejection fraction, by visual estimation, is 60 to 65%. The left ventricle has normal function. The left ventricle is not well visualized. There is severely increased left ventricular hypertrophy of the basal septum. Left  ventricular diastolic parameters are consistent with Grade I diastolic dysfunction (impaired relaxation). Elevated left ventricular end-diastolic pressure. Right Ventricle: The right ventricular size is normal. No increase in right ventricular wall thickness. Global RV systolic function is has normal systolic function. Left Atrium: Left atrial size was normal in size. Right Atrium: Right atrial size was normal in size Pericardium: There is no evidence of pericardial effusion. Mitral Valve: The mitral valve is normal in structure. Mild to moderate mitral annular calcification. No evidence of mitral valve regurgitation. No evidence of mitral valve stenosis by observation. Tricuspid Valve: The tricuspid valve is normal in structure. Tricuspid valve regurgitation is not  demonstrated. Aortic Valve: The aortic valve is tricuspid. Aortic valve regurgitation is not visualized. Mild aortic valve sclerosis is present, with no evidence of aortic valve stenosis. Pulmonic Valve: The pulmonic valve was normal in structure. Pulmonic valve regurgitation is not visualized. Pulmonic regurgitation is not visualized. Aorta: The aortic root, ascending aorta and aortic arch are all structurally normal, with no evidence of dilitation or obstruction. Venous: The inferior vena cava is normal in size with greater than 50% respiratory variability, suggesting right atrial pressure of 3 mmHg. IAS/Shunts: No atrial level shunt detected by color flow Doppler. There is no evidence of a patent foramen ovale. No ventricular septal defect is seen or detected. There is no evidence of an atrial septal defect.  LEFT VENTRICLE PLAX 2D LVIDd:         3.70 cm  Diastology LVIDs:         2.30 cm  LV e' lateral:   5.33 cm/s LV PW:         1.30 cm  LV E/e' lateral: 14.5 LV IVS:        1.60 cm  LV e' medial:    6.09 cm/s LVOT diam:     2.00 cm  LV E/e' medial:  12.7 LV SV:         40 ml LV SV Index:   21.12 LVOT Area:     3.14 cm  LEFT ATRIUM  Index LA diam:    2.70 cm 1.42 cm/m   AORTA Ao Root diam: 2.80 cm MITRAL VALVE MV Area (PHT): 7.99 cm              SHUNTS MV PHT:        27.55 msec            Systemic Diam: 2.00 cm MV Decel Time: 95 msec MV E velocity: 77.10 cm/s  103 cm/s MV A velocity: 118.00 cm/s 70.3 cm/s MV E/A ratio:  0.65        1.5  Fransico Him MD Electronically signed by Fransico Him MD Signature Date/Time: 01/31/2020/11:57:58 AM    Final    DG ESOPHAGUS W SINGLE CM (SOL OR THIN BA)  Result Date: 02/01/2020 CLINICAL DATA:  Mid chest pain and heartburn. Recent bedside speech pathology evaluation. COVID-19 infection. EXAM: SINGLE COLUMN BARIUM ENEMA TECHNIQUE: Initial scout AP supine abdominal image obtained to insure adequate colon cleansing. Barium was introduced into the colon in a retrograde  fashion and refluxed from the rectum to the cecum. Spot images of the colon followed by overhead radiographs were obtained. FLUOROSCOPY TIME:  Fluoroscopy Time: 1 minutes and 0 seconds of low-dose pulsed fluoroscopy Radiation Exposure Index (if provided by the fluoroscopic device): 8.7 mGy Number of Acquired Spot Images: 0 COMPARISON:  Chest radiographs 02/02/2020. Abdominal CT 01/30/2020. FINDINGS: The study was performed in the supine and right lateral decubitus positions. The esophageal motility appears normal. No laryngeal penetration or abnormality of the cervical esophagus was demonstrated. There is mild smooth narrowing of the distal esophageal lumen without mucosal ulceration. A 13 mm barium tablet was administered and was temporarily delayed in the cervical esophagus. This subsequently passed into the distal esophagus. Despite drinking water and thin barium, this tablet did not pass into the stomach during approximately 3 minutes of intermittent fluoroscopic observation with the patient supine and semi erect. IMPRESSION: 1. Mild smooth narrowing of the distal esophageal lumen, likely due to chronic reflux. No evidence of mucosal ulceration. 2. Barium tablet did not pass into the stomach during approximately 3 minutes of intermittent observation. Electronically Signed   By: Richardean Sale M.D.   On: 02/01/2020 14:25   Signature  Lala Lund M.D on 02/07/2020 at 11:56 AM   -  To page go to [email or web address]

## 2020-02-07 NOTE — Progress Notes (Signed)
[facility] Progress Note (Weekend coverage for Dr. Benson Norway) Chief Complaint: Gastric and duodenal ulcer bleeding  History:  Due to patient's Covid positivity, and to reduce staff exposure, patient was not seen in person.  I reviewed the hospitalist note regarding the patient's clinical update. No reported recurrence of the melena since yesterday morning.  Hemoglobin came up better than expected with transfusion, and is remained stable on repeat check after that. Still reportedly with severe odynophagia.  A diffuse abnormality of the esophageal mucosa was seen on endoscopy, nature of which is not yet clear, biopsies are pending.  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  Bleeding yesterday morning was most likely old blood from the day prior when the procedure was done. Thankfully steroids will be discontinued, as patient's respiratory status is reportedly stable, and this will hopefully allow quicker ulcer healing. He is on fluconazole, PPI and Carafate for his odynophagia.  ROS: Unable to obtain, patient not seen in person  Objective:   Current Facility-Administered Medications:  .  acetaminophen (TYLENOL) tablet 650 mg, 650 mg, Oral, Q6H PRN, Carol Ada, MD, 650 mg at 02/13/2020 2233 .  albuterol (VENTOLIN HFA) 108 (90 Base) MCG/ACT inhaler 2 puff, 2 puff, Inhalation, Q4H PRN, Carol Ada, MD .  benzonatate (TESSALON) capsule 200 mg, 200 mg, Oral, TID PRN, Carol Ada, MD, 200 mg at 02/03/20 1524 .  dextrose 5 % solution, , Intravenous, Continuous, Thurnell Lose, MD, Last Rate: 50 mL/hr at 02/07/20 1202, New Bag at 02/07/20 1202 .  insulin aspart (novoLOG) injection 0-9 Units, 0-9 Units, Subcutaneous, Q6H, Cand[facility] Norse, Prashant K, MD .  ondansetron (ZOFRAN) injection 4 mg, 4 mg, Intravenous, Q6H PRN, Carol Ada, MD, 4 mg at 02/04/20 2314 .  pantoprazole (PROTONIX) 80 mg in sodium chloride 0.9 % 250 mL (0.32 mg/mL) infusion, 8  mg/hr, Intravenous, Continuous, Ghimire, Henreitta Leber, MD, Last Rate: 25 mL/hr at 02/07/20 0647, 8 mg/hr at 02/07/20 0647 .  [START ON 02/10/2020] pantoprazole (PROTONIX) injection 40 mg, 40 mg, Intravenous, Q12H, Ghimire, Shanker M, MD .  polyethylene glycol (MIRALAX / GLYCOLAX) packet 17 g, 17 g, Oral, Daily PRN, Carol Ada, MD .  sodium zirconium cyclosilicate (LOKELMA) packet 10 g, 10 g, Oral, Daily, Carol Ada, MD, 10 g at 02/07/20 1059 .  sucralfate (CARAFATE) 1 GM/10ML suspension 1 g, 1 g, Oral, TID WC & HS, Carol Ada, MD, 1 g at 02/07/20 1158  . dextrose 50 mL/hr at 02/07/20 1202  . pantoprozole (PROTONIX) infusion 8 mg/hr (02/07/20 0647)     Vital signs in last 24 hrs: Vitals:   02/07/20 0400 02/07/20 0716  BP: (!) 143/59 (!) 142/61  Pulse: 84 78  Resp: 18   Temp: 98 F (36.7 C) 97.8 F (36.6 C)  SpO2: 99% 98%    Intake/Output Summary (Last 24 hours) at 02/07/2020 1215 Last data filed at 02/07/2020 0647 Gross per 24 hour  Intake 1962.07 ml  Output 1500 ml  Net 462.07 ml    No exam Recent Labs:  CBC Latest Ref Rng & Units 02/07/2020 02/07/2020 02/06/2020  WBC 4.0 - 10.5 K/uL - 21.7(H) 16.2(H)  Hemoglobin 13.0 - 17.0 g/dL 10.6(L) 11.0(L) 6.1(LL)  Hematocrit 39.0 - 52.0 % 33.9(L) 31.5(L) 19.0(L)  Platelets 150 - 400 K/uL - 255 285    No results for input(s): INR in the last 168 hours. CMP Latest Ref Rng & Units 02/07/2020 02/07/2020 02/06/2020  Glucose 70 - 99 mg/dL 70 73 121(H)  BUN 8 - 23 mg/dL 67(H) 75(H) 86(H)  Creatinine 0.61 - 1.24 mg/dL 2.26(H) 2.49(H) 2.60(H)  Sodium 135 - 145 mmol/L 144 141 138  Potassium 3.5 - 5.1 mmol/L 5.0 4.8 5.5(H)  Chloride 98 - 111 mmol/L 117(H) 115(H) 116(H)  CO2 22 - 32 mmol/L 15(L) 17(L) 15(L)  Calcium 8.9 - 10.3 mg/dL 7.9(L) 7.8(L) 7.1(L)  Total Protein 6.5 - 8.1 g/dL - 5.4(L) 4.9(L)  Total Bilirubin 0.3 - 1.2 mg/dL - 1.1 0.3  Alkaline Phos 38 - 126 U/L - 47 36(L)  AST 15 - 41 U/L - 34 26  ALT 0 - 44 U/L - 24 20     @ASSESSMENTPLANBEGIN @ Assessment: Odynophagia, abnormal findings on EGD, cause not yet clear.  Refractory to current therapy.  I will message the hospitalist and see what they would think about some Magic mouthwash, as the viscous lidocaine might help relieve some of the pain.  Gastric ulcer with bleeding Duodenal ulcer with bleeding.  Source unclear, treated endoscopically, biopsies pending, steroids discontinued.  On PPI.  Wonder if this all could be Covid related. Fortunately, active bleeding has stopped.  Plan: Diet as tolerated Meds as above, will possibly add Magic mouthwash if agreeable to hospitalist. I will sign the patient back out to Drs. Hung and Collene Mares this evening, so they can follow him up tomorrow.  Call if needed. Nelida Meuse III Office: [phone]

## 2020-02-08 LAB — MAGNESIUM: Magnesium: 2.1 mg/dL (ref 1.7–2.4)

## 2020-02-08 LAB — PROTIME-INR
INR: 1.2 (ref 0.8–1.2)
Prothrombin Time: 15.3 seconds — ABNORMAL HIGH (ref 11.4–15.2)

## 2020-02-08 LAB — CBC WITH DIFFERENTIAL/PLATELET
Abs Immature Granulocytes: 0.71 10*3/uL — ABNORMAL HIGH (ref 0.00–0.07)
Basophils Absolute: 0.1 10*3/uL (ref 0.0–0.1)
Basophils Relative: 0 %
Eosinophils Absolute: 0 10*3/uL (ref 0.0–0.5)
Eosinophils Relative: 0 %
HCT: 24.7 % — ABNORMAL LOW (ref 39.0–52.0)
Hemoglobin: 8.2 g/dL — ABNORMAL LOW (ref 13.0–17.0)
Immature Granulocytes: 5 %
Lymphocytes Relative: 7 %
Lymphs Abs: 1 10*3/uL (ref 0.7–4.0)
MCH: 29.3 pg (ref 26.0–34.0)
MCHC: 33.2 g/dL (ref 30.0–36.0)
MCV: 88.2 fL (ref 80.0–100.0)
Monocytes Absolute: 1.1 10*3/uL — ABNORMAL HIGH (ref 0.1–1.0)
Monocytes Relative: 8 %
Neutro Abs: 11 10*3/uL — ABNORMAL HIGH (ref 1.7–7.7)
Neutrophils Relative %: 80 %
Platelets: 218 10*3/uL (ref 150–400)
RBC: 2.8 MIL/uL — ABNORMAL LOW (ref 4.22–5.81)
RDW: 16.1 % — ABNORMAL HIGH (ref 11.5–15.5)
WBC: 13.8 10*3/uL — ABNORMAL HIGH (ref 4.0–10.5)
nRBC: 2.1 % — ABNORMAL HIGH (ref 0.0–0.2)

## 2020-02-08 LAB — COMPREHENSIVE METABOLIC PANEL
ALT: 21 U/L (ref 0–44)
AST: 30 U/L (ref 15–41)
Albumin: 1.6 g/dL — ABNORMAL LOW (ref 3.5–5.0)
Alkaline Phosphatase: 35 U/L — ABNORMAL LOW (ref 38–126)
Anion gap: 10 (ref 5–15)
BUN: 45 mg/dL — ABNORMAL HIGH (ref 8–23)
CO2: 17 mmol/L — ABNORMAL LOW (ref 22–32)
Calcium: 7.2 mg/dL — ABNORMAL LOW (ref 8.9–10.3)
Chloride: 111 mmol/L (ref 98–111)
Creatinine, Ser: 2.14 mg/dL — ABNORMAL HIGH (ref 0.61–1.24)
GFR calc Af Amer: 33 mL/min — ABNORMAL LOW (ref >60)
GFR calc non Af Amer: 28 mL/min — ABNORMAL LOW (ref >60)
Glucose, Bld: 196 mg/dL — ABNORMAL HIGH (ref 70–99)
Potassium: 4.5 mmol/L (ref 3.5–5.1)
Sodium: 138 mmol/L (ref 135–145)
Total Bilirubin: 1 mg/dL (ref 0.3–1.2)
Total Protein: 4.4 g/dL — ABNORMAL LOW (ref 6.5–8.1)

## 2020-02-08 LAB — GLUCOSE, CAPILLARY
Glucose-Capillary: 188 mg/dL — ABNORMAL HIGH (ref 70–99)
Glucose-Capillary: 200 mg/dL — ABNORMAL HIGH (ref 70–99)
Glucose-Capillary: 237 mg/dL — ABNORMAL HIGH (ref 70–99)
Glucose-Capillary: 228 mg/dL — ABNORMAL HIGH (ref 70–99)
Glucose-Capillary: 260 mg/dL — ABNORMAL HIGH (ref 70–99)

## 2020-02-08 LAB — C-REACTIVE PROTEIN: CRP: 14.1 mg/dL — ABNORMAL HIGH (ref <1.0)

## 2020-02-08 LAB — SURGICAL PATHOLOGY

## 2020-02-08 LAB — D-DIMER, QUANTITATIVE: D-Dimer, Quant: 2.77 ug/mL-FEU — ABNORMAL HIGH (ref 0.00–0.50)

## 2020-02-08 LAB — BRAIN NATRIURETIC PEPTIDE: B Natriuretic Peptide: 41.5 pg/mL (ref 0.0–100.0)

## 2020-02-08 MED ORDER — PRO-STAT SUGAR FREE PO LIQD
30.0000 mL | Freq: Three times a day (TID) | ORAL | Status: DC
  Administered 2020-02-08 – 2020-02-12 (×11): 30 mL via ORAL
  Filled 2020-02-08 (×11): qty 30

## 2020-02-08 MED ORDER — FUROSEMIDE 40 MG PO TABS
40.0000 mg | ORAL_TABLET | Freq: Once | ORAL | Status: AC
  Administered 2020-02-08: 40 mg via ORAL
  Filled 2020-02-08: qty 1

## 2020-02-08 MED ORDER — DEXTROSE 5 % IV SOLN: INTRAVENOUS | Status: DC

## 2020-02-08 MED ORDER — BOOST / RESOURCE BREEZE PO LIQD CUSTOM
1.0000 | Freq: Three times a day (TID) | ORAL | Status: DC
  Administered 2020-02-08 – 2020-02-11 (×9): 1 via ORAL

## 2020-02-08 NOTE — Progress Notes (Addendum)
PROGRESS NOTE                                                                                                                                                                                                             Patient Demographics:    Bob Vazquez, is a 79 y.o. male, DOB - Aug 09, 1941, [place]:[id number]  Outpatient Primary MD for the patient is Nolene Ebbs, MD   Admit date - 01/30/2020   LOS - 10  Chief Complaint  Patient presents with  . Altered Mental Status  . Hyperglycemia       Brief Narrative: Patient is a 79 y.o. male with PMHx of DM-2, HTN, HLD, NPH-who was found very confused in an empty bathtub on a wellness check by law enforcement (family had not heard from him for several days)-he was subsequently brought to the ED-and was found to have acute metabolic encephalopathy in the setting of severe sepsis due to UTI, DKA and COVID-19 pneumonia.  He was admitted to the [facility] clinical stability transferred to the [facility] on 2/9.  Further hospital course has been complicated by odynophagia due to severe erosive esophagitis-acute blood loss anemia likely due to upper GI bleeding.  See below for further details.   Subjective:   Patient in bed, appears comfortable, denies any headache, no fever, no chest pain or pressure, no shortness of breath , no abdominal pain. No focal weakness.  No further dark stools, still has severe pain in swallowing food or liquids.   Assessment  & Plan :   Odynophagia: Continues to have severe odynophagia to both solids and liquids-in spite of being on Diflucan, PPI and Carafate.  Barium esophagogram on 2/8 showed some mild smooth narrowing in the distal esophagus.  EGD completed on 2/12-showed severe esophagitis and gastric ulcer with visible vessel/and a nonbleeding duodenal ulcer with an adherent clot.  Since no evidence of Candida esophagitis-stop Diflucan on 2/13.   Still has intense odynophagia will start on gentle IV fluids as he is n.p.o. due to severe pain.  Upper GI bleeding with acute blood loss anemia due to gastric ulcer with severe esophagitis and odynophagia: he s/p 2 units of packed RBC transfusion on 02/06/2020, still on IV PPI drip, his H&H has stabilized will continue to monitor.  Covid 19 Viral pneumonia: Stable on room air-has completed a  course of remdesivir-CRP slowly downtrending now-given GI bleeding-we will stop steroids-patient is on room air as well.  Clinically stable from the standpoint.  Asymptomatic on room air.  O2 requirements:  SpO2: 99 % O2 Flow Rate (L/min): Room air  COVID-19 Labs: Recent Labs    02/06/20 1020 02/07/20 0016 02/07/20 0626  DDIMER 1.27*  --  2.08*  FERRITIN 296 335  --   CRP 6.0* 4.4*  --     No results found for: BNP  No results for input(s): PROCALCITON in the last 168 hours.  Lab Results  Component Value Date   SARSCOV2NAA POSITIVE (A) 02/18/2020   [place] Not Detected 07/13/2019     COVID-19 Medications: Steroids: 2/5>> Remdesivir: 2/5>> 2/9  Severe sepsis secondary to UTI: Sepsis physiology has resolved-cultures positive for Klebsiella pneumonia and Streptococcus agalactiae-blood cultures negative.  Has completed a course of antimicrobial therapy.  Antibiotics: Ceftriaxone: 2/9>>2/11 Cefepime: 2/5>> 2/8  Acute metabolic encephalopathy: Secondary to DKA/AKI-improved-suspect patient not far from usual baseline.  AKI on CKD stage IIIb: AKI likely hemodynamically mediated-creatinine continues to slightly increase-likely secondary to poor oral intake due to odynophagia-an upper GI bleeding.  Bladder scans without significant urinary retention-follow for now.  Hyperkalemia: Slightly worse today-continue Lokelma-but will go over them give 1 dose of insulin/D50.  Repeat electrolytes tomorrow.    DKA: Resolved.  Unfortunately patient ran out of his insulin-and could not get it  refilled-likely provoking behavior along with Covid/UTI.  Insulin-dependent DM-2: CBG stable-continue SSI-as patient will be n.p.o-we will change Lantus to once daily dosing.  Lab Results  Component Value Date   HGBA1C 8.0 (H) 02/07/2020    Recent Labs    02/07/20 1604 02/07/20 2015 02/08/20 0138  GLUCAP 127* 212* 188*   HTN: Controlled-not on any antihypertensives.  HLD: Statins on hold-resume in the next few days.  Orthostatic Hypotension: probably secondary to dehydration/Diabetic neuropathy-retrospectively-may have been developing a GI bleeding that could have contributed to orthostatic hypotension.  All antihypertensives on hold-gentle IV fluid hydration in progress.   Deconditioning/debility: Secondary to acute illness-PT/OT following-recommendations of SNF-social work consulted.  Consults  :  PCCM,GI  Procedures  :    EGD -showing severe esophagitis and ulcer  Barium swallow -assistant with esophageal stricture.  Echocardiogram    1. Left ventricular ejection fraction, by visual estimation, is 60 to 65%. The left ventricle has normal function. There is severely increased  left ventricular hypertrophy of the basal septum.  2. Left ventricular diastolic parameters are consistent with Grade I diastolic dysfunction (impaired relaxation).  3. Elevated left ventricular end-diastolic pressure.  4. Global right ventricle has normal systolic function.The right ventricular size is normal. No increase in right ventricular wall thickness.  5. Left atrial size was normal.  6. Right atrial size was normal.  7. Mild to moderate mitral annular calcification.  8. The mitral valve is normal in structure. No evidence of mitral valve regurgitation. No evidence of mitral stenosis.  9. The tricuspid valve is normal in structure. Tricuspid valve regurgitation is not demonstrated.  10. The aortic valve is tricuspid. Aortic valve regurgitation is not visualized. Mild aortic valve  sclerosis without stenosis.  11. The pulmonic valve was normal in structure. Pulmonic valve regurgitation is not visualized.  12. The inferior vena cava is normal in size with greater than 50% respiratory variability, suggesting right atrial pressure of 3 mmHg.  13. TR signal is inadequate for assessing pulmonary artery systolic pressure.  14. Poor acoustical windows limit ability to adequately assess for  vegetation. No obvious massess on valve. Recommend TEE if clinically indicated.   Condition -extremely guarded  Family Communication  : Spoke at length with patient's niece-patient's brother is elderly and very hard to understand.  Explained tenuous clinical situation-DNR status-and if patient does not improve-potential for hospice on discharge.  Code Status : DNR-after discussion with patient-discussion started on 2/12-I followed up on 2/13.  Family-niece aware of patient's wishes.  Diet :  Diet Order            Diet clear liquid Room service appropriate? Yes; Fluid consistency: Thin  Diet effective now               Disposition Plan  : SNF once oral intake improves, currently n.p.o. due to intense odynophagia due to severe esophagitis and esophageal ulceration.  Antimicorbials  :    Anti-infectives (From admission, onward)   Start     Dose/Rate Route Frequency Ordered Stop   02/02/20 1400  fluconazole (DIFLUCAN) IVPB 100 mg  Status:  Discontinued     100 mg 50 mL/hr over 60 Minutes Intravenous Every 24 hours 02/02/20 1341 02/06/20 1148   02/02/20 1330  cefTRIAXone (ROCEPHIN) 1 g in sodium chloride 0.9 % 100 mL IVPB     1 g 200 mL/hr over 30 Minutes Intravenous Every 24 hours 02/02/20 1328 02/04/20 1234   01/30/20 1830  ceFEPIme (MAXIPIME) 2 g in sodium chloride 0.9 % 100 mL IVPB  Status:  Discontinued     2 g 200 mL/hr over 30 Minutes Intravenous Every 24 hours 02/16/2020 1927 02/02/20 1328   01/30/20 1000  remdesivir 100 mg in sodium chloride 0.9 % 100 mL IVPB     100 mg 200  mL/hr over 30 Minutes Intravenous Daily 02/03/2020 2258 02/02/20 1000   02/08/2020 2300  remdesivir 200 mg in sodium chloride 0.9% 250 mL IVPB     200 mg 580 mL/hr over 30 Minutes Intravenous Once 01/27/2020 2258 01/30/20 0102   02/17/2020 1929  vancomycin variable dose per unstable renal function (pharmacist dosing)  Status:  Discontinued      Does not apply See admin instructions 01/30/2020 1929 01/30/20 1429   02/14/2020 1730  ceFEPIme (MAXIPIME) 2 g in sodium chloride 0.9 % 100 mL IVPB     2 g 200 mL/hr over 30 Minutes Intravenous  Once 02/05/2020 1716 02/03/2020 1935   02/05/2020 1730  metroNIDAZOLE (FLAGYL) IVPB 500 mg     500 mg 100 mL/hr over 60 Minutes Intravenous  Once 02/14/2020 1716 01/31/2020 2000   02/10/2020 1730  vancomycin (VANCOCIN) IVPB 1000 mg/200 mL premix     1,000 mg 200 mL/hr over 60 Minutes Intravenous  Once 02/19/2020 1716 02/08/2020 2005     DVT prophylaxis.  SCDs added.   Inpatient Medications  Scheduled Meds: . insulin aspart  0-9 Units Subcutaneous Q6H  . magic mouthwash w/lidocaine  10 mL Oral TID  . [START ON 02/10/2020] pantoprazole  40 mg Intravenous Q12H  . sodium zirconium cyclosilicate  10 g Oral Daily  . sucralfate  1 g Oral TID WC & HS   Continuous Infusions: . dextrose 50 mL/hr at 02/07/20 1202  . pantoprozole (PROTONIX) infusion 8 mg/hr (02/08/20 0131)   PRN Meds:.   Time Spent in minutes  25  See all Orders from today for further details   Lala Lund M.D on 02/08/2020 at 10:01 AM  To page go to [email or web address] - use universal password  [facility] -  Office  [phone]  Objective:   Vitals:   02/08/20 0526 02/08/20 0752 02/08/20 0753 02/08/20 0800  BP: (!) 123/41 (!) 119/57  107/70  Pulse: 97 (!) 102 94   Resp: 18     Temp: 98.9 F (37.2 C)     TempSrc: Oral     SpO2: 90% 95%    Weight:      Height:        Wt Readings from Last 3 Encounters:  02/07/20 75.6 kg  04/12/14 81.6 kg  11/12/13 81.6 kg     Intake/Output Summary  (Last 24 hours) at 02/08/2020 1001 Last data filed at 02/08/2020 [id number] Gross per 24 hour  Intake 1482.1 ml  Output 800 ml  Net 682.1 ml     Physical Exam  Elderly week African-American male lying in hospital bed in no discomfort, awake Alert,   [place].AT,PERRAL Supple Neck,No JVD, No cervical lymphadenopathy appriciated.  Symmetrical Chest wall movement, Good air movement bilaterally, CTAB RRR,No Gallops, Rubs or new Murmurs, No Parasternal Heave +ve B.Sounds, Abd Soft, No tenderness, No organomegaly appriciated, No rebound - guarding or rigidity. No Cyanosis, Clubbing or edema, No new Rash or bruise    Data Review:    CBC Recent Labs  Lab 02/03/20 0431 02/03/20 0431 02/04/20 0437 02/10/2020 0305 02/06/20 1020 02/07/20 0016 02/07/20 0626  WBC 11.3*  --  12.5* 17.7* 16.2* 21.7*  --   HGB 11.4*   < > 9.7* 8.2* 6.1* 11.0* 10.6*  HCT 34.5*   < > 29.4* 25.6* 19.0* 31.5* 33.9*  PLT 364  --  291 357 285 255  --   MCV 83.9  --  84.5 86.2 87.6 84.7  --   MCH 27.7  --  27.9 27.6 28.1 29.6  --   MCHC 33.0  --  33.0 32.0 32.1 34.9  --   RDW 14.4  --  14.6 14.9 15.7* 15.2  --    < > = values in this interval not displayed.    Chemistries  Recent Labs  Lab 02/03/20 0431 02/03/20 0431 02/04/20 0437 02/21/2020 0305 02/06/20 1020 02/07/20 0016 02/07/20 0626  NA 139   < > 140 142 138 141 144  K 4.2   < > 5.1 5.2* 5.5* 4.8 5.0  CL 107   < > 108 111 116* 115* 117*  CO2 22   < > 21* 21* 15* 17* 15*  GLUCOSE 89   < > 66* 66* 121* 73 70  BUN 57*   < > 75* 84* 86* 75* 67*  CREATININE 1.99*   < > 2.22* 2.35* 2.60* 2.49* 2.26*  CALCIUM 8.2*   < > 8.1* 7.9* 7.1* 7.8* 7.9*  AST 38  --  38 32 26 34  --   ALT 27  --  23 21 20 24   --   ALKPHOS 60  --  53 50 36* 47  --   BILITOT 0.4  --  0.5 0.5 0.3 1.1  --    < > = values in this interval not displayed.   ------------------------------------------------------------------------------------------------------------------ No results for  input(s): CHOL, HDL, LDLCALC, TRIG, CHOLHDL, LDLDIRECT in the last 72 hours.  Lab Results  Component Value Date   HGBA1C 8.0 (H) 02/07/2020   ------------------------------------------------------------------------------------------------------------------ No results for input(s): TSH, T4TOTAL, T3FREE, THYROIDAB in the last 72 hours.  Invalid input(s): FREET3 ------------------------------------------------------------------------------------------------------------------ Recent Labs    02/06/20 1020 02/07/20 0016  FERRITIN 296 335    Coagulation profile No results for input(s): INR, PROTIME in the last  168 hours.  Recent Labs    02/06/20 1020 02/07/20 0626  DDIMER 1.27* 2.08*    Cardiac Enzymes No results for input(s): CKMB, TROPONINI, MYOGLOBIN in the last 168 hours.  Invalid input(s): CK ------------------------------------------------------------------------------------------------------------------ No results found for: BNP  Micro Results Recent Results (from the past 240 hour(s))  Culture, blood (routine x 2)     Status: None   Collection Time: 02/02/2020  5:03 PM   Specimen: BLOOD  Result Value Ref Range Status   Specimen Description BLOOD RIGHT ANTECUBITAL  Final   Special Requests   Final    BOTTLES DRAWN AEROBIC AND ANAEROBIC Blood Culture adequate volume   Culture   Final    NO GROWTH 5 DAYS Performed at [facility], [address], [place], [place] [address]    Report Status 02/03/2020 FINAL  Final  Culture, blood (routine x 2)     Status: None   Collection Time: 02/08/2020  5:08 PM   Specimen: BLOOD LEFT HAND  Result Value Ref Range Status   Specimen Description BLOOD LEFT HAND  Final   Special Requests   Final    BOTTLES DRAWN AEROBIC ONLY Blood Culture results may not be optimal due to an inadequate volume of blood received in culture bottles   Culture   Final    NO GROWTH 5 DAYS Performed at [facility], [place] [address]  [place], [place] [address]    Report Status 02/03/2020 FINAL  Final  Respiratory Panel by RT PCR (Flu A&B, Covid) - Nasopharyngeal Swab     Status: Abnormal   Collection Time: 02/12/2020  6:22 PM   Specimen: Nasopharyngeal Swab  Result Value Ref Range Status   SARS Coronavirus 2 by RT PCR POSITIVE (A) NEGATIVE Final    Comment: RESULT CALLED TO, READ BACK BY AND VERIFIED WITH: [place] 02/06/2020 A BROWNING (NOTE) SARS-CoV-2 target nucleic acids are DETECTED. SARS-CoV-2 RNA is generally detectable in upper respiratory specimens  during the acute phase of infection. Positive results are indicative of the presence of the identified virus, but do not rule out bacterial infection or co-infection with other pathogens not detected by the test. Clinical correlation with patient history and other diagnostic information is necessary to determine patient infection status. The expected result is Negative. Fact Sheet for Patients:  PinkCheek.be Fact Sheet for Healthcare Providers: GravelBags.it This test is not yet approved or cleared by the [place] FDA and  has been authorized for detection and/or diagnosis of SARS-CoV-2 by FDA under an Emergency Use Authorization (EUA).  This EUA will remain in effect (meaning this test can be used) f or the duration of  the COVID-19 declaration under Section 564(b)(1) of the Act, 21 U.S.C. section 360bbb-3(b)(1), unless the authorization is terminated or revoked sooner.    Influenza A by PCR NEGATIVE NEGATIVE Final   Influenza B by PCR NEGATIVE NEGATIVE Final    Comment: (NOTE) The Xpert Xpress SARS-CoV-2/FLU/RSV assay is intended as an aid in  the diagnosis of influenza from Nasopharyngeal swab specimens and  should not be used as a sole basis for treatment. Nasal washings and  aspirates are unacceptable for Xpert Xpress SARS-CoV-2/FLU/RSV  testing. Fact Sheet for  Patients: PinkCheek.be Fact Sheet for Healthcare Providers: GravelBags.it This test is not yet approved or cleared by the [place] FDA and  has been authorized for detection and/or diagnosis of SARS-CoV-2 by  FDA under an Emergency Use Authorization (EUA). This EUA will remain  in  effect (meaning this test can be used) for the duration of the  Covid-19 declaration under Section 564(b)(1) of the Act, 21  U.S.C. section 360bbb-3(b)(1), unless the authorization is  terminated or revoked. Performed at [facility], [place] [address], [place], [place] [address]   Urine culture     Status: Abnormal   Collection Time: 02/03/2020  8:06 PM   Specimen: In/Out Cath Urine  Result Value Ref Range Status   Specimen Description IN/OUT CATH URINE  Final   Special Requests NONE  Final   Culture (A)  Final    >=100,000 COLONIES/mL KLEBSIELLA PNEUMONIAE 80,000 COLONIES/mL STREPTOCOCCUS AGALACTIAE TESTING AGAINST S. AGALACTIAE NOT ROUTINELY PERFORMED DUE TO PREDICTABILITY OF AMP/PEN/VAN SUSCEPTIBILITY. Performed at [facility], [place] [address], [place], [place] [address]    Report Status 02/01/2020 FINAL  Final   Organism ID, Bacteria KLEBSIELLA PNEUMONIAE (A)  Final      Susceptibility   Klebsiella pneumoniae - MIC*    AMPICILLIN >=32 RESISTANT Resistant     CEFAZOLIN <=4 SENSITIVE Sensitive     CEFTRIAXONE 0.5 SENSITIVE Sensitive     CIPROFLOXACIN <=0.25 SENSITIVE Sensitive     GENTAMICIN <=1 SENSITIVE Sensitive     IMIPENEM <=0.25 SENSITIVE Sensitive     NITROFURANTOIN 32 SENSITIVE Sensitive     TRIMETH/SULFA <=20 SENSITIVE Sensitive     AMPICILLIN/SULBACTAM 8 SENSITIVE Sensitive     PIP/TAZO 8 SENSITIVE Sensitive     * >=100,000 COLONIES/mL KLEBSIELLA PNEUMONIAE    Radiology Reports CT Head Wo Contrast  Result Date: 01/28/2020 CLINICAL DATA:  Altered mental status EXAM: CT HEAD WITHOUT CONTRAST TECHNIQUE: Contiguous  axial images were obtained from the base of the skull through the vertex without intravenous contrast. COMPARISON:  12/23/2008 FINDINGS: Brain: No evidence of acute infarction, hemorrhage, hydrocephalus, extra-axial collection or mass lesion/mass effect. There is a right frontal approach VP shunt with tip terminating in the right lateral ventricle. There is encephalomalacia along the course of the VP shunt which has slightly worsened since the prior study. There is atrophy and chronic microvascular ischemic changes. There is a persistent 1 cm density at the left frontal horn periventricular white matter, stable from prior study. Vascular: No hyperdense vessel or unexpected calcification. Skull: Normal. Negative for fracture or focal lesion. Sinuses/Orbits: There is mucosal thickening of the maxillary sinuses and ethmoid air cells bilaterally. The remaining paranasal sinuses and mastoid air cells are essentially clear. Other: None. IMPRESSION: 1. No acute intracranial abnormality. 2. Chronic microvascular ischemic changes and atrophy which has progressed since prior study. 3. Well-positioned VP shunt. Electronically Signed   By: Constance Holster M.D.   On: 01/28/2020 19:32   DG Chest Port 1 View  Result Date: 02/07/2020 CLINICAL DATA:  Shortness of breath. EXAM: PORTABLE CHEST 1 VIEW COMPARISON:  January 29, 2020 FINDINGS: The mediastinal contour and cardiac silhouette are stable. The heart size is mildly enlarged. Mild patchy opacities are identified in both lung bases, slightly worsened compared prior exam. A VP shunt is identified unchanged. There is no pleural effusion. IMPRESSION: Mild patchy opacities are identified in both lung bases, developing pneumonias are not excluded. Electronically Signed   By: Abelardo Diesel M.D.   On: 02/07/2020 08:36   DG Chest Portable 1 View  Result Date: 02/06/2020 CLINICAL DATA:  Altered mental status EXAM: PORTABLE CHEST 1 VIEW COMPARISON:  09/08/2014 FINDINGS: VP shunt  traversing the right chest, continuous where seen. Prominent markings at the bases. No effusion, edema, or pneumothorax. Normal heart  size and mediastinal contours. IMPRESSION: Indistinct opacity at the lung bases could be atelectasis (lung volumes are low) or infectious infiltrates. Electronically Signed   By: Monte Fantasia M.D.   On: 02/21/2020 17:47   CT RENAL STONE STUDY  Result Date: 01/30/2020 CLINICAL DATA:  Pyelonephritis. EXAM: CT ABDOMEN AND PELVIS WITHOUT CONTRAST TECHNIQUE: Multidetector CT imaging of the abdomen and pelvis was performed following the standard protocol without IV contrast. COMPARISON:  April 06, 2014. FINDINGS: Lower chest: Multiple airspace opacities are noted in the visualized lung bases concerning for multifocal pneumonia. Hepatobiliary: No focal liver abnormality is seen. No gallstones, gallbladder wall thickening, or biliary dilatation. Pancreas: Unremarkable. No pancreatic ductal dilatation or surrounding inflammatory changes. Spleen: Normal in size without focal abnormality. Adrenals/Urinary Tract: Adrenal glands are unremarkable. Kidneys are normal, without renal calculi, focal lesion, or hydronephrosis. Bladder is unremarkable. Stomach/Bowel: Stomach is within normal limits. Appendix appears normal. No evidence of bowel wall thickening, distention, or inflammatory changes. Vascular/Lymphatic: Aortic atherosclerosis. No enlarged abdominal or pelvic lymph nodes. Reproductive: Prostate is unremarkable. Other: No abdominal wall hernia or abnormality. No abdominopelvic ascites. There is again noted right-sided ventriculoperitoneal shunt with distal tip in the right lower quadrant. Musculoskeletal: No acute or significant osseous findings. IMPRESSION: 1. Multiple airspace opacities are noted in the visualized lung bases concerning for multifocal pneumonia. 2. Aortic atherosclerosis. 3. No acute abnormality seen in the abdomen or pelvis. Aortic Atherosclerosis (ICD10-I70.0).  Electronically Signed   By: Marijo Conception M.D.   On: 01/30/2020 10:48   ECHOCARDIOGRAM LIMITED  Result Date: 01/31/2020   ECHOCARDIOGRAM REPORT   Patient Name:   DOMANI KIRVIN Date of Exam: 01/31/2020 Medical Rec #:  [id number]      Height:       70.0 in Accession #:    [id number]     Weight:       159.8 lb Date of Birth:  October 21, 1941      BSA:          1.90 m Patient Age:    48 years       BP:           147/68 mmHg Patient Gender: M              HR:           95 bpm. Exam Location:  Inpatient Procedure: Limited Echo Indications:    Fever 780.6/R50.9  History:        Patient has no prior history of Echocardiogram examinations.                 Risk Factors:Hypertension, Dyslipidemia and Diabetes.  Sonographer:    Clayton Lefort RDCS (AE) Referring Phys: [id number] [place]  1. Left ventricular ejection fraction, by visual estimation, is 60 to 65%. The left ventricle has normal function. There is severely increased left ventricular hypertrophy of the basal septum.  2. Left ventricular diastolic parameters are consistent with Grade I diastolic dysfunction (impaired relaxation).  3. Elevated left ventricular end-diastolic pressure.  4. Global right ventricle has normal systolic function.The right ventricular size is normal. No increase in right ventricular wall thickness.  5. Left atrial size was normal.  6. Right atrial size was normal.  7. Mild to moderate mitral annular calcification.  8. The mitral valve is normal in structure. No evidence of mitral valve regurgitation. No evidence of mitral stenosis.  9. The tricuspid valve is normal in structure. Tricuspid valve regurgitation is not demonstrated. 10. The aortic  valve is tricuspid. Aortic valve regurgitation is not visualized. Mild aortic valve sclerosis without stenosis. 11. The pulmonic valve was normal in structure. Pulmonic valve regurgitation is not visualized. 12. The inferior vena cava is normal in size with greater than 50% respiratory  variability, suggesting right atrial pressure of 3 mmHg. 13. TR signal is inadequate for assessing pulmonary artery systolic pressure. 14. Poor acoustical windows limit ability to adequately assess for vegetation. No obvious massess on valve. Recommend TEE if clinically indicated. FINDINGS  Left Ventricle: Left ventricular ejection fraction, by visual estimation, is 60 to 65%. The left ventricle has normal function. The left ventricle is not well visualized. There is severely increased left ventricular hypertrophy of the basal septum. Left  ventricular diastolic parameters are consistent with Grade I diastolic dysfunction (impaired relaxation). Elevated left ventricular end-diastolic pressure. Right Ventricle: The right ventricular size is normal. No increase in right ventricular wall thickness. Global RV systolic function is has normal systolic function. Left Atrium: Left atrial size was normal in size. Right Atrium: Right atrial size was normal in size Pericardium: There is no evidence of pericardial effusion. Mitral Valve: The mitral valve is normal in structure. Mild to moderate mitral annular calcification. No evidence of mitral valve regurgitation. No evidence of mitral valve stenosis by observation. Tricuspid Valve: The tricuspid valve is normal in structure. Tricuspid valve regurgitation is not demonstrated. Aortic Valve: The aortic valve is tricuspid. Aortic valve regurgitation is not visualized. Mild aortic valve sclerosis is present, with no evidence of aortic valve stenosis. Pulmonic Valve: The pulmonic valve was normal in structure. Pulmonic valve regurgitation is not visualized. Pulmonic regurgitation is not visualized. Aorta: The aortic root, ascending aorta and aortic arch are all structurally normal, with no evidence of dilitation or obstruction. Venous: The inferior vena cava is normal in size with greater than 50% respiratory variability, suggesting right atrial pressure of 3 mmHg. IAS/Shunts: No  atrial level shunt detected by color flow Doppler. There is no evidence of a patent foramen ovale. No ventricular septal defect is seen or detected. There is no evidence of an atrial septal defect.  LEFT VENTRICLE PLAX 2D LVIDd:         3.70 cm  Diastology LVIDs:         2.30 cm  LV e' lateral:   5.33 cm/s LV PW:         1.30 cm  LV E/e' lateral: 14.5 LV IVS:        1.60 cm  LV e' medial:    6.09 cm/s LVOT diam:     2.00 cm  LV E/e' medial:  12.7 LV SV:         40 ml LV SV Index:   21.12 LVOT Area:     3.14 cm  LEFT ATRIUM         Index LA diam:    2.70 cm 1.42 cm/m   AORTA Ao Root diam: 2.80 cm MITRAL VALVE MV Area (PHT): 7.99 cm              SHUNTS MV PHT:        27.55 msec            Systemic Diam: 2.00 cm MV Decel Time: 95 msec MV E velocity: 77.10 cm/s  103 cm/s MV A velocity: 118.00 cm/s 70.3 cm/s MV E/A ratio:  0.65        1.5  Fransico Him MD Electronically signed by Fransico Him MD Signature Date/Time: 01/31/2020/11:57:58 AM  Final    DG ESOPHAGUS W SINGLE CM (SOL OR THIN BA)  Result Date: 02/01/2020 CLINICAL DATA:  Mid chest pain and heartburn. Recent bedside speech pathology evaluation. COVID-19 infection. EXAM: SINGLE COLUMN BARIUM ENEMA TECHNIQUE: Initial scout AP supine abdominal image obtained to insure adequate colon cleansing. Barium was introduced into the colon in a retrograde fashion and refluxed from the rectum to the cecum. Spot images of the colon followed by overhead radiographs were obtained. FLUOROSCOPY TIME:  Fluoroscopy Time: 1 minutes and 0 seconds of low-dose pulsed fluoroscopy Radiation Exposure Index (if provided by the fluoroscopic device): 8.7 mGy Number of Acquired Spot Images: 0 COMPARISON:  Chest radiographs 02/03/2020. Abdominal CT 01/30/2020. FINDINGS: The study was performed in the supine and right lateral decubitus positions. The esophageal motility appears normal. No laryngeal penetration or abnormality of the cervical esophagus was demonstrated. There is mild smooth  narrowing of the distal esophageal lumen without mucosal ulceration. A 13 mm barium tablet was administered and was temporarily delayed in the cervical esophagus. This subsequently passed into the distal esophagus. Despite drinking water and thin barium, this tablet did not pass into the stomach during approximately 3 minutes of intermittent fluoroscopic observation with the patient supine and semi erect. IMPRESSION: 1. Mild smooth narrowing of the distal esophageal lumen, likely due to chronic reflux. No evidence of mucosal ulceration. 2. Barium tablet did not pass into the stomach during approximately 3 minutes of intermittent observation. Electronically Signed   By: Richardean Sale M.D.   On: 02/01/2020 14:25   Signature  Lala Lund M.D on 02/08/2020 at 10:01 AM   -  To page go to [email or web address]                                                                        PROGRESS NOTE                                                                                                                                                                                                             Patient Demographics:    Bob Vazquez, is a 79 y.o. male, DOB - 1941/08/28, [place]:[id number]  Outpatient Primary MD for the patient is Nolene Ebbs, MD   Admit date - 02/20/2020   LOS - 10  Chief  Complaint  Patient presents with  . Altered Mental Status  . Hyperglycemia       Brief Narrative: Patient is a 79 y.o. male with PMHx of DM-2, HTN, HLD, NPH-who was found very confused in an empty bathtub on a wellness check by law enforcement (family had not heard from him for several days)-he was subsequently brought to the ED-and was found to have acute metabolic encephalopathy in the setting of severe sepsis due to UTI, DKA and COVID-19 pneumonia.  He was admitted to the [facility] clinical stability transferred to the [facility] service on 2/9.  Further hospital course has been complicated by odynophagia due to  severe erosive esophagitis-acute blood loss anemia likely due to upper GI bleeding.  See below for further details.   Subjective:   Patient in bed, appears comfortable, denies any headache, no fever, no chest pain or pressure, no shortness of breath , no abdominal pain. No focal weakness.  His swallowing has improved somewhat.    Assessment  & Plan :   Odynophagia: Continues to have severe odynophagia to both solids and liquids-in spite of being on Diflucan, PPI and Carafate.  Barium esophagogram on 2/8 showed some mild smooth narrowing in the distal esophagus.  EGD completed on 2/12-showed severe esophagitis and gastric ulcer with visible vessel/and a nonbleeding duodenal ulcer with an adherent clot.  No evidence of Candida, Magic mouthwash with lidocaine added premeals with some improvement in his odynophagia, GI and speech following.  Upper GI bleeding with acute blood loss anemia: Hemoglobin has been trending down-initially thought to be due to acute illness-however due to EGD findings-high suspicion that this patient is likely having a upper GI bleeding.  Did drop H&H with evidence of acute anemia due to upper GI blood loss requiring 2 units of packed RBC on 02/06/2020, IV PPI, continue to monitor, H&H seems to have stabilized, will continue to follow labs.  Covid 19 Viral pneumonia: Stable on room air-has completed a course of remdesivir-CRP slowly downtrending now-given GI bleeding-we will stop steroids-patient is on room air as well.  Clinically stable from the standpoint.  Asymptomatic on room air.  O2 requirements:  SpO2: 99 % O2 Flow Rate (L/min): Room air  COVID-19 Labs: Recent Labs    02/06/20 1020 02/07/20 0016 02/07/20 0626  DDIMER 1.27*  --  2.08*  FERRITIN 296 335  --   CRP 6.0* 4.4*  --     No results found for: BNP  No results for input(s): PROCALCITON in the last 168 hours.  Lab Results  Component Value Date   SARSCOV2NAA POSITIVE (A) 02/17/2020   [place]  Not Detected 07/13/2019     COVID-19 Medications: Steroids: 2/5>> Remdesivir: 2/5>> 2/9  Severe sepsis secondary to UTI: Sepsis physiology has resolved-cultures positive for Klebsiella pneumonia and Streptococcus agalactiae-blood cultures negative.  Has completed a course of antimicrobial therapy.  Antibiotics: Ceftriaxone: 2/9>>2/11 Cefepime: 2/5>> 2/8  Acute metabolic encephalopathy: Secondary to DKA/AKI-improved-suspect patient not far from usual baseline.  AKI on CKD stage IIIb: AKI likely hemodynamically mediated-creatinine continues to slightly increase-likely secondary to poor oral intake due to odynophagia-an upper GI bleeding.  Bladder scans without significant urinary retention-follow for now.  Hyperkalemia: Slightly worse today-continue Lokelma-but will go over them give 1 dose of insulin/D50.  Repeat electrolytes tomorrow.    DKA: Resolved.  Unfortunately patient ran out of his insulin-and could not get it refilled-likely provoking behavior along with Covid/UTI.  Insulin-dependent DM-2: CBG stable-continue SSI-as patient will be n.p.o-we will change Lantus  to once daily dosing.  Lab Results  Component Value Date   HGBA1C 8.0 (H) 02/07/2020    Recent Labs    02/07/20 1604 02/07/20 2015 02/08/20 0138  GLUCAP 127* 212* 188*   HTN: Controlled-not on any antihypertensives.  HLD: Statins on hold-resume in the next few days.  Orthostatic Hypotension: probably secondary to dehydration/Diabetic neuropathy-retrospectively-may have been developing a GI bleeding that could have contributed to orthostatic hypotension.  All antihypertensives on hold-gentle IV fluid hydration in progress.   Deconditioning/debility: Secondary to acute illness-PT/OT following-recommendations of SNF-social work consulted.  Consults  :  PCCM,GI  Procedures  :    EGD -showing severe esophagitis and ulcer  Barium swallow -assistant with esophageal stricture.  Echocardiogram    1. Left  ventricular ejection fraction, by visual estimation, is 60 to 65%. The left ventricle has normal function. There is severely increased  left ventricular hypertrophy of the basal septum.  2. Left ventricular diastolic parameters are consistent with Grade I diastolic dysfunction (impaired relaxation).  3. Elevated left ventricular end-diastolic pressure.  4. Global right ventricle has normal systolic function.The right ventricular size is normal. No increase in right ventricular wall thickness.  5. Left atrial size was normal.  6. Right atrial size was normal.  7. Mild to moderate mitral annular calcification.  8. The mitral valve is normal in structure. No evidence of mitral valve regurgitation. No evidence of mitral stenosis.  9. The tricuspid valve is normal in structure. Tricuspid valve regurgitation is not demonstrated.  10. The aortic valve is tricuspid. Aortic valve regurgitation is not visualized. Mild aortic valve sclerosis without stenosis.  11. The pulmonic valve was normal in structure. Pulmonic valve regurgitation is not visualized.  12. The inferior vena cava is normal in size with greater than 50% respiratory variability, suggesting right atrial pressure of 3 mmHg.  13. TR signal is inadequate for assessing pulmonary artery systolic pressure.  14. Poor acoustical windows limit ability to adequately assess for vegetation. No obvious massess on valve. Recommend TEE if clinically indicated.   Condition - extremely guarded  Family Communication  : Updated patient's niece on 02/08/2020, they are aware of patient's wishes of being DNR and hospice if he declines.  Code Status : DNR  Diet :  Diet Order            Diet clear liquid Room service appropriate? Yes; Fluid consistency: Thin  Diet effective now               Disposition Plan  : SNF once oral intake improves, only minimal oral intake at this point due to intense odynophagia caused by severe esophagitis and  esophageal ulceration, still getting gentle IV fluids to improve AKI.  Antimicorbials  :    Anti-infectives (From admission, onward)   Start     Dose/Rate Route Frequency Ordered Stop   02/02/20 1400  fluconazole (DIFLUCAN) IVPB 100 mg  Status:  Discontinued     100 mg 50 mL/hr over 60 Minutes Intravenous Every 24 hours 02/02/20 1341 02/06/20 1148   02/02/20 1330  cefTRIAXone (ROCEPHIN) 1 g in sodium chloride 0.9 % 100 mL IVPB     1 g 200 mL/hr over 30 Minutes Intravenous Every 24 hours 02/02/20 1328 02/04/20 1234   01/30/20 1830  ceFEPIme (MAXIPIME) 2 g in sodium chloride 0.9 % 100 mL IVPB  Status:  Discontinued     2 g 200 mL/hr over 30 Minutes Intravenous Every 24 hours 02/16/2020 1927 02/02/20 1328   01/30/20  1000  remdesivir 100 mg in sodium chloride 0.9 % 100 mL IVPB     100 mg 200 mL/hr over 30 Minutes Intravenous Daily 02/17/2020 2258 02/02/20 1000   02/14/2020 2300  remdesivir 200 mg in sodium chloride 0.9% 250 mL IVPB     200 mg 580 mL/hr over 30 Minutes Intravenous Once 02/06/2020 2258 01/30/20 0102   02/08/2020 1929  vancomycin variable dose per unstable renal function (pharmacist dosing)  Status:  Discontinued      Does not apply See admin instructions 02/13/2020 1929 01/30/20 1429   02/18/2020 1730  ceFEPIme (MAXIPIME) 2 g in sodium chloride 0.9 % 100 mL IVPB     2 g 200 mL/hr over 30 Minutes Intravenous  Once 01/26/2020 1716 02/02/2020 1935   02/21/2020 1730  metroNIDAZOLE (FLAGYL) IVPB 500 mg     500 mg 100 mL/hr over 60 Minutes Intravenous  Once 01/28/2020 1716 02/03/2020 2000   01/27/2020 1730  vancomycin (VANCOCIN) IVPB 1000 mg/200 mL premix     1,000 mg 200 mL/hr over 60 Minutes Intravenous  Once 02/19/2020 1716 02/09/2020 2005     DVT prophylaxis.  SCDs added.   Inpatient Medications  Scheduled Meds: . insulin aspart  0-9 Units Subcutaneous Q6H  . magic mouthwash w/lidocaine  10 mL Oral TID  . [START ON 02/10/2020] pantoprazole  40 mg Intravenous Q12H  . sodium zirconium  cyclosilicate  10 g Oral Daily  . sucralfate  1 g Oral TID WC & HS   Continuous Infusions: . dextrose 50 mL/hr at 02/07/20 1202  . pantoprozole (PROTONIX) infusion 8 mg/hr (02/08/20 0131)   PRN Meds:.   Time Spent in minutes  25  See all Orders from today for further details   Lala Lund M.D on 02/08/2020 at 10:02 AM  To page go to [email or web address] - use universal password  [facility] -  Office  [phone]    Objective:   Vitals:   02/08/20 0526 02/08/20 0752 02/08/20 0753 02/08/20 0800  BP: (!) 123/41 (!) 119/57  107/70  Pulse: 97 (!) 102 94   Resp: 18     Temp: 98.9 F (37.2 C)     TempSrc: Oral     SpO2: 90% 95%    Weight:      Height:        Wt Readings from Last 3 Encounters:  02/07/20 75.6 kg  04/12/14 81.6 kg  11/12/13 81.6 kg     Intake/Output Summary (Last 24 hours) at 02/08/2020 1002 Last data filed at 02/08/2020 [id number] Gross per 24 hour  Intake 1482.1 ml  Output 800 ml  Net 682.1 ml     Physical Exam  Elderly week African-American male lying in hospital bed in no discomfort, awake Alert,   [place].AT,PERRAL Supple Neck,No JVD, No cervical lymphadenopathy appriciated.  Symmetrical Chest wall movement, Good air movement bilaterally, CTAB RRR,No Gallops, Rubs or new Murmurs, No Parasternal Heave +ve B.Sounds, Abd Soft, No tenderness, No organomegaly appriciated, No rebound - guarding or rigidity. No Cyanosis, Clubbing or edema, No new Rash or bruise    Data Review:    CBC Recent Labs  Lab 02/03/20 0431 02/03/20 0431 02/04/20 0437 02/05/20 0305 02/06/20 1020 02/07/20 0016 02/07/20 0626  WBC 11.3*  --  12.5* 17.7* 16.2* 21.7*  --   HGB 11.4*   < > 9.7* 8.2* 6.1* 11.0* 10.6*  HCT 34.5*   < > 29.4* 25.6* 19.0* 31.5* 33.9*  PLT 364  --  291 357 285 255  --  MCV 83.9  --  84.5 86.2 87.6 84.7  --   MCH 27.7  --  27.9 27.6 28.1 29.6  --   MCHC 33.0  --  33.0 32.0 32.1 34.9  --   RDW 14.4  --  14.6 14.9 15.7* 15.2  --    < > =  values in this interval not displayed.    Chemistries  Recent Labs  Lab 02/03/20 0431 02/03/20 0431 02/04/20 0437 02/12/2020 0305 02/06/20 1020 02/07/20 0016 02/07/20 0626  NA 139   < > 140 142 138 141 144  K 4.2   < > 5.1 5.2* 5.5* 4.8 5.0  CL 107   < > 108 111 116* 115* 117*  CO2 22   < > 21* 21* 15* 17* 15*  GLUCOSE 89   < > 66* 66* 121* 73 70  BUN 57*   < > 75* 84* 86* 75* 67*  CREATININE 1.99*   < > 2.22* 2.35* 2.60* 2.49* 2.26*  CALCIUM 8.2*   < > 8.1* 7.9* 7.1* 7.8* 7.9*  AST 38  --  38 32 26 34  --   ALT 27  --  23 21 20 24   --   ALKPHOS 60  --  53 50 36* 47  --   BILITOT 0.4  --  0.5 0.5 0.3 1.1  --    < > = values in this interval not displayed.   ------------------------------------------------------------------------------------------------------------------ No results for input(s): CHOL, HDL, LDLCALC, TRIG, CHOLHDL, LDLDIRECT in the last 72 hours.  Lab Results  Component Value Date   HGBA1C 8.0 (H) 02/07/2020   ------------------------------------------------------------------------------------------------------------------ No results for input(s): TSH, T4TOTAL, T3FREE, THYROIDAB in the last 72 hours.  Invalid input(s): FREET3 ------------------------------------------------------------------------------------------------------------------ Recent Labs    02/06/20 1020 02/07/20 0016  FERRITIN 296 335    Coagulation profile No results for input(s): INR, PROTIME in the last 168 hours.  Recent Labs    02/06/20 1020 02/07/20 0626  DDIMER 1.27* 2.08*    Cardiac Enzymes No results for input(s): CKMB, TROPONINI, MYOGLOBIN in the last 168 hours.  Invalid input(s): CK ------------------------------------------------------------------------------------------------------------------ No results found for: BNP  Micro Results Recent Results (from the past 240 hour(s))  Culture, blood (routine x 2)     Status: None   Collection Time: 02/05/2020  5:03 PM    Specimen: BLOOD  Result Value Ref Range Status   Specimen Description BLOOD RIGHT ANTECUBITAL  Final   Special Requests   Final    BOTTLES DRAWN AEROBIC AND ANAEROBIC Blood Culture adequate volume   Culture   Final    NO GROWTH 5 DAYS Performed at [facility], [address], [place], [place] [address]    Report Status 02/03/2020 FINAL  Final  Culture, blood (routine x 2)     Status: None   Collection Time: 02/20/2020  5:08 PM   Specimen: BLOOD LEFT HAND  Result Value Ref Range Status   Specimen Description BLOOD LEFT HAND  Final   Special Requests   Final    BOTTLES DRAWN AEROBIC ONLY Blood Culture results may not be optimal due to an inadequate volume of blood received in culture bottles   Culture   Final    NO GROWTH 5 DAYS Performed at [facility], [place] [address], [place], [place] [address]    Report Status 02/03/2020 FINAL  Final  Respiratory Panel by RT PCR (Flu A&B, Covid) - Nasopharyngeal Swab     Status: Abnormal   Collection  Time: 01/28/2020  6:22 PM   Specimen: Nasopharyngeal Swab  Result Value Ref Range Status   SARS Coronavirus 2 by RT PCR POSITIVE (A) NEGATIVE Final    Comment: RESULT CALLED TO, READ BACK BY AND VERIFIED WITH: Ruta Hinds RN [id number] 02/05/2020 A BROWNING (NOTE) SARS-CoV-2 target nucleic acids are DETECTED. SARS-CoV-2 RNA is generally detectable in upper respiratory specimens  during the acute phase of infection. Positive results are indicative of the presence of the identified virus, but do not rule out bacterial infection or co-infection with other pathogens not detected by the test. Clinical correlation with patient history and other diagnostic information is necessary to determine patient infection status. The expected result is Negative. Fact Sheet for Patients:  PinkCheek.be Fact Sheet for Healthcare Providers: GravelBags.it This test is not yet approved or cleared by the [place] FDA and  has been authorized for detection and/or diagnosis of SARS-CoV-2 by FDA under an Emergency Use Authorization (EUA).  This EUA will remain in effect (meaning this test can be used) f or the duration of  the COVID-19 declaration under Section 564(b)(1) of the Act, 21 U.S.C. section 360bbb-3(b)(1), unless the authorization is terminated or revoked sooner.    Influenza A by PCR NEGATIVE NEGATIVE Final   Influenza B by PCR NEGATIVE NEGATIVE Final    Comment: (NOTE) The Xpert Xpress SARS-CoV-2/FLU/RSV assay is intended as an aid in  the diagnosis of influenza from Nasopharyngeal swab specimens and  should not be used as a sole basis for treatment. Nasal washings and  aspirates are unacceptable for Xpert Xpress SARS-CoV-2/FLU/RSV  testing. Fact Sheet for Patients: PinkCheek.be Fact Sheet for Healthcare Providers: GravelBags.it This test is not yet approved or cleared by the [place] FDA and  has been authorized for detection and/or diagnosis of SARS-CoV-2 by  FDA under an Emergency Use Authorization (EUA). This EUA will remain  in effect (meaning this test can be used) for the duration of the  Covid-19 declaration under Section 564(b)(1) of the Act, 21  U.S.C. section 360bbb-3(b)(1), unless the authorization is  terminated or revoked. Performed at [facility], [place] [address], [place], [place] [address]   Urine culture     Status: Abnormal   Collection Time: 02/20/2020  8:06 PM   Specimen: In/Out Cath Urine  Result Value Ref Range Status   Specimen Description IN/OUT CATH URINE  Final   Special Requests NONE  Final   Culture (A)  Final    >=100,000 COLONIES/mL KLEBSIELLA PNEUMONIAE 80,000 COLONIES/mL STREPTOCOCCUS AGALACTIAE TESTING AGAINST S. AGALACTIAE NOT ROUTINELY PERFORMED DUE TO PREDICTABILITY OF AMP/PEN/VAN SUSCEPTIBILITY. Performed at [facility], [place] [address] [place],   [address]    Report Status 02/01/2020 FINAL  Final   Organism ID, Bacteria KLEBSIELLA PNEUMONIAE (A)  Final      Susceptibility   Klebsiella pneumoniae - MIC*    AMPICILLIN >=32 RESISTANT Resistant     CEFAZOLIN <=4 SENSITIVE Sensitive     CEFTRIAXONE 0.5 SENSITIVE Sensitive     CIPROFLOXACIN <=0.25 SENSITIVE Sensitive     GENTAMICIN <=1 SENSITIVE Sensitive     IMIPENEM <=0.25 SENSITIVE Sensitive     NITROFURANTOIN 32 SENSITIVE Sensitive     TRIMETH/SULFA <=20 SENSITIVE Sensitive     AMPICILLIN/SULBACTAM 8 SENSITIVE Sensitive     PIP/TAZO 8 SENSITIVE Sensitive     * >=100,000 COLONIES/mL KLEBSIELLA PNEUMONIAE    Radiology Reports CT Head Wo Contrast  Result Date: 02/03/2020 CLINICAL DATA:  Altered mental status EXAM:  CT HEAD WITHOUT CONTRAST TECHNIQUE: Contiguous axial images were obtained from the base of the skull through the vertex without intravenous contrast. COMPARISON:  12/23/2008 FINDINGS: Brain: No evidence of acute infarction, hemorrhage, hydrocephalus, extra-axial collection or mass lesion/mass effect. There is a right frontal approach VP shunt with tip terminating in the right lateral ventricle. There is encephalomalacia along the course of the VP shunt which has slightly worsened since the prior study. There is atrophy and chronic microvascular ischemic changes. There is a persistent 1 cm density at the left frontal horn periventricular white matter, stable from prior study. Vascular: No hyperdense vessel or unexpected calcification. Skull: Normal. Negative for fracture or focal lesion. Sinuses/Orbits: There is mucosal thickening of the maxillary sinuses and ethmoid air cells bilaterally. The remaining paranasal sinuses and mastoid air cells are essentially clear. Other: None. IMPRESSION: 1. No acute intracranial abnormality. 2. Chronic microvascular ischemic changes and atrophy which has progressed since prior study. 3. Well-positioned VP shunt. Electronically Signed   By: Constance Holster M.D.   On: 02/06/2020 19:32   DG Chest Port 1 View  Result Date: 02/07/2020 CLINICAL DATA:  Shortness of breath. EXAM: PORTABLE CHEST 1 VIEW COMPARISON:  January 29, 2020 FINDINGS: The mediastinal contour and cardiac silhouette are stable. The heart size is mildly enlarged. Mild patchy opacities are identified in both lung bases, slightly worsened compared prior exam. A VP shunt is identified unchanged. There is no pleural effusion. IMPRESSION: Mild patchy opacities are identified in both lung bases, developing pneumonias are not excluded. Electronically Signed   By: Abelardo Diesel M.D.   On: 02/07/2020 08:36   DG Chest Portable 1 View  Result Date: 02/13/2020 CLINICAL DATA:  Altered mental status EXAM: PORTABLE CHEST 1 VIEW COMPARISON:  09/08/2014 FINDINGS: VP shunt traversing the right chest, continuous where seen. Prominent markings at the bases. No effusion, edema, or pneumothorax. Normal heart size and mediastinal contours. IMPRESSION: Indistinct opacity at the lung bases could be atelectasis (lung volumes are low) or infectious infiltrates. Electronically Signed   By: Monte Fantasia M.D.   On: 02/12/2020 17:47   CT RENAL STONE STUDY  Result Date: 01/30/2020 CLINICAL DATA:  Pyelonephritis. EXAM: CT ABDOMEN AND PELVIS WITHOUT CONTRAST TECHNIQUE: Multidetector CT imaging of the abdomen and pelvis was performed following the standard protocol without IV contrast. COMPARISON:  April 06, 2014. FINDINGS: Lower chest: Multiple airspace opacities are noted in the visualized lung bases concerning for multifocal pneumonia. Hepatobiliary: No focal liver abnormality is seen. No gallstones, gallbladder wall thickening, or biliary dilatation. Pancreas: Unremarkable. No pancreatic ductal dilatation or surrounding inflammatory changes. Spleen: Normal in size without focal abnormality. Adrenals/Urinary Tract: Adrenal glands are unremarkable. Kidneys are normal, without renal calculi, focal lesion, or  hydronephrosis. Bladder is unremarkable. Stomach/Bowel: Stomach is within normal limits. Appendix appears normal. No evidence of bowel wall thickening, distention, or inflammatory changes. Vascular/Lymphatic: Aortic atherosclerosis. No enlarged abdominal or pelvic lymph nodes. Reproductive: Prostate is unremarkable. Other: No abdominal wall hernia or abnormality. No abdominopelvic ascites. There is again noted right-sided ventriculoperitoneal shunt with distal tip in the right lower quadrant. Musculoskeletal: No acute or significant osseous findings. IMPRESSION: 1. Multiple airspace opacities are noted in the visualized lung bases concerning for multifocal pneumonia. 2. Aortic atherosclerosis. 3. No acute abnormality seen in the abdomen or pelvis. Aortic Atherosclerosis (ICD10-I70.0). Electronically Signed   By: Marijo Conception M.D.   On: 01/30/2020 10:48   ECHOCARDIOGRAM LIMITED  Result Date: 01/31/2020   ECHOCARDIOGRAM REPORT   Patient Name:  Thompson Grayer Date of Exam: 01/31/2020 Medical Rec #:  [id number]      Height:       70.0 in Accession #:    [id number]     Weight:       159.8 lb Date of Birth:  1941-10-23      BSA:          1.90 m Patient Age:    60 years       BP:           147/68 mmHg Patient Gender: M              HR:           95 bpm. Exam Location:  Inpatient Procedure: Limited Echo Indications:    Fever 780.6/R50.9  History:        Patient has no prior history of Echocardiogram examinations.                 Risk Factors:Hypertension, Dyslipidemia and Diabetes.  Sonographer:    Clayton Lefort RDCS (AE) Referring Phys: [id number] [place]  1. Left ventricular ejection fraction, by visual estimation, is 60 to 65%. The left ventricle has normal function. There is severely increased left ventricular hypertrophy of the basal septum.  2. Left ventricular diastolic parameters are consistent with Grade I diastolic dysfunction (impaired relaxation).  3. Elevated left ventricular end-diastolic  pressure.  4. Global right ventricle has normal systolic function.The right ventricular size is normal. No increase in right ventricular wall thickness.  5. Left atrial size was normal.  6. Right atrial size was normal.  7. Mild to moderate mitral annular calcification.  8. The mitral valve is normal in structure. No evidence of mitral valve regurgitation. No evidence of mitral stenosis.  9. The tricuspid valve is normal in structure. Tricuspid valve regurgitation is not demonstrated. 10. The aortic valve is tricuspid. Aortic valve regurgitation is not visualized. Mild aortic valve sclerosis without stenosis. 11. The pulmonic valve was normal in structure. Pulmonic valve regurgitation is not visualized. 12. The inferior vena cava is normal in size with greater than 50% respiratory variability, suggesting right atrial pressure of 3 mmHg. 13. TR signal is inadequate for assessing pulmonary artery systolic pressure. 14. Poor acoustical windows limit ability to adequately assess for vegetation. No obvious massess on valve. Recommend TEE if clinically indicated. FINDINGS  Left Ventricle: Left ventricular ejection fraction, by visual estimation, is 60 to 65%. The left ventricle has normal function. The left ventricle is not well visualized. There is severely increased left ventricular hypertrophy of the basal septum. Left  ventricular diastolic parameters are consistent with Grade I diastolic dysfunction (impaired relaxation). Elevated left ventricular end-diastolic pressure. Right Ventricle: The right ventricular size is normal. No increase in right ventricular wall thickness. Global RV systolic function is has normal systolic function. Left Atrium: Left atrial size was normal in size. Right Atrium: Right atrial size was normal in size Pericardium: There is no evidence of pericardial effusion. Mitral Valve: The mitral valve is normal in structure. Mild to moderate mitral annular calcification. No evidence of mitral valve  regurgitation. No evidence of mitral valve stenosis by observation. Tricuspid Valve: The tricuspid valve is normal in structure. Tricuspid valve regurgitation is not demonstrated. Aortic Valve: The aortic valve is tricuspid. Aortic valve regurgitation is not visualized. Mild aortic valve sclerosis is present, with no evidence of aortic valve stenosis. Pulmonic Valve: The pulmonic valve was normal in structure. Pulmonic valve regurgitation is not visualized.  Pulmonic regurgitation is not visualized. Aorta: The aortic root, ascending aorta and aortic arch are all structurally normal, with no evidence of dilitation or obstruction. Venous: The inferior vena cava is normal in size with greater than 50% respiratory variability, suggesting right atrial pressure of 3 mmHg. IAS/Shunts: No atrial level shunt detected by color flow Doppler. There is no evidence of a patent foramen ovale. No ventricular septal defect is seen or detected. There is no evidence of an atrial septal defect.  LEFT VENTRICLE PLAX 2D LVIDd:         3.70 cm  Diastology LVIDs:         2.30 cm  LV e' lateral:   5.33 cm/s LV PW:         1.30 cm  LV E/e' lateral: 14.5 LV IVS:        1.60 cm  LV e' medial:    6.09 cm/s LVOT diam:     2.00 cm  LV E/e' medial:  12.7 LV SV:         40 ml LV SV Index:   21.12 LVOT Area:     3.14 cm  LEFT ATRIUM         Index LA diam:    2.70 cm 1.42 cm/m   AORTA Ao Root diam: 2.80 cm MITRAL VALVE MV Area (PHT): 7.99 cm              SHUNTS MV PHT:        27.55 msec            Systemic Diam: 2.00 cm MV Decel Time: 95 msec MV E velocity: 77.10 cm/s  103 cm/s MV A velocity: 118.00 cm/s 70.3 cm/s MV E/A ratio:  0.65        1.5  Fransico Him MD Electronically signed by Fransico Him MD Signature Date/Time: 01/31/2020/11:57:58 AM    Final    DG ESOPHAGUS W SINGLE CM (SOL OR THIN BA)  Result Date: 02/01/2020 CLINICAL DATA:  Mid chest pain and heartburn. Recent bedside speech pathology evaluation. COVID-19 infection. EXAM: SINGLE  COLUMN BARIUM ENEMA TECHNIQUE: Initial scout AP supine abdominal image obtained to insure adequate colon cleansing. Barium was introduced into the colon in a retrograde fashion and refluxed from the rectum to the cecum. Spot images of the colon followed by overhead radiographs were obtained. FLUOROSCOPY TIME:  Fluoroscopy Time: 1 minutes and 0 seconds of low-dose pulsed fluoroscopy Radiation Exposure Index (if provided by the fluoroscopic device): 8.7 mGy Number of Acquired Spot Images: 0 COMPARISON:  Chest radiographs 02/20/2020. Abdominal CT 01/30/2020. FINDINGS: The study was performed in the supine and right lateral decubitus positions. The esophageal motility appears normal. No laryngeal penetration or abnormality of the cervical esophagus was demonstrated. There is mild smooth narrowing of the distal esophageal lumen without mucosal ulceration. A 13 mm barium tablet was administered and was temporarily delayed in the cervical esophagus. This subsequently passed into the distal esophagus. Despite drinking water and thin barium, this tablet did not pass into the stomach during approximately 3 minutes of intermittent fluoroscopic observation with the patient supine and semi erect. IMPRESSION: 1. Mild smooth narrowing of the distal esophageal lumen, likely due to chronic reflux. No evidence of mucosal ulceration. 2. Barium tablet did not pass into the stomach during approximately 3 minutes of intermittent observation. Electronically Signed   By: Richardean Sale M.D.   On: 02/01/2020 14:25   Signature  Lala Lund M.D on 02/08/2020 at 10:02 AM   -  To page go to [email or web address]

## 2020-02-08 NOTE — Anesthesia Postprocedure Evaluation (Signed)
Anesthesia Post Note  Patient: Bob Vazquez  Procedure(s) Performed: ESOPHAGOGASTRODUODENOSCOPY (EGD) WITH PROPOFOL (N/A ) HOT HEMOSTASIS (ARGON PLASMA COAGULATION/BICAP) (N/A ) BIOPSY HEMOSTASIS CLIP PLACEMENT SCLEROTHERAPY     Patient location during evaluation: Endoscopy Anesthesia Type: MAC Level of consciousness: awake and alert Pain management: pain level controlled Vital Signs Assessment: post-procedure vital signs reviewed and stable Respiratory status: spontaneous breathing, nonlabored ventilation and respiratory function stable Cardiovascular status: blood pressure returned to baseline and stable Postop Assessment: no apparent nausea or vomiting Anesthetic complications: no    Last Vitals:  Vitals:   02/08/20 0140 02/08/20 0526  BP:  (!) 123/41  Pulse:  97  Resp:  18  Temp:  37.2 C  SpO2: 99% 90%    Last Pain:  Vitals:   02/08/20 0526  TempSrc: Oral  PainSc:                  [name]

## 2020-02-08 NOTE — Progress Notes (Signed)
Nutrition Follow-up   RD working remotely.  DOCUMENTATION CODES:   Not applicable  INTERVENTION:  Continue Boost Breeze po TID, each supplement provides 250 kcal and 9 grams of protein.  Continue 30 ml Prostat po TID, each supplement provides 100 kcal and 15 grams of protein.   Encourage adequate PO intake.   NUTRITION DIAGNOSIS:   Increased nutrient needs related to catabolic [id number]) as evidenced by estimated needs; ongoing  GOAL:   Patient will meet greater than or equal to 90% of their needs; progressing  MONITOR:   Diet advancement, PO intake, Supplement acceptance, Labs, Weight trends, I & O's  REASON FOR ASSESSMENT:   Malnutrition Screening Tool    ASSESSMENT:   79 year old male with PMHx of HTN, HLD, chronic bronchitis, DM type 2 admitted withacute metabolic encephalopathy in the setting of severe sepsis due to UTI, DKA and COVID-19 pneumonia, also with odynophagia due to severe erosive esophagitis.  Diet advanced to a soft diet with thin liquids. Per SLP, pt swallowing improving. Boost Breeze and Prostat has been ordered to aid in caloric and protein needs. RD to continue with current orders. Labs and medications reviewed.   Diet Order:   Diet Order            DIET SOFT Room service appropriate? Yes; Fluid consistency: Thin  Diet effective now              EDUCATION NEEDS:   No education needs have been identified at this time  Skin:  Skin Assessment: Reviewed RN Assessment  Last BM:  2/13  Height:   Ht Readings from Last 1 Encounters:  01/30/20 5\' 10"  (1.778 m)    Weight:   Wt Readings from Last 1 Encounters:  02/07/20 75.6 kg    Ideal Body Weight:  75.5 kg  BMI:  Body mass index is 23.91 kg/m.  Estimated Nutritional Needs:   Kcal:  2000-2200  Protein:  100-110 grams  Fluid:  2 L/day   Corrin Parker, MS, RD, LDN RD pager number/after hours weekend pager number on Amion.

## 2020-02-08 NOTE — Progress Notes (Addendum)
  Speech Language Pathology Treatment: Dysphagia  Patient Details Name: Bob Vazquez MRN: [id number] DOB: 09/13/1941 Today's Date: 02/08/2020 Time: 3335-4562 SLP Time Calculation (min) (ACUTE ONLY): 17 min  Assessment / Plan / Recommendation Clinical Impression  Since last seen by SLP, pt has undergone esophageal w/u with findings updated in HPI.  He continues with odynophagia, now explained by severe esophagitis.  Today, pt was participatory and pleasant; endorsed that warm liquids are more comfortable to swallow than cold.  Offered hot tea, which he consumed with fewer c/o of pain - he states that pain with swallowing is not as severe, and he did not grip his chest or grimace as frequently.  Given source of odynophagia has been identified, and there is not a mechanical oropharyngeal dysphagia nor concerns for aspiration, SLP service will sign off.  Discussed PO options to improve comfort.  Diet to be managed by MD> No further SLP needed.   HPI HPI: Bob Vazquez is a 79 year old male with a history of type 2 diabetes, hypertension, hyperlipidemia, NPH who lives alone and family had not heard from him in several days.  Law enforcement did a wellness check and found patient in an empty bathtub.  No signs of trauma.  Patient has been awake though confused and was found to have UTI, likely sepsis and DKA and also Covid-19 positive. Barium esophagram on 2/8 showed some mild smooth narrowing in the distal esophagus.  EGD completed on 2/12-showed severe esophagitis and gastric ulcer with visible vessel/and a nonbleeding duodenal ulcer with an adherent clot.  Pt has ongoing severe odynophagia to both solids and liquids.        SLP Plan  All goals met       Recommendations  Diet recommendations: Other(comment)(Per MD) Medication Administration: Crushed with puree Supervision: Patient able to self feed Compensations: Slow rate;Small sips/bites;Follow solids with liquid Postural Changes and/or Swallow  Maneuvers: Seated upright 90 degrees;Upright 30-60 min after meal;Out of bed for meals                Plan: All goals met       GO               Bob Vazquez L. Bob Vazquez, Bob Vazquez Office number [phone] Pager [phone]  Bob Vazquez 02/08/2020, 2:27 PM

## 2020-02-09 ENCOUNTER — Inpatient Hospital Stay (HOSPITAL_COMMUNITY): Payer: Medicare Other | Admitting: Certified Registered"

## 2020-02-09 ENCOUNTER — Encounter (HOSPITAL_COMMUNITY): Admission: EM | Disposition: E | Payer: Self-pay | Source: Home / Self Care | Attending: Internal Medicine

## 2020-02-09 ENCOUNTER — Inpatient Hospital Stay (HOSPITAL_COMMUNITY): Payer: Medicare Other

## 2020-02-09 ENCOUNTER — Encounter (HOSPITAL_COMMUNITY): Payer: Self-pay | Admitting: Specialist

## 2020-02-09 HISTORY — PX: ESOPHAGOGASTRODUODENOSCOPY (EGD) WITH PROPOFOL: SHX5813

## 2020-02-09 LAB — COMPREHENSIVE METABOLIC PANEL
ALT: 23 U/L (ref 0–44)
AST: 26 U/L (ref 15–41)
Albumin: 1.7 g/dL — ABNORMAL LOW (ref 3.5–5.0)
Alkaline Phosphatase: 43 U/L (ref 38–126)
Anion gap: 8 (ref 5–15)
BUN: 42 mg/dL — ABNORMAL HIGH (ref 8–23)
CO2: 19 mmol/L — ABNORMAL LOW (ref 22–32)
Calcium: 7.3 mg/dL — ABNORMAL LOW (ref 8.9–10.3)
Chloride: 107 mmol/L (ref 98–111)
Creatinine, Ser: 2.35 mg/dL — ABNORMAL HIGH (ref 0.61–1.24)
GFR calc Af Amer: 29 mL/min — ABNORMAL LOW (ref >60)
GFR calc non Af Amer: 25 mL/min — ABNORMAL LOW (ref >60)
Glucose, Bld: 280 mg/dL — ABNORMAL HIGH (ref 70–99)
Potassium: 3.9 mmol/L (ref 3.5–5.1)
Sodium: 134 mmol/L — ABNORMAL LOW (ref 135–145)
Total Bilirubin: 1 mg/dL (ref 0.3–1.2)
Total Protein: 4.8 g/dL — ABNORMAL LOW (ref 6.5–8.1)

## 2020-02-09 LAB — CBC WITH DIFFERENTIAL/PLATELET
Abs Immature Granulocytes: 0.33 10*3/uL — ABNORMAL HIGH (ref 0.00–0.07)
Basophils Absolute: 0 10*3/uL (ref 0.0–0.1)
Basophils Relative: 0 %
Eosinophils Absolute: 0 10*3/uL (ref 0.0–0.5)
Eosinophils Relative: 0 %
HCT: 24.1 % — ABNORMAL LOW (ref 39.0–52.0)
Hemoglobin: 7.9 g/dL — ABNORMAL LOW (ref 13.0–17.0)
Immature Granulocytes: 3 %
Lymphocytes Relative: 8 %
Lymphs Abs: 0.9 10*3/uL (ref 0.7–4.0)
MCH: 29.2 pg (ref 26.0–34.0)
MCHC: 32.8 g/dL (ref 30.0–36.0)
MCV: 88.9 fL (ref 80.0–100.0)
Monocytes Absolute: 0.7 10*3/uL (ref 0.1–1.0)
Monocytes Relative: 6 %
Neutro Abs: 9.9 10*3/uL — ABNORMAL HIGH (ref 1.7–7.7)
Neutrophils Relative %: 83 %
Platelets: 204 10*3/uL (ref 150–400)
RBC: 2.71 MIL/uL — ABNORMAL LOW (ref 4.22–5.81)
RDW: 16.4 % — ABNORMAL HIGH (ref 11.5–15.5)
WBC: 12 10*3/uL — ABNORMAL HIGH (ref 4.0–10.5)
nRBC: 0.7 % — ABNORMAL HIGH (ref 0.0–0.2)

## 2020-02-09 LAB — PREPARE RBC (CROSSMATCH)

## 2020-02-09 LAB — GLUCOSE, CAPILLARY
Glucose-Capillary: 267 mg/dL — ABNORMAL HIGH (ref 70–99)
Glucose-Capillary: 222 mg/dL — ABNORMAL HIGH (ref 70–99)
Glucose-Capillary: 172 mg/dL — ABNORMAL HIGH (ref 70–99)

## 2020-02-09 LAB — HEMOGLOBIN AND HEMATOCRIT, BLOOD
HCT: 27.3 % — ABNORMAL LOW (ref 39.0–52.0)
Hemoglobin: 8.9 g/dL — ABNORMAL LOW (ref 13.0–17.0)

## 2020-02-09 LAB — D-DIMER, QUANTITATIVE: D-Dimer, Quant: 3.79 ug/mL-FEU — ABNORMAL HIGH (ref 0.00–0.50)

## 2020-02-09 LAB — C-REACTIVE PROTEIN: CRP: 17 mg/dL — ABNORMAL HIGH (ref <1.0)

## 2020-02-09 LAB — MAGNESIUM: Magnesium: 1.9 mg/dL (ref 1.7–2.4)

## 2020-02-09 LAB — BRAIN NATRIURETIC PEPTIDE: B Natriuretic Peptide: 45.2 pg/mL (ref 0.0–100.0)

## 2020-02-09 SURGERY — ESOPHAGOGASTRODUODENOSCOPY (EGD) WITH PROPOFOL
Anesthesia: Monitor Anesthesia Care

## 2020-02-09 MED ORDER — SODIUM CHLORIDE 0.9% IV SOLUTION: Freq: Once | INTRAVENOUS | Status: AC

## 2020-02-09 MED ORDER — PHENYLEPHRINE HCL-NACL 10-0.9 MG/250ML-% IV SOLN
INTRAVENOUS | Status: DC | PRN
  Administered 2020-02-09: 50 ug/min via INTRAVENOUS

## 2020-02-09 MED ORDER — DEXTROSE-NACL 5-0.45 % IV SOLN: INTRAVENOUS | Status: DC

## 2020-02-09 MED ORDER — PROPOFOL 500 MG/50ML IV EMUL
INTRAVENOUS | Status: DC | PRN
  Administered 2020-02-09: 75 ug/kg/min via INTRAVENOUS

## 2020-02-09 MED ORDER — ALBUTEROL SULFATE HFA 108 (90 BASE) MCG/ACT IN AERS
INHALATION_SPRAY | RESPIRATORY_TRACT | Status: DC | PRN
  Administered 2020-02-09: 2 via RESPIRATORY_TRACT

## 2020-02-09 MED ORDER — IPRATROPIUM-ALBUTEROL 0.5-2.5 (3) MG/3ML IN SOLN
RESPIRATORY_TRACT | Status: AC
  Filled 2020-02-09: qty 3

## 2020-02-09 MED ORDER — IPRATROPIUM-ALBUTEROL 20-100 MCG/ACT IN AERS
INHALATION_SPRAY | RESPIRATORY_TRACT | Status: DC | PRN
  Administered 2020-02-09: 1 via RESPIRATORY_TRACT

## 2020-02-09 MED ORDER — SODIUM CHLORIDE 0.9 % IV SOLN
8.0000 mg/h | INTRAVENOUS | Status: AC
  Administered 2020-02-09 – 2020-02-12 (×5): 8 mg/h via INTRAVENOUS
  Filled 2020-02-09 (×10): qty 80

## 2020-02-09 MED ORDER — LACTATED RINGERS IV SOLN: INTRAVENOUS | Status: DC

## 2020-02-09 MED ORDER — SODIUM CHLORIDE 0.9 % IV SOLN: INTRAVENOUS | Status: DC | PRN

## 2020-02-09 SURGICAL SUPPLY — 15 items

## 2020-02-09 NOTE — Progress Notes (Signed)
Occupational Therapy Treatment Patient Details Name: Bob Vazquez MRN: [id number] DOB: [birth year] Today's Date: 02/13/2020    History of present illness 79 year old male with a history of type 2 diabetes, hypertension, hyperlipidemia, NPH (with VP shunt) who lives alone and family had not heard from him in several days.  Law enforcement did a wellness check 02/18/2020 and found patient in an empty bathtub.  No signs of trauma.  Patient has been awake though confused and was found to have UTI, likely sepsis and DKA and also Covid-19 positive. Progress with therapy limited by orthostasis. The patient is apparently cared for by his nephew who is hospitalized at [facility] with Covid-19. s/p 2/12 upper GI endoscopy and GI bleed identified.   OT comments  PTA, pt lived alone and was Independent with ADLs/mobility. Pt making gradual progress towards OT goals. Pt limited by decreased strength, endurance, and orthostatic hypotension impacting safety with daily tasks. Pt received in bed and agreeable to participate in therapy. Guided pt in bed mobility to sit EOB at Mod A with assistance needed to advance B LE and tactile cues for hand placement. While sitting EOB, pt min guard for safety with BP at 101/49. Guided pt in one sit to stand transfer with RW with BP reading at 93/48. Deferred transfer for safety on this date. Pt Max A to return to supine due to dizziness and progressive lethargy. Pt BP increased after returning to supine at 126/70 and 136/58. DC recommendations remain appropriate. Will continue to follow acutely.    Follow Up Recommendations  SNF;Supervision/Assistance - 24 hour    Equipment Recommendations  3 in 1 bedside commode;Other (comment)    Recommendations for Other Services      Precautions / Restrictions Precautions Precautions: Fall;Other (comment)(Airborne) Restrictions Weight Bearing Restrictions: No       Mobility Bed Mobility Overal bed mobility: Needs Assistance Bed Mobility:  Supine to Sit;Sit to Supine     Supine to sit: Mod assist Sit to supine: Max assist   General bed mobility comments: Pt with increased lethargy, requiring assistance to advance B LEs   Transfers Overall transfer level: Needs assistance Equipment used: Rolling walker (2 wheeled) Transfers: Sit to/from Stand Sit to Stand: Mod assist;+2 physical assistance;+2 safety/equipment         General transfer comment: Pt Mod A for Sit to stand using RW. Dizziness reported before and after this transition. Deferred pivot transfer for safety     Balance Overall balance assessment: Needs assistance Sitting-balance support: Bilateral upper extremity supported;Feet supported Sitting balance-Leahy Scale: Fair     Standing balance support: Bilateral upper extremity supported Standing balance-Leahy Scale: Poor                             ADL either performed or assessed with clinical judgement   ADL Overall ADL's : Needs assistance/impaired                                     Functional mobility during ADLs: Moderate assistance;+2 for physical assistance;+2 for safety/equipment;Rolling walker General ADL Comments: pt with impaired cognition, weakness, and with limitations due to orthostatic BP with functional transfers     Vision       Perception     Praxis      Cognition Arousal/Alertness: Lethargic Behavior During Therapy: WFL for tasks assessed/performed Overall Cognitive Status: No family/caregiver present  to determine baseline cognitive functioning                                 General Comments: Follows one step commands        Exercises     Shoulder Instructions       General Comments Pt with dizziness with each postural change. Initial BP at 123/55 at rest, 101/49 after transition to EOB. Pt after standing at BP at 93/48 after this trial. Deferred transfer for safety. After in bed, BP at 126/70 increasing to 136/58 3-5  minutes after returning to supine    Pertinent Vitals/ Pain       Pain Assessment: Faces Faces Pain Scale: Hurts little more Pain Location: R arm (from blood transfusion site) Pain Descriptors / Indicators: Burning Pain Intervention(s): Monitored during session;Other (comment)(RN present and assisting in completion of blood transfusion)  Home Living                                          Prior Functioning/Environment              Frequency  Min 2X/week        Progress Toward Goals  OT Goals(current goals can now be found in the care plan section)  Progress towards OT goals: Progressing toward goals  Acute Rehab OT Goals Patient Stated Goal: get stronger OT Goal Formulation: With patient Time For Goal Achievement: 02-22-20 Potential to Achieve Goals: Good ADL Goals Pt Will Perform Grooming: with supervision;sitting;standing Pt Will Perform Lower Body Bathing: with supervision;sitting/lateral leans;sit to/from stand Pt Will Perform Upper Body Dressing: with set-up;with supervision;sitting Pt Will Perform Lower Body Dressing: with supervision;sit to/from stand;sitting/lateral leans Pt Will Transfer to Toilet: with supervision;ambulating Pt Will Perform Toileting - Clothing Manipulation and hygiene: with supervision;sit to/from stand;sitting/lateral leans Additional ADL Goal #1: Pt will demonstrate anticipatory awareness during ADL/mobility completion.  Plan Discharge plan remains appropriate    Co-evaluation    PT/OT/SLP Co-Evaluation/Treatment: Yes Reason for Co-Treatment: Complexity of the patient's impairments (multi-system involvement);For patient/therapist safety   OT goals addressed during session: Other (comment);ADL's and self-care(Safety with ADL transfers)      AM-PAC OT "6 Clicks" Daily Activity     Outcome Measure   Help from another person eating meals?: A Little Help from another person taking care of personal grooming?: A  Little Help from another person toileting, which includes using toliet, bedpan, or urinal?: A Lot Help from another person bathing (including washing, rinsing, drying)?: A Lot Help from another person to put on and taking off regular upper body clothing?: A Little Help from another person to put on and taking off regular lower body clothing?: A Lot 6 Click Score: 15    End of Session Equipment Utilized During Treatment: Gait belt;Rolling walker  OT Visit Diagnosis: Muscle weakness (generalized) (M62.81);Unsteadiness on feet (R26.81);Other symptoms and signs involving cognitive function   Activity Tolerance Patient limited by fatigue;Patient tolerated treatment well   Patient Left in bed;with bed alarm set;with call bell/phone within reach   Nurse Communication Other (comment)(BP readings. RN present at end of session)        Time: 1242-1309 OT Time Calculation (min): 27 min  Charges: OT General Charges $OT Visit: 1 Visit OT Treatments $Therapeutic Activity: 8-22 mins  Layla Maw, OTR/L   Layla Maw  02/13/2020, 2:28 PM

## 2020-02-09 NOTE — Transfer of Care (Signed)
Immediate Anesthesia Transfer of Care Note  Patient: Bob Vazquez  Procedure(s) Performed: ESOPHAGOGASTRODUODENOSCOPY (EGD) WITH PROPOFOL (N/A )  Patient Location: Endoscopy Unit  Anesthesia Type:MAC  Level of Consciousness: awake, alert  and oriented  Airway & Oxygen Therapy: Patient Spontanous Breathing and non-rebreather face mask  Post-op Assessment: Report given to RN and Post -op Vital signs reviewed and stable  Post vital signs: Reviewed and stable  Last Vitals:  Vitals Value Taken Time  BP 116/52 02/04/2020 1523  Temp 36.2 C 02/12/2020 1513  Pulse 101 02/19/2020 1523  Resp 20 02/01/2020 1523  SpO2 100 % 02/17/2020 1523    Last Pain:  Vitals:   02/21/2020 1523  TempSrc:   PainSc: 0-No pain      Patients Stated Pain Goal: 0 (50/15/86 8257)  Complications: respiratory complications

## 2020-02-09 NOTE — Anesthesia Preprocedure Evaluation (Addendum)
Anesthesia Evaluation  Patient identified by MRN, date of birth, ID band Patient awake    Reviewed: Allergy & Precautions, H&P , NPO status , Patient's Chart, lab work & pertinent test results  History of Anesthesia Complications Negative for: history of anesthetic complications  Airway Mallampati: II  TM Distance: >3 FB Neck ROM: Full    Dental  (+) Dental Advisory Given   Pulmonary neg pulmonary ROS,    Pulmonary exam normal        Cardiovascular Exercise Tolerance: Good hypertension, Pt. on medications Normal cardiovascular exam  IMPRESSIONS     1. Left ventricular ejection fraction, by visual estimation, is 60 to 65%. The left ventricle has normal function. There is severely increased left ventricular hypertrophy of the basal septum. 2. Left ventricular diastolic parameters are consistent with Grade I diastolic dysfunction (impaired relaxation). 3. Elevated left ventricular end-diastolic pressure. 4. Global right ventricle has normal systolic function.The right ventricular size is normal. No increase in right ventricular wall thickness. 5. Left atrial size was normal. 6. Right atrial size was normal. 7. Mild to moderate mitral annular calcification. 8. The mitral valve is normal in structure. No evidence of mitral valve regurgitation. No evidence of mitral stenosis. 9. The tricuspid valve is normal in structure. Tricuspid valve regurgitation is not demonstrated. 10. The aortic valve is tricuspid. Aortic valve regurgitation is not visualized. Mild aortic valve sclerosis without stenosis. 11. The pulmonic valve was normal in structure. Pulmonic valve regurgitation is not visualized. 12. The inferior vena cava is normal in size with greater than 50% respiratory variability, suggesting right atrial pressure of 3 mmHg. 13. TR signal is inadequate for assessing pulmonary artery systolic pressure. 14. Poor acoustical windows limit  ability to adequately assess for vegetation. No obvious massess on valve. Recommend TEE if clinically indicated.   Neuro/Psych negative neurological ROS  negative psych ROS   GI/Hepatic negative GI ROS, Neg liver ROS,   Endo/Other  negative endocrine ROSdiabetes, Type 2, Oral Hypoglycemic Agents  Renal/GU Renal Insufficiencynegative Renal ROS  negative genitourinary   Musculoskeletal negative musculoskeletal ROS (+)   Abdominal   Peds negative pediatric ROS (+)  Hematology negative hematology ROS (+)   Anesthesia Other Findings Day of surgery medications reviewed with the patient.  Reproductive/Obstetrics negative OB ROS                            Anesthesia Physical  Anesthesia Plan  ASA: III  Anesthesia Plan: MAC   Post-op Pain Management:    Induction: Intravenous  PONV Risk Score and Plan: 1 and Treatment may vary due to age or medical condition and Propofol infusion  Airway Management Planned: Nasal Cannula  Additional Equipment:   Intra-op Plan:   Post-operative Plan: Extubation in OR  Informed Consent: I have reviewed the patients History and Physical, chart, labs and discussed the procedure including the risks, benefits and alternatives for the proposed anesthesia with the patient or authorized representative who has indicated his/her understanding and acceptance.     Dental advisory given  Plan Discussed with: CRNA and Anesthesiologist  Anesthesia Plan Comments:        Anesthesia Quick Evaluation

## 2020-02-09 NOTE — TOC Progression Note (Signed)
Transition of Care [facility]) - Progression Note    Patient Details  Name: Bob Vazquez MRN: [id number] Date of Birth: 1941/07/12  [facility] CM/SW [place], [place] Phone Number: 01/27/2020, 1:50 PM  Clinical Narrative:    CSW updated [employer]. Will need new insurance approval once patient is medically stable for SNF.    Expected Discharge Plan: [place] Barriers to Discharge: Continued Medical Work up  Expected Discharge Plan and Services Expected Discharge Plan: [place] arrangements for the past 2 months: Single Family Home                                       Social Determinants of Health (SDOH) Interventions    Readmission Risk Interventions No flowsheet data found.

## 2020-02-09 NOTE — Progress Notes (Signed)
Inpatient Diabetes Program Recommendations  AACE/ADA: New Consensus Statement on Inpatient Glycemic Control (2015)  Target Ranges:  Prepandial:   less than 140 mg/dL      Peak postprandial:   less than 180 mg/dL (1-2 hours)      Critically ill patients:  140 - 180 mg/dL   Lab Results  Component Value Date   GLUCAP 267 (H) 02/14/2020   HGBA1C 8.0 (H) 02/07/2020    Review of Glycemic Control Results for Bob Vazquez, Bob Vazquez (MRN [id number]) as of 02/11/2020 10:59  Ref. Range 02/08/2020 11:13 02/08/2020 12:23 02/08/2020 15:57 02/08/2020 20:40 02/07/2020 06:19  Glucose-Capillary Latest Ref Range: 70 - 99 mg/dL 200 (H) 237 (H) 228 (H) 260 (H) 267 (H)   Inpatient Diabetes Program Recommendations:   Please consider portion of home basal dose.  Thank you, Nani Gasser. Sarie Stall, RN, MSN, CDE  Diabetes Coordinator Inpatient Glycemic Control Team Team Pager [phone] (8am-5pm) 02/17/2020 11:00 AM

## 2020-02-09 NOTE — Progress Notes (Signed)
Call placed to Dr. Candiss Norse to inform him of patient unable to wean from non-rebreather following EGD. Advised patient is awake and sitting up in bed. Respirations are not labored at this time. Per Dr. Candiss Norse patient may return to [phone] and he will follow him there.

## 2020-02-09 NOTE — Op Note (Signed)
[facility] Patient Name: Bob Vazquez Procedure Date : 02/01/2020 MRN: [id number] Attending MD: Carol Ada , MD Date of Birth: 10-03-1941 CSN: [id number] Age: 79 Admit Type: Inpatient Procedure:                Upper GI endoscopy Indications:              Iron deficiency anemia, Melena Providers:                Carol Ada, MD, Jeanella Cara, RN, Cherylynn Ridges, Technician, Eugenia Mcalpine, CRNA Referring MD:              Medicines:                Propofol per Anesthesia Complications:            No immediate complications. Estimated Blood Loss:     Estimated blood loss: none. Procedure:                Pre-Anesthesia Assessment:                           - Prior to the procedure, a History and Physical                            was performed, and patient medications and                            allergies were reviewed. The patient's tolerance of                            previous anesthesia was also reviewed. The risks                            and benefits of the procedure and the sedation                            options and risks were discussed with the patient.                            All questions were answered, and informed consent                            was obtained. Prior Anticoagulants: The patient has                            taken no previous anticoagulant or antiplatelet                            agents. ASA Grade Assessment: III - A patient with                            severe systemic disease. After reviewing the risks  and benefits, the patient was deemed in                            satisfactory condition to undergo the procedure.                           - Sedation was administered by an anesthesia                            professional. Deep sedation was attained.                           After obtaining informed consent, the endoscope was   passed under direct vision. Throughout the                            procedure, the patient's blood pressure, pulse, and                            oxygen saturations were monitored continuously. The                            [id number] Olympus pediatric colonoscope                            was introduced through the mouth, and advanced to                            the fourth part of duodenum. The upper GI endoscopy                            was accomplished without difficulty. The patient                            tolerated the procedure well. Scope In: Scope Out: Findings:      LA Grade D (one or more mucosal breaks involving at least 75% of       esophageal circumference) esophagitis with no bleeding was found in the       lower third of the esophagus.      One non-bleeding cratered gastric ulcer with no stigmata of bleeding was       found in the prepyloric region of the stomach. The lesion was 10 mm in       largest dimension.      Four non-bleeding cratered and superficial duodenal ulcers with no       stigmata of bleeding were found in the duodenal bulb and in the second       portion of the duodenum. The largest lesion was 8 mm in largest       dimension.      The patient's esophagitis is improving. Previously there was       inflammation noted in the entire esophagus and now it is limited to the       distal esophagus. The prepyloric ulcer was noted to be healing. There       was no evidence of any visible vessel and it was smaller as well  as       being more superficial. In the duodenal bulb the original ulcer noted       did not have an adherent clot. Only one of the hemoclips remained in       place. Two additional superfical ulcerations were noted in the bulb and       one in the proximal portion of D2. It is unclear if these were missed       during the initial EGD, which is most likely the case, or new       ulcerations. Visualization of this area was  more difficult as there was       recent bleeding, which did obscure visualization. The current findings       all show an improvement and there was no evidence of any bleeding. Impression:               - LA Grade D reflux esophagitis with no bleeding.                           - Non-bleeding gastric ulcer with no stigmata of                            bleeding.                           - Non-bleeding duodenal ulcers with no stigmata of                            bleeding.                           - No specimens collected. Recommendation:           - Return patient to hospital ward for ongoing care.                           - Resume regular diet.                           - Continue present medications. Procedure Code(s):        --- Professional ---                           [phone], Esophagogastroduodenoscopy, flexible,                            transoral; diagnostic, including collection of                            specimen(s) by brushing or washing, when performed                            (separate procedure) Diagnosis Code(s):        --- Professional ---                           K21.00, Gastro-esophageal reflux disease with  esophagitis, without bleeding                           K25.9, Gastric ulcer, unspecified as acute or                            chronic, without hemorrhage or perforation                           K26.9, Duodenal ulcer, unspecified as acute or                            chronic, without hemorrhage or perforation                           D50.9, Iron deficiency anemia, unspecified                           K92.1, Melena (includes Hematochezia) CPT copyright 2019 [employer]. All rights reserved. The codes documented in this report are preliminary and upon coder review may  be revised to meet current compliance requirements. Carol Ada, MD Carol Ada, MD 01/30/2020 2:55:15 PM This report has been signed  electronically. Number of Addenda: 0

## 2020-02-09 NOTE — Anesthesia Postprocedure Evaluation (Signed)
Anesthesia Post Note  Patient: CEASER EBELING  Procedure(s) Performed: ESOPHAGOGASTRODUODENOSCOPY (EGD) WITH PROPOFOL (N/A )     Patient location during evaluation: Endoscopy Anesthesia Type: MAC Level of consciousness: awake and alert Pain management: pain level controlled Vital Signs Assessment: post-procedure vital signs reviewed and stable Respiratory status: spontaneous breathing, respiratory function stable and non-rebreather facemask Cardiovascular status: stable and blood pressure returned to baseline Postop Assessment: no apparent nausea or vomiting Anesthetic complications: no    Last Vitals:  Vitals:   01/31/2020 1544 02/16/2020 1553  BP: (!) 110/98   Pulse: 94 97  Resp: 18   Temp:    SpO2: 100% 96%    Last Pain:  Vitals:   01/31/2020 1532  TempSrc:   PainSc: 0-No pain                 Kenedy Haisley,Cassiel DANIEL

## 2020-02-09 NOTE — Anesthesia Procedure Notes (Signed)
Procedure Name: MAC Date/Time: 02/11/2020 2:35 PM Performed by: Griffin Dakin, CRNA Pre-anesthesia Checklist: Patient identified, Emergency Drugs available, Suction available, Patient being monitored and Timeout performed Patient Re-evaluated:Patient Re-evaluated prior to induction Oxygen Delivery Method: Nasal cannula Induction Type: IV induction

## 2020-02-09 NOTE — Progress Notes (Signed)
Physical Therapy Treatment Patient Details Name: Bob Vazquez MRN: [id number] DOB: 08/07/1941 Today's Date: 02/14/2020    History of Present Illness 79 year old male with a history of type 2 diabetes, hypertension, hyperlipidemia, NPH (with VP shunt) who lives alone and family had not heard from him in several days.  Law enforcement did a wellness check 02/12/2020 and found patient in an empty bathtub.  No signs of trauma.  Patient has been awake though confused and was found to have UTI, likely sepsis and DKA and also Covid-19 positive. Progress with therapy limited by orthostasis. The patient is apparently cared for by his nephew who is hospitalized at [facility] with Covid-19. s/p 2/12 upper GI endoscopy and GI bleed identified.    PT Comments    Pt in bed receiving a blood transfusion on entry, but eager to get out of bed. Unfortunately pt continues to be limited in safe mobility by orthostatic hypotension, in presence of decreased strength and endurance. Pt is mod-maxA for bed mobility, modAx2 for transfers and sidestepping 3 feet with RW. Pt did not have TED hose in room as they had become soiled, RN to order a new pair prior to next session.  D/c plans remain appropriate at this time. PT will continue to follow acutely.  Orthostatic BPs  Supine 123/55  Sitting 101/49  After standing (pt unable to maintain standing due to dizziness while BP being measured) 92/48  supine 126/70  Supine after 3 minutes 136/58      Follow Up Recommendations  SNF     Equipment Recommendations  Other (comment)(TBD)       Precautions / Restrictions Precautions Precautions: Fall;Other (comment)(Airborne) Restrictions Weight Bearing Restrictions: No    Mobility  Bed Mobility Overal bed mobility: Needs Assistance Bed Mobility: Supine to Sit;Sit to Supine     Supine to sit: Mod assist Sit to supine: Max assist   General bed mobility comments: Pt with increased lethargy, requiring assistance to advance  B LEs   Transfers Overall transfer level: Needs assistance Equipment used: Rolling walker (2 wheeled) Transfers: Sit to/from Stand Sit to Stand: Mod assist;+2 physical assistance;+2 safety/equipment         General transfer comment: Pt Mod A for Sit to stand using RW. Dizziness reported before and after this transition. Deferred pivot transfer for safety   Ambulation/Gait Ambulation/Gait assistance: Mod assist;+2 physical assistance Gait Distance (Feet): 3 Feet Assistive device: Rolling walker (2 wheeled) Gait Pattern/deviations: Step-to pattern;Decreased step length - right;Decreased step length - left Gait velocity: slowed Gait velocity interpretation: <1.31 ft/sec, indicative of household ambulator General Gait Details: pt with c/o of dizziness but willing to attempt side stepping toward HoB       Balance Overall balance assessment: Needs assistance Sitting-balance support: Bilateral upper extremity supported;Feet supported Sitting balance-Leahy Scale: Fair     Standing balance support: Bilateral upper extremity supported Standing balance-Leahy Scale: Poor                              Cognition Arousal/Alertness: Lethargic Behavior During Therapy: WFL for tasks assessed/performed Overall Cognitive Status: No family/caregiver present to determine baseline cognitive functioning                                 General Comments: Follows one step commands      Exercises      General Comments General comments (skin integrity,  edema, etc.): Pt with dizziness with each postural change. Initial BP at 123/55 at rest, 101/49 after transition to EOB. Pt after standing at BP at 93/48 after this trial. Deferred transfer for safety. After in bed, BP at 126/70 increasing to 136/58 3-5 minutes after returning to supine      Pertinent Vitals/Pain Pain Assessment: Faces Faces Pain Scale: Hurts little more Pain Location: R arm (from blood transfusion  site) Pain Descriptors / Indicators: Burning Pain Intervention(s): Monitored during session(RN present and assisting in completion of blood transfusion)           PT Goals (current goals can now be found in the care plan section) Acute Rehab PT Goals Patient Stated Goal: get stronger PT Goal Formulation: With patient Time For Goal Achievement: 03-03-2020 Potential to Achieve Goals: Good Progress towards PT goals: Not progressing toward goals - comment(orthostasis)    Frequency    Min 2X/week      PT Plan Current plan remains appropriate    Co-evaluation PT/OT/SLP Co-Evaluation/Treatment: Yes Reason for Co-Treatment: Complexity of the patient's impairments (multi-system involvement) PT goals addressed during session: Balance OT goals addressed during session: Other (comment);ADL's and self-care(Safety with ADL transfers)      AM-PAC PT "6 Clicks" Mobility   Outcome Measure  Help needed turning from your back to your side while in a flat bed without using bedrails?: A Little Help needed moving from lying on your back to sitting on the side of a flat bed without using bedrails?: A Lot Help needed moving to and from a bed to a chair (including a wheelchair)?: Total Help needed standing up from a chair using your arms (e.g., wheelchair or bedside chair)?: Total Help needed to walk in hospital room?: Total Help needed climbing 3-5 steps with a railing? : Total 6 Click Score: 9    End of Session Equipment Utilized During Treatment: Gait belt Activity Tolerance: Treatment limited secondary to medical complications (Comment)(orthostatics ) Patient left: in bed;with call bell/phone within reach;with bed alarm set Nurse Communication: Mobility status;Other (comment)(orthostasic hypotension ) PT Visit Diagnosis: Unsteadiness on feet (R26.81);Muscle weakness (generalized) (M62.81)     Time: [id number] PT Time Calculation (min) (ACUTE ONLY): 27 min  Charges:  $Gait Training:  8-22 mins                     Jionni Helming B. Migdalia Dk PT, DPT Acute Rehabilitation Services Pager [phone] Office [phone]    [place] 01/25/2020, 4:14 PM

## 2020-02-09 NOTE — Progress Notes (Signed)
PROGRESS NOTE                                                                                                                                                                                                             Patient Demographics:    Bob Vazquez, is a 79 y.o. male, DOB - 02-25-1941, [place]:[id number]  Outpatient Primary MD for the patient is Nolene Ebbs, MD   Admit date - 01/31/2020   LOS - 67  Chief Complaint  Patient presents with  . Altered Mental Status  . Hyperglycemia       Brief Narrative: Patient is a 79 y.o. male with PMHx of DM-2, HTN, HLD, NPH-who was found very confused in an empty bathtub on a wellness check by law enforcement (family had not heard from him for several days)-he was subsequently brought to the ED-and was found to have acute metabolic encephalopathy in the setting of severe sepsis due to UTI, DKA and COVID-19 pneumonia.  He was admitted to the [facility] clinical stability transferred to the [facility] service on 2/9.  Further hospital course has been complicated by odynophagia due to severe erosive esophagitis-acute blood loss anemia likely due to upper GI bleeding.  See below for further details.   Subjective:   Patient in bed, appears comfortable, denies any headache, no fever, no chest pain or pressure, no shortness of breath , no abdominal pain. No focal weakness.  No further dark stools, still has severe pain in swallowing food or liquids.   Assessment  & Plan :   Odynophagia: Continues to have severe odynophagia to both solids and liquids-in spite of being on Diflucan, PPI and Carafate.  Barium esophagogram on 2/8 showed some mild smooth narrowing in the distal esophagus.  EGD completed on 2/12-showed severe esophagitis and gastric ulcer with visible vessel/and a nonbleeding duodenal ulcer with an adherent clot.  Since no evidence of Candida esophagitis-stop Diflucan on 2/13.   Still has intense odynophagia will start on gentle IV fluids as he is n.p.o. due to severe pain.  Upper GI bleeding with acute blood loss anemia due to gastric ulcer with severe esophagitis and odynophagia: he s/p 2 units of packed RBC transfusion on 02/06/2020, still on IV PPI drip, his H&H has stabilized will continue to monitor.  Covid 19 Viral pneumonia: Stable on room air-has completed a  course of remdesivir-CRP slowly downtrending now-given GI bleeding-we will stop steroids-patient is on room air as well.  Clinically stable from the standpoint.  Asymptomatic on room air.  O2 requirements:  SpO2: 99 % O2 Flow Rate (L/min): Room air  COVID-19 Labs: Recent Labs    02/07/20 0016 02/07/20 0626 02/08/20 1200 02/17/2020 0350  DDIMER  --  2.08* 2.77* 3.79*  FERRITIN 335  --   --   --   CRP 4.4*  --  14.1* 17.0*       Component Value Date/Time   BNP 45.2 02/08/2020 0351    No results for input(s): PROCALCITON in the last 168 hours.  Lab Results  Component Value Date   SARSCOV2NAA POSITIVE (A) 02/11/2020   [place] Not Detected 07/13/2019     COVID-19 Medications: Steroids: 2/5>> Remdesivir: 2/5>> 2/9  Severe sepsis secondary to UTI: Sepsis physiology has resolved-cultures positive for Klebsiella pneumonia and Streptococcus agalactiae-blood cultures negative.  Has completed a course of antimicrobial therapy.  Antibiotics: Ceftriaxone: 2/9>>2/11 Cefepime: 2/5>> 2/8  Acute metabolic encephalopathy: Secondary to DKA/AKI-improved-suspect patient not far from usual baseline.  AKI on CKD stage IIIb: AKI likely hemodynamically mediated-creatinine continues to slightly increase-likely secondary to poor oral intake due to odynophagia-an upper GI bleeding.  Bladder scans without significant urinary retention-follow for now.  Hyperkalemia: Slightly worse today-continue Lokelma-but will go over them give 1 dose of insulin/D50.  Repeat electrolytes tomorrow.    DKA: Resolved.   Unfortunately patient ran out of his insulin-and could not get it refilled-likely provoking behavior along with Covid/UTI.  Insulin-dependent DM-2: CBG stable-continue SSI-as patient will be n.p.o-we will change Lantus to once daily dosing.  Lab Results  Component Value Date   HGBA1C 8.0 (H) 02/07/2020    Recent Labs    02/08/20 1557 02/08/20 2040 02/14/2020 0619  GLUCAP 228* 260* 267*   HTN: Controlled-not on any antihypertensives.  HLD: Statins on hold-resume in the next few days.  Orthostatic Hypotension: probably secondary to dehydration/Diabetic neuropathy-retrospectively-may have been developing a GI bleeding that could have contributed to orthostatic hypotension.  All antihypertensives on hold-gentle IV fluid hydration in progress.   Deconditioning/debility: Secondary to acute illness-PT/OT following-recommendations of SNF-social work consulted.  Consults  :  PCCM,GI  Procedures  :    EGD -showing severe esophagitis and ulcer  Barium swallow -assistant with esophageal stricture.  Echocardiogram    1. Left ventricular ejection fraction, by visual estimation, is 60 to 65%. The left ventricle has normal function. There is severely increased  left ventricular hypertrophy of the basal septum.  2. Left ventricular diastolic parameters are consistent with Grade I diastolic dysfunction (impaired relaxation).  3. Elevated left ventricular end-diastolic pressure.  4. Global right ventricle has normal systolic function.The right ventricular size is normal. No increase in right ventricular wall thickness.  5. Left atrial size was normal.  6. Right atrial size was normal.  7. Mild to moderate mitral annular calcification.  8. The mitral valve is normal in structure. No evidence of mitral valve regurgitation. No evidence of mitral stenosis.  9. The tricuspid valve is normal in structure. Tricuspid valve regurgitation is not demonstrated.  10. The aortic valve is  tricuspid. Aortic valve regurgitation is not visualized. Mild aortic valve sclerosis without stenosis.  11. The pulmonic valve was normal in structure. Pulmonic valve regurgitation is not visualized.  12. The inferior vena cava is normal in size with greater than 50% respiratory variability, suggesting right atrial pressure of 3 mmHg.  13. TR signal is  inadequate for assessing pulmonary artery systolic pressure.  14. Poor acoustical windows limit ability to adequately assess for vegetation. No obvious massess on valve. Recommend TEE if clinically indicated.   Condition -extremely guarded  Family Communication  : Spoke at length with patient's niece-patient's brother is elderly and very hard to understand.  Explained tenuous clinical situation-DNR status-and if patient does not improve-potential for hospice on discharge.  Code Status : DNR-after discussion with patient-discussion started on 2/12-I followed up on 2/13.  Family-niece aware of patient's wishes.  Diet :  Diet Order            Diet NPO time specified  Diet effective now               Disposition Plan  : SNF once oral intake improves, currently n.p.o. due to intense odynophagia due to severe esophagitis and esophageal ulceration.  Antimicorbials  :    Anti-infectives (From admission, onward)   Start     Dose/Rate Route Frequency Ordered Stop   02/02/20 1400  fluconazole (DIFLUCAN) IVPB 100 mg  Status:  Discontinued     100 mg 50 mL/hr over 60 Minutes Intravenous Every 24 hours 02/02/20 1341 02/06/20 1148   02/02/20 1330  cefTRIAXone (ROCEPHIN) 1 g in sodium chloride 0.9 % 100 mL IVPB     1 g 200 mL/hr over 30 Minutes Intravenous Every 24 hours 02/02/20 1328 02/04/20 1234   01/30/20 1830  ceFEPIme (MAXIPIME) 2 g in sodium chloride 0.9 % 100 mL IVPB  Status:  Discontinued     2 g 200 mL/hr over 30 Minutes Intravenous Every 24 hours 02/10/2020 1927 02/02/20 1328   01/30/20 1000  remdesivir 100 mg in sodium chloride 0.9 % 100  mL IVPB     100 mg 200 mL/hr over 30 Minutes Intravenous Daily 02/21/2020 2258 02/02/20 1000   02/10/2020 2300  remdesivir 200 mg in sodium chloride 0.9% 250 mL IVPB     200 mg 580 mL/hr over 30 Minutes Intravenous Once 02/16/2020 2258 01/30/20 0102   01/31/2020 1929  vancomycin variable dose per unstable renal function (pharmacist dosing)  Status:  Discontinued      Does not apply See admin instructions 02/13/2020 1929 01/30/20 1429   02/13/2020 1730  ceFEPIme (MAXIPIME) 2 g in sodium chloride 0.9 % 100 mL IVPB     2 g 200 mL/hr over 30 Minutes Intravenous  Once 02/09/2020 1716 02/21/2020 1935   02/01/2020 1730  metroNIDAZOLE (FLAGYL) IVPB 500 mg     500 mg 100 mL/hr over 60 Minutes Intravenous  Once 02/07/2020 1716 02/09/2020 2000   01/30/2020 1730  vancomycin (VANCOCIN) IVPB 1000 mg/200 mL premix     1,000 mg 200 mL/hr over 60 Minutes Intravenous  Once 02/07/2020 1716 01/27/2020 2005     DVT prophylaxis.  SCDs added.   Inpatient Medications  Scheduled Meds: . feeding supplement  1 Container Oral TID BM  . feeding supplement (PRO-STAT SUGAR FREE 64)  30 mL Oral TID WC  . insulin aspart  0-9 Units Subcutaneous Q6H  . magic mouthwash w/lidocaine  10 mL Oral TID  . [START ON 02/10/2020] pantoprazole  40 mg Intravenous Q12H  . sucralfate  1 g Oral TID WC & HS   Continuous Infusions: . dextrose 5 % and 0.45% NaCl 50 mL/hr at 02/09/20 0855  . pantoprozole (PROTONIX) infusion 8 mg/hr (02/08/20 1428)   PRN Meds:.   Time Spent in minutes  25  See all Orders from today for further details  Lala Lund M.D on 02/05/2020 at 11:03 AM  To page go to [email or web address] - use universal password  [facility] -  Office  [phone]    Objective:   Vitals:   02/14/2020 0325 02/18/2020 0726 02/10/2020 1007 02/17/2020 1027  BP:   (!) 111/47 (!) 105/51  Pulse:   98 99  Resp:   18 16  Temp:   (!) 97.5 F (36.4 C) (!) 97.5 F (36.4 C)  TempSrc:   Oral Oral  SpO2:  97% 100%   Weight: 71.4 kg     Height:         Wt Readings from Last 3 Encounters:  02/13/2020 71.4 kg  04/12/14 81.6 kg  11/12/13 81.6 kg     Intake/Output Summary (Last 24 hours) at 02/20/2020 1103 Last data filed at 01/25/2020 0325 Gross per 24 hour  Intake --  Output 550 ml  Net -550 ml     Physical Exam  Elderly week African-American male lying in hospital bed in no discomfort, awake Alert,   [place].AT,PERRAL Supple Neck,No JVD, No cervical lymphadenopathy appriciated.  Symmetrical Chest wall movement, Good air movement bilaterally, CTAB RRR,No Gallops, Rubs or new Murmurs, No Parasternal Heave +ve B.Sounds, Abd Soft, No tenderness, No organomegaly appriciated, No rebound - guarding or rigidity. No Cyanosis, Clubbing or edema, No new Rash or bruise    Data Review:    CBC Recent Labs  Lab 02/11/2020 0305 02/18/2020 0305 02/06/20 1020 02/07/20 0016 02/07/20 0626 02/08/20 1200 02/07/2020 0350  WBC 17.7*  --  16.2* 21.7*  --  13.8* 12.0*  HGB 8.2*   < > 6.1* 11.0* 10.6* 8.2* 7.9*  HCT 25.6*   < > 19.0* 31.5* 33.9* 24.7* 24.1*  PLT 357  --  285 255  --  218 204  MCV 86.2  --  87.6 84.7  --  88.2 88.9  MCH 27.6  --  28.1 29.6  --  29.3 29.2  MCHC 32.0  --  32.1 34.9  --  33.2 32.8  RDW 14.9  --  15.7* 15.2  --  16.1* 16.4*  LYMPHSABS  --   --   --   --   --  1.0 0.9  MONOABS  --   --   --   --   --  1.1* 0.7  EOSABS  --   --   --   --   --  0.0 0.0  BASOSABS  --   --   --   --   --  0.1 0.0   < > = values in this interval not displayed.    Chemistries  Recent Labs  Lab 01/31/2020 0305 02/11/2020 0305 02/06/20 1020 02/07/20 0016 02/07/20 0626 02/08/20 1200 01/27/2020 0350  NA 142   < > 138 141 144 138 134*  K 5.2*   < > 5.5* 4.8 5.0 4.5 3.9  CL 111   < > 116* 115* 117* 111 107  CO2 21*   < > 15* 17* 15* 17* 19*  GLUCOSE 66*   < > 121* 73 70 196* 280*  BUN 84*   < > 86* 75* 67* 45* 42*  CREATININE 2.35*   < > 2.60* 2.49* 2.26* 2.14* 2.35*  CALCIUM 7.9*   < > 7.1* 7.8* 7.9* 7.2* 7.3*  MG  --   --   --   --    --  2.1 1.9  AST 32  --  26 34  --  30 26  ALT 21  --  20 24  --  21 23  ALKPHOS 50  --  36* 47  --  35* 43  BILITOT 0.5  --  0.3 1.1  --  1.0 1.0   < > = values in this interval not displayed.   ------------------------------------------------------------------------------------------------------------------ No results for input(s): CHOL, HDL, LDLCALC, TRIG, CHOLHDL, LDLDIRECT in the last 72 hours.  Lab Results  Component Value Date   HGBA1C 8.0 (H) 02/07/2020   ------------------------------------------------------------------------------------------------------------------ No results for input(s): TSH, T4TOTAL, T3FREE, THYROIDAB in the last 72 hours.  Invalid input(s): FREET3 ------------------------------------------------------------------------------------------------------------------ Recent Labs    02/07/20 0016  FERRITIN 335    Coagulation profile Recent Labs  Lab 02/08/20 1200  INR 1.2    Recent Labs    02/08/20 1200 01/31/2020 0350  DDIMER 2.77* 3.79*    Cardiac Enzymes No results for input(s): CKMB, TROPONINI, MYOGLOBIN in the last 168 hours.  Invalid input(s): CK ------------------------------------------------------------------------------------------------------------------    Component Value Date/Time   BNP 45.2 02/06/2020 0351    Micro Results No results found for this or any previous visit (from the past 240 hour(s)).  Radiology Reports CT Head Wo Contrast  Result Date: 02/01/2020 CLINICAL DATA:  Altered mental status EXAM: CT HEAD WITHOUT CONTRAST TECHNIQUE: Contiguous axial images were obtained from the base of the skull through the vertex without intravenous contrast. COMPARISON:  12/23/2008 FINDINGS: Brain: No evidence of acute infarction, hemorrhage, hydrocephalus, extra-axial collection or mass lesion/mass effect. There is a right frontal approach VP shunt with tip terminating in the right lateral ventricle. There is encephalomalacia along  the course of the VP shunt which has slightly worsened since the prior study. There is atrophy and chronic microvascular ischemic changes. There is a persistent 1 cm density at the left frontal horn periventricular white matter, stable from prior study. Vascular: No hyperdense vessel or unexpected calcification. Skull: Normal. Negative for fracture or focal lesion. Sinuses/Orbits: There is mucosal thickening of the maxillary sinuses and ethmoid air cells bilaterally. The remaining paranasal sinuses and mastoid air cells are essentially clear. Other: None. IMPRESSION: 1. No acute intracranial abnormality. 2. Chronic microvascular ischemic changes and atrophy which has progressed since prior study. 3. Well-positioned VP shunt. Electronically Signed   By: Constance Holster M.D.   On: 02/09/2020 19:32   DG Chest Port 1 View  Result Date: 02/07/2020 CLINICAL DATA:  Shortness of breath. EXAM: PORTABLE CHEST 1 VIEW COMPARISON:  January 29, 2020 FINDINGS: The mediastinal contour and cardiac silhouette are stable. The heart size is mildly enlarged. Mild patchy opacities are identified in both lung bases, slightly worsened compared prior exam. A VP shunt is identified unchanged. There is no pleural effusion. IMPRESSION: Mild patchy opacities are identified in both lung bases, developing pneumonias are not excluded. Electronically Signed   By: Abelardo Diesel M.D.   On: 02/07/2020 08:36   DG Chest Portable 1 View  Result Date: 01/28/2020 CLINICAL DATA:  Altered mental status EXAM: PORTABLE CHEST 1 VIEW COMPARISON:  09/08/2014 FINDINGS: VP shunt traversing the right chest, continuous where seen. Prominent markings at the bases. No effusion, edema, or pneumothorax. Normal heart size and mediastinal contours. IMPRESSION: Indistinct opacity at the lung bases could be atelectasis (lung volumes are low) or infectious infiltrates. Electronically Signed   By: Monte Fantasia M.D.   On: 02/04/2020 17:47   CT RENAL STONE  STUDY  Result Date: 01/30/2020 CLINICAL DATA:  Pyelonephritis. EXAM: CT ABDOMEN AND PELVIS WITHOUT CONTRAST TECHNIQUE: Multidetector CT imaging of the abdomen  and pelvis was performed following the standard protocol without IV contrast. COMPARISON:  April 06, 2014. FINDINGS: Lower chest: Multiple airspace opacities are noted in the visualized lung bases concerning for multifocal pneumonia. Hepatobiliary: No focal liver abnormality is seen. No gallstones, gallbladder wall thickening, or biliary dilatation. Pancreas: Unremarkable. No pancreatic ductal dilatation or surrounding inflammatory changes. Spleen: Normal in size without focal abnormality. Adrenals/Urinary Tract: Adrenal glands are unremarkable. Kidneys are normal, without renal calculi, focal lesion, or hydronephrosis. Bladder is unremarkable. Stomach/Bowel: Stomach is within normal limits. Appendix appears normal. No evidence of bowel wall thickening, distention, or inflammatory changes. Vascular/Lymphatic: Aortic atherosclerosis. No enlarged abdominal or pelvic lymph nodes. Reproductive: Prostate is unremarkable. Other: No abdominal wall hernia or abnormality. No abdominopelvic ascites. There is again noted right-sided ventriculoperitoneal shunt with distal tip in the right lower quadrant. Musculoskeletal: No acute or significant osseous findings. IMPRESSION: 1. Multiple airspace opacities are noted in the visualized lung bases concerning for multifocal pneumonia. 2. Aortic atherosclerosis. 3. No acute abnormality seen in the abdomen or pelvis. Aortic Atherosclerosis (ICD10-I70.0). Electronically Signed   By: Marijo Conception M.D.   On: 01/30/2020 10:48   ECHOCARDIOGRAM LIMITED  Result Date: 01/31/2020   ECHOCARDIOGRAM REPORT   Patient Name:   JONOTHAN MELIAN Date of Exam: 01/31/2020 Medical Rec #:  [id number]      Height:       70.0 in Accession #:    [id number]     Weight:       159.8 lb Date of Birth:  July 30, 1941      BSA:          1.90 m Patient Age:     32 years       BP:           147/68 mmHg Patient Gender: M              HR:           95 bpm. Exam Location:  Inpatient Procedure: Limited Echo Indications:    Fever 780.6/R50.9  History:        Patient has no prior history of Echocardiogram examinations.                 Risk Factors:Hypertension, Dyslipidemia and Diabetes.  Sonographer:    Clayton Lefort RDCS (AE) Referring Phys: [id number] [place]  1. Left ventricular ejection fraction, by visual estimation, is 60 to 65%. The left ventricle has normal function. There is severely increased left ventricular hypertrophy of the basal septum.  2. Left ventricular diastolic parameters are consistent with Grade I diastolic dysfunction (impaired relaxation).  3. Elevated left ventricular end-diastolic pressure.  4. Global right ventricle has normal systolic function.The right ventricular size is normal. No increase in right ventricular wall thickness.  5. Left atrial size was normal.  6. Right atrial size was normal.  7. Mild to moderate mitral annular calcification.  8. The mitral valve is normal in structure. No evidence of mitral valve regurgitation. No evidence of mitral stenosis.  9. The tricuspid valve is normal in structure. Tricuspid valve regurgitation is not demonstrated. 10. The aortic valve is tricuspid. Aortic valve regurgitation is not visualized. Mild aortic valve sclerosis without stenosis. 11. The pulmonic valve was normal in structure. Pulmonic valve regurgitation is not visualized. 12. The inferior vena cava is normal in size with greater than 50% respiratory variability, suggesting right atrial pressure of 3 mmHg. 13. TR signal is inadequate for assessing pulmonary artery systolic pressure. 14.  Poor acoustical windows limit ability to adequately assess for vegetation. No obvious massess on valve. Recommend TEE if clinically indicated. FINDINGS  Left Ventricle: Left ventricular ejection fraction, by visual estimation, is 60 to 65%. The left  ventricle has normal function. The left ventricle is not well visualized. There is severely increased left ventricular hypertrophy of the basal septum. Left  ventricular diastolic parameters are consistent with Grade I diastolic dysfunction (impaired relaxation). Elevated left ventricular end-diastolic pressure. Right Ventricle: The right ventricular size is normal. No increase in right ventricular wall thickness. Global RV systolic function is has normal systolic function. Left Atrium: Left atrial size was normal in size. Right Atrium: Right atrial size was normal in size Pericardium: There is no evidence of pericardial effusion. Mitral Valve: The mitral valve is normal in structure. Mild to moderate mitral annular calcification. No evidence of mitral valve regurgitation. No evidence of mitral valve stenosis by observation. Tricuspid Valve: The tricuspid valve is normal in structure. Tricuspid valve regurgitation is not demonstrated. Aortic Valve: The aortic valve is tricuspid. Aortic valve regurgitation is not visualized. Mild aortic valve sclerosis is present, with no evidence of aortic valve stenosis. Pulmonic Valve: The pulmonic valve was normal in structure. Pulmonic valve regurgitation is not visualized. Pulmonic regurgitation is not visualized. Aorta: The aortic root, ascending aorta and aortic arch are all structurally normal, with no evidence of dilitation or obstruction. Venous: The inferior vena cava is normal in size with greater than 50% respiratory variability, suggesting right atrial pressure of 3 mmHg. IAS/Shunts: No atrial level shunt detected by color flow Doppler. There is no evidence of a patent foramen ovale. No ventricular septal defect is seen or detected. There is no evidence of an atrial septal defect.  LEFT VENTRICLE PLAX 2D LVIDd:         3.70 cm  Diastology LVIDs:         2.30 cm  LV e' lateral:   5.33 cm/s LV PW:         1.30 cm  LV E/e' lateral: 14.5 LV IVS:        1.60 cm  LV e'  medial:    6.09 cm/s LVOT diam:     2.00 cm  LV E/e' medial:  12.7 LV SV:         40 ml LV SV Index:   21.12 LVOT Area:     3.14 cm  LEFT ATRIUM         Index LA diam:    2.70 cm 1.42 cm/m   AORTA Ao Root diam: 2.80 cm MITRAL VALVE MV Area (PHT): 7.99 cm              SHUNTS MV PHT:        27.55 msec            Systemic Diam: 2.00 cm MV Decel Time: 95 msec MV E velocity: 77.10 cm/s  103 cm/s MV A velocity: 118.00 cm/s 70.3 cm/s MV E/A ratio:  0.65        1.5  Fransico Him MD Electronically signed by Fransico Him MD Signature Date/Time: 01/31/2020/11:57:58 AM    Final    DG ESOPHAGUS W SINGLE CM (SOL OR THIN BA)  Result Date: 02/01/2020 CLINICAL DATA:  Mid chest pain and heartburn. Recent bedside speech pathology evaluation. COVID-19 infection. EXAM: SINGLE COLUMN BARIUM ENEMA TECHNIQUE: Initial scout AP supine abdominal image obtained to insure adequate colon cleansing. Barium was introduced into the colon in a retrograde fashion and refluxed  from the rectum to the cecum. Spot images of the colon followed by overhead radiographs were obtained. FLUOROSCOPY TIME:  Fluoroscopy Time: 1 minutes and 0 seconds of low-dose pulsed fluoroscopy Radiation Exposure Index (if provided by the fluoroscopic device): 8.7 mGy Number of Acquired Spot Images: 0 COMPARISON:  Chest radiographs 02/17/2020. Abdominal CT 01/30/2020. FINDINGS: The study was performed in the supine and right lateral decubitus positions. The esophageal motility appears normal. No laryngeal penetration or abnormality of the cervical esophagus was demonstrated. There is mild smooth narrowing of the distal esophageal lumen without mucosal ulceration. A 13 mm barium tablet was administered and was temporarily delayed in the cervical esophagus. This subsequently passed into the distal esophagus. Despite drinking water and thin barium, this tablet did not pass into the stomach during approximately 3 minutes of intermittent fluoroscopic observation with the patient  supine and semi erect. IMPRESSION: 1. Mild smooth narrowing of the distal esophageal lumen, likely due to chronic reflux. No evidence of mucosal ulceration. 2. Barium tablet did not pass into the stomach during approximately 3 minutes of intermittent observation. Electronically Signed   By: Richardean Sale M.D.   On: 02/01/2020 14:25   Signature  Lala Lund M.D on 02/06/2020 at 11:03 AM   -  To page go to [email or web address]                                                                        PROGRESS NOTE                                                                                                                                                                                                             Patient Demographics:    Bob Vazquez, is a 79 y.o. male, DOB - Oct 31, 1941, [place]:[id number]  Outpatient Primary MD for the patient is Nolene Ebbs, MD   Admit date - 02/01/2020   LOS - 81  Chief Complaint  Patient presents with  . Altered Mental Status  . Hyperglycemia       Brief Narrative: Patient is a 79 y.o. male with PMHx of DM-2, HTN, HLD, NPH-who was found very confused in an empty bathtub on a wellness check by law enforcement (family had not heard from him for several days)-he was subsequently brought to  the ED-and was found to have acute metabolic encephalopathy in the setting of severe sepsis due to UTI, DKA and COVID-19 pneumonia.  He was admitted to the [facility] clinical stability transferred to the [facility] on 2/9.  Further hospital course has been complicated by odynophagia due to severe erosive esophagitis-acute blood loss anemia likely due to upper GI bleeding.  See below for further details.   Subjective:   Patient in bed appears tired and lethargic, denies any headache, no chest or abdominal pain but says he feels weak.   Assessment  & Plan :   Odynophagia: Continues to have severe odynophagia to both solids and liquids-in spite of being on Diflucan,  PPI and Carafate.  Barium esophagogram on 2/8 showed some mild smooth narrowing in the distal esophagus.  EGD completed on 2/12-showed severe esophagitis and gastric ulcer with visible vessel/and a nonbleeding duodenal ulcer with an adherent clot.  No evidence of Candida, Magic mouthwash with lidocaine added premeals with some improvement in his odynophagia, GI and speech following.  Upper GI bleeding with acute blood loss anemia: Hemoglobin has been trending down-initially thought to be due to acute illness-however due to EGD findings-high suspicion that this patient is likely having a upper GI bleeding.  Did drop H&H with evidence of acute anemia due to upper GI blood loss requiring 2 units of packed RBC on 02/06/2020, IV PPI being continued, his H&H had stabilized unfortunately on 01/25/2020 it has dropped again and I think he is bleeding again, will repeat 1 unit of packed RBC on 02/01/2020, n.p.o., have discussed with GI repeat EGD soon.  Covid 19 Viral pneumonia: Stable on room air-has completed a course of remdesivir-CRP slowly downtrending now-given GI bleeding-we will stop steroids-patient is on room air as well.  Clinically stable from the standpoint.  Asymptomatic on room air.  O2 requirements:  SpO2: 99 % O2 Flow Rate (L/min): Room air  COVID-19 Labs: Recent Labs    02/07/20 0016 02/07/20 0626 02/08/20 1200 02/21/2020 0350  DDIMER  --  2.08* 2.77* 3.79*  FERRITIN 335  --   --   --   CRP 4.4*  --  14.1* 17.0*       Component Value Date/Time   BNP 45.2 02/21/2020 0351    No results for input(s): PROCALCITON in the last 168 hours.  Lab Results  Component Value Date   SARSCOV2NAA POSITIVE (A) 02/06/2020   [place] Not Detected 07/13/2019     COVID-19 Medications: Steroids: 2/5>> Remdesivir: 2/5>> 2/9  Severe sepsis secondary to UTI: Sepsis physiology has resolved-cultures positive for Klebsiella pneumonia and Streptococcus agalactiae-blood cultures negative.  Has  completed a course of antimicrobial therapy.  Antibiotics: Ceftriaxone: 2/9>>2/11 Cefepime: 2/5>> 2/8  Acute metabolic encephalopathy: Secondary to DKA/AKI-improved-suspect patient not far from usual baseline.  AKI on CKD stage IIIb: AKI likely hemodynamically mediated-creatinine continues to slightly increase-likely secondary to poor oral intake due to odynophagia-an upper GI bleeding.  Bladder scans without significant urinary retention-follow for now.  Hyperkalemia: Slightly worse today-continue Lokelma-but will go over them give 1 dose of insulin/D50.  Repeat electrolytes tomorrow.    DKA: Resolved.  Unfortunately patient ran out of his insulin-and could not get it refilled-likely provoking behavior along with Covid/UTI.  Insulin-dependent DM-2: CBG stable-continue SSI-as patient will be n.p.o-we will change Lantus to once daily dosing.  Lab Results  Component Value Date   HGBA1C 8.0 (H) 02/07/2020    Recent Labs    02/08/20 1557 02/08/20 2040 02/11/2020 [id number]  GLUCAP  228* 260* 267*   HTN: Controlled-not on any antihypertensives.  HLD: Statins on hold-resume in the next few days.  Orthostatic Hypotension: probably secondary to dehydration/Diabetic neuropathy-retrospectively-may have been developing a GI bleeding that could have contributed to orthostatic hypotension.  All antihypertensives on hold-gentle IV fluid hydration in progress.   Deconditioning/debility: Secondary to acute illness-PT/OT following-recommendations of SNF-social work consulted.  Consults  :  PCCM,GI  Procedures  :    EGD -showing severe esophagitis and ulcer  Barium swallow -assistant with esophageal stricture.  Echocardiogram    1. Left ventricular ejection fraction, by visual estimation, is 60 to 65%. The left ventricle has normal function. There is severely increased  left ventricular hypertrophy of the basal septum.  2. Left ventricular diastolic parameters are consistent with Grade I  diastolic dysfunction (impaired relaxation).  3. Elevated left ventricular end-diastolic pressure.  4. Global right ventricle has normal systolic function.The right ventricular size is normal. No increase in right ventricular wall thickness.  5. Left atrial size was normal.  6. Right atrial size was normal.  7. Mild to moderate mitral annular calcification.  8. The mitral valve is normal in structure. No evidence of mitral valve regurgitation. No evidence of mitral stenosis.  9. The tricuspid valve is normal in structure. Tricuspid valve regurgitation is not demonstrated.  10. The aortic valve is tricuspid. Aortic valve regurgitation is not visualized. Mild aortic valve sclerosis without stenosis.  11. The pulmonic valve was normal in structure. Pulmonic valve regurgitation is not visualized.  12. The inferior vena cava is normal in size with greater than 50% respiratory variability, suggesting right atrial pressure of 3 mmHg.  13. TR signal is inadequate for assessing pulmonary artery systolic pressure.  14. Poor acoustical windows limit ability to adequately assess for vegetation. No obvious massess on valve. Recommend TEE if clinically indicated.   Condition - extremely guarded  Family Communication  : Updated patient's niece on 02/08/2020, they are aware of patient's wishes of being DNR and hospice if he declines.  Code Status : DNR  Diet :  Diet Order            Diet NPO time specified  Diet effective now               Disposition Plan  : SNF once upper GI bleed improves and odynophagia improved to the extent that he is taking oral diet reliably.  Antimicorbials  :    Anti-infectives (From admission, onward)   Start     Dose/Rate Route Frequency Ordered Stop   02/02/20 1400  fluconazole (DIFLUCAN) IVPB 100 mg  Status:  Discontinued     100 mg 50 mL/hr over 60 Minutes Intravenous Every 24 hours 02/02/20 1341 02/06/20 1148   02/02/20 1330  cefTRIAXone (ROCEPHIN) 1 g in  sodium chloride 0.9 % 100 mL IVPB     1 g 200 mL/hr over 30 Minutes Intravenous Every 24 hours 02/02/20 1328 02/04/20 1234   01/30/20 1830  ceFEPIme (MAXIPIME) 2 g in sodium chloride 0.9 % 100 mL IVPB  Status:  Discontinued     2 g 200 mL/hr over 30 Minutes Intravenous Every 24 hours 02/16/2020 1927 02/02/20 1328   01/30/20 1000  remdesivir 100 mg in sodium chloride 0.9 % 100 mL IVPB     100 mg 200 mL/hr over 30 Minutes Intravenous Daily 02/14/2020 2258 02/02/20 1000   02/13/2020 2300  remdesivir 200 mg in sodium chloride 0.9% 250 mL IVPB     200 mg  580 mL/hr over 30 Minutes Intravenous Once 01/31/2020 2258 01/30/20 0102   02/20/2020 1929  vancomycin variable dose per unstable renal function (pharmacist dosing)  Status:  Discontinued      Does not apply See admin instructions 02/02/2020 1929 01/30/20 1429   02/13/2020 1730  ceFEPIme (MAXIPIME) 2 g in sodium chloride 0.9 % 100 mL IVPB     2 g 200 mL/hr over 30 Minutes Intravenous  Once 01/25/2020 1716 02/01/2020 1935   02/14/2020 1730  metroNIDAZOLE (FLAGYL) IVPB 500 mg     500 mg 100 mL/hr over 60 Minutes Intravenous  Once 02/20/2020 1716 02/06/2020 2000   01/30/2020 1730  vancomycin (VANCOCIN) IVPB 1000 mg/200 mL premix     1,000 mg 200 mL/hr over 60 Minutes Intravenous  Once 02/16/2020 1716 01/30/2020 2005     DVT prophylaxis.  SCDs added.   Inpatient Medications  Scheduled Meds: . feeding supplement  1 Container Oral TID BM  . feeding supplement (PRO-STAT SUGAR FREE 64)  30 mL Oral TID WC  . insulin aspart  0-9 Units Subcutaneous Q6H  . magic mouthwash w/lidocaine  10 mL Oral TID  . [START ON 02/10/2020] pantoprazole  40 mg Intravenous Q12H  . sucralfate  1 g Oral TID WC & HS   Continuous Infusions: . dextrose 5 % and 0.45% NaCl 50 mL/hr at 01/30/2020 0855  . pantoprozole (PROTONIX) infusion 8 mg/hr (02/08/20 1428)   PRN Meds:.   Time Spent in minutes  25  See all Orders from today for further details   Lala Lund M.D on 02/10/2020 at 11:03  AM  To page go to [email or web address] - use universal password  [facility] -  Office  [phone]    Objective:   Vitals:   02/08/2020 0325 02/02/2020 0726 02/11/2020 1007 02/14/2020 1027  BP:   (!) 111/47 (!) 105/51  Pulse:   98 99  Resp:   18 16  Temp:   (!) 97.5 F (36.4 C) (!) 97.5 F (36.4 C)  TempSrc:   Oral Oral  SpO2:  97% 100%   Weight: 71.4 kg     Height:        Wt Readings from Last 3 Encounters:  01/30/2020 71.4 kg  04/12/14 81.6 kg  11/12/13 81.6 kg     Intake/Output Summary (Last 24 hours) at 02/07/2020 1103 Last data filed at 02/11/2020 0325 Gross per 24 hour  Intake --  Output 550 ml  Net -550 ml     Physical Exam  Elderly week African-American male lying in hospital bed in no discomfort, appears slightly more lethargic today [place].AT,PERRAL Supple Neck,No JVD, No cervical lymphadenopathy appriciated.  Symmetrical Chest wall movement, Good air movement bilaterally, CTAB RRR,No Gallops, Rubs or new Murmurs, No Parasternal Heave +ve B.Sounds, Abd Soft, No tenderness, No organomegaly appriciated, No rebound - guarding or rigidity. No Cyanosis, Clubbing or edema, No new Rash or bruise     Data Review:    CBC Recent Labs  Lab 02/04/2020 0305 02/13/2020 0305 02/06/20 1020 02/07/20 0016 02/07/20 0626 02/08/20 1200 02/13/2020 0350  WBC 17.7*  --  16.2* 21.7*  --  13.8* 12.0*  HGB 8.2*   < > 6.1* 11.0* 10.6* 8.2* 7.9*  HCT 25.6*   < > 19.0* 31.5* 33.9* 24.7* 24.1*  PLT 357  --  285 255  --  218 204  MCV 86.2  --  87.6 84.7  --  88.2 88.9  MCH 27.6  --  28.1 29.6  --  29.3  29.2  MCHC 32.0  --  32.1 34.9  --  33.2 32.8  RDW 14.9  --  15.7* 15.2  --  16.1* 16.4*  LYMPHSABS  --   --   --   --   --  1.0 0.9  MONOABS  --   --   --   --   --  1.1* 0.7  EOSABS  --   --   --   --   --  0.0 0.0  BASOSABS  --   --   --   --   --  0.1 0.0   < > = values in this interval not displayed.    Chemistries  Recent Labs  Lab 02/17/2020 0305 02/20/2020 0305  02/06/20 1020 02/07/20 0016 02/07/20 0626 02/08/20 1200 02/10/2020 0350  NA 142   < > 138 141 144 138 134*  K 5.2*   < > 5.5* 4.8 5.0 4.5 3.9  CL 111   < > 116* 115* 117* 111 107  CO2 21*   < > 15* 17* 15* 17* 19*  GLUCOSE 66*   < > 121* 73 70 196* 280*  BUN 84*   < > 86* 75* 67* 45* 42*  CREATININE 2.35*   < > 2.60* 2.49* 2.26* 2.14* 2.35*  CALCIUM 7.9*   < > 7.1* 7.8* 7.9* 7.2* 7.3*  MG  --   --   --   --   --  2.1 1.9  AST 32  --  26 34  --  30 26  ALT 21  --  20 24  --  21 23  ALKPHOS 50  --  36* 47  --  35* 43  BILITOT 0.5  --  0.3 1.1  --  1.0 1.0   < > = values in this interval not displayed.   ------------------------------------------------------------------------------------------------------------------ No results for input(s): CHOL, HDL, LDLCALC, TRIG, CHOLHDL, LDLDIRECT in the last 72 hours.  Lab Results  Component Value Date   HGBA1C 8.0 (H) 02/07/2020   ------------------------------------------------------------------------------------------------------------------ No results for input(s): TSH, T4TOTAL, T3FREE, THYROIDAB in the last 72 hours.  Invalid input(s): FREET3 ------------------------------------------------------------------------------------------------------------------ Recent Labs    02/07/20 0016  FERRITIN 335    Coagulation profile Recent Labs  Lab 02/08/20 1200  INR 1.2    Recent Labs    02/08/20 1200 02/08/2020 0350  DDIMER 2.77* 3.79*    Cardiac Enzymes No results for input(s): CKMB, TROPONINI, MYOGLOBIN in the last 168 hours.  Invalid input(s): CK ------------------------------------------------------------------------------------------------------------------    Component Value Date/Time   BNP 45.2 02/09/2020 0351    Micro Results No results found for this or any previous visit (from the past 240 hour(s)).  Radiology Reports CT Head Wo Contrast  Result Date: 01/29/2020 CLINICAL DATA:  Altered mental status EXAM: CT  HEAD WITHOUT CONTRAST TECHNIQUE: Contiguous axial images were obtained from the base of the skull through the vertex without intravenous contrast. COMPARISON:  12/23/2008 FINDINGS: Brain: No evidence of acute infarction, hemorrhage, hydrocephalus, extra-axial collection or mass lesion/mass effect. There is a right frontal approach VP shunt with tip terminating in the right lateral ventricle. There is encephalomalacia along the course of the VP shunt which has slightly worsened since the prior study. There is atrophy and chronic microvascular ischemic changes. There is a persistent 1 cm density at the left frontal horn periventricular white matter, stable from prior study. Vascular: No hyperdense vessel or unexpected calcification. Skull: Normal. Negative for fracture or focal lesion. Sinuses/Orbits: There is mucosal  thickening of the maxillary sinuses and ethmoid air cells bilaterally. The remaining paranasal sinuses and mastoid air cells are essentially clear. Other: None. IMPRESSION: 1. No acute intracranial abnormality. 2. Chronic microvascular ischemic changes and atrophy which has progressed since prior study. 3. Well-positioned VP shunt. Electronically Signed   By: Constance Holster M.D.   On: 02/12/2020 19:32   DG Chest Port 1 View  Result Date: 02/07/2020 CLINICAL DATA:  Shortness of breath. EXAM: PORTABLE CHEST 1 VIEW COMPARISON:  January 29, 2020 FINDINGS: The mediastinal contour and cardiac silhouette are stable. The heart size is mildly enlarged. Mild patchy opacities are identified in both lung bases, slightly worsened compared prior exam. A VP shunt is identified unchanged. There is no pleural effusion. IMPRESSION: Mild patchy opacities are identified in both lung bases, developing pneumonias are not excluded. Electronically Signed   By: Abelardo Diesel M.D.   On: 02/07/2020 08:36   DG Chest Portable 1 View  Result Date: 01/25/2020 CLINICAL DATA:  Altered mental status EXAM: PORTABLE CHEST 1 VIEW  COMPARISON:  09/08/2014 FINDINGS: VP shunt traversing the right chest, continuous where seen. Prominent markings at the bases. No effusion, edema, or pneumothorax. Normal heart size and mediastinal contours. IMPRESSION: Indistinct opacity at the lung bases could be atelectasis (lung volumes are low) or infectious infiltrates. Electronically Signed   By: Monte Fantasia M.D.   On: 01/30/2020 17:47   CT RENAL STONE STUDY  Result Date: 01/30/2020 CLINICAL DATA:  Pyelonephritis. EXAM: CT ABDOMEN AND PELVIS WITHOUT CONTRAST TECHNIQUE: Multidetector CT imaging of the abdomen and pelvis was performed following the standard protocol without IV contrast. COMPARISON:  April 06, 2014. FINDINGS: Lower chest: Multiple airspace opacities are noted in the visualized lung bases concerning for multifocal pneumonia. Hepatobiliary: No focal liver abnormality is seen. No gallstones, gallbladder wall thickening, or biliary dilatation. Pancreas: Unremarkable. No pancreatic ductal dilatation or surrounding inflammatory changes. Spleen: Normal in size without focal abnormality. Adrenals/Urinary Tract: Adrenal glands are unremarkable. Kidneys are normal, without renal calculi, focal lesion, or hydronephrosis. Bladder is unremarkable. Stomach/Bowel: Stomach is within normal limits. Appendix appears normal. No evidence of bowel wall thickening, distention, or inflammatory changes. Vascular/Lymphatic: Aortic atherosclerosis. No enlarged abdominal or pelvic lymph nodes. Reproductive: Prostate is unremarkable. Other: No abdominal wall hernia or abnormality. No abdominopelvic ascites. There is again noted right-sided ventriculoperitoneal shunt with distal tip in the right lower quadrant. Musculoskeletal: No acute or significant osseous findings. IMPRESSION: 1. Multiple airspace opacities are noted in the visualized lung bases concerning for multifocal pneumonia. 2. Aortic atherosclerosis. 3. No acute abnormality seen in the abdomen or pelvis.  Aortic Atherosclerosis (ICD10-I70.0). Electronically Signed   By: Marijo Conception M.D.   On: 01/30/2020 10:48   ECHOCARDIOGRAM LIMITED  Result Date: 01/31/2020   ECHOCARDIOGRAM REPORT   Patient Name:   DELIO SHNAYDER Date of Exam: 01/31/2020 Medical Rec #:  [id number]      Height:       70.0 in Accession #:    [id number]     Weight:       159.8 lb Date of Birth:  10-16-1941      BSA:          1.90 m Patient Age:    26 years       BP:           147/68 mmHg Patient Gender: M              HR:  95 bpm. Exam Location:  Inpatient Procedure: Limited Echo Indications:    Fever 780.6/R50.9  History:        Patient has no prior history of Echocardiogram examinations.                 Risk Factors:Hypertension, Dyslipidemia and Diabetes.  Sonographer:    Clayton Lefort RDCS (AE) Referring Phys: [id number] [place]  1. Left ventricular ejection fraction, by visual estimation, is 60 to 65%. The left ventricle has normal function. There is severely increased left ventricular hypertrophy of the basal septum.  2. Left ventricular diastolic parameters are consistent with Grade I diastolic dysfunction (impaired relaxation).  3. Elevated left ventricular end-diastolic pressure.  4. Global right ventricle has normal systolic function.The right ventricular size is normal. No increase in right ventricular wall thickness.  5. Left atrial size was normal.  6. Right atrial size was normal.  7. Mild to moderate mitral annular calcification.  8. The mitral valve is normal in structure. No evidence of mitral valve regurgitation. No evidence of mitral stenosis.  9. The tricuspid valve is normal in structure. Tricuspid valve regurgitation is not demonstrated. 10. The aortic valve is tricuspid. Aortic valve regurgitation is not visualized. Mild aortic valve sclerosis without stenosis. 11. The pulmonic valve was normal in structure. Pulmonic valve regurgitation is not visualized. 12. The inferior vena cava is normal in size  with greater than 50% respiratory variability, suggesting right atrial pressure of 3 mmHg. 13. TR signal is inadequate for assessing pulmonary artery systolic pressure. 14. Poor acoustical windows limit ability to adequately assess for vegetation. No obvious massess on valve. Recommend TEE if clinically indicated. FINDINGS  Left Ventricle: Left ventricular ejection fraction, by visual estimation, is 60 to 65%. The left ventricle has normal function. The left ventricle is not well visualized. There is severely increased left ventricular hypertrophy of the basal septum. Left  ventricular diastolic parameters are consistent with Grade I diastolic dysfunction (impaired relaxation). Elevated left ventricular end-diastolic pressure. Right Ventricle: The right ventricular size is normal. No increase in right ventricular wall thickness. Global RV systolic function is has normal systolic function. Left Atrium: Left atrial size was normal in size. Right Atrium: Right atrial size was normal in size Pericardium: There is no evidence of pericardial effusion. Mitral Valve: The mitral valve is normal in structure. Mild to moderate mitral annular calcification. No evidence of mitral valve regurgitation. No evidence of mitral valve stenosis by observation. Tricuspid Valve: The tricuspid valve is normal in structure. Tricuspid valve regurgitation is not demonstrated. Aortic Valve: The aortic valve is tricuspid. Aortic valve regurgitation is not visualized. Mild aortic valve sclerosis is present, with no evidence of aortic valve stenosis. Pulmonic Valve: The pulmonic valve was normal in structure. Pulmonic valve regurgitation is not visualized. Pulmonic regurgitation is not visualized. Aorta: The aortic root, ascending aorta and aortic arch are all structurally normal, with no evidence of dilitation or obstruction. Venous: The inferior vena cava is normal in size with greater than 50% respiratory variability, suggesting right atrial  pressure of 3 mmHg. IAS/Shunts: No atrial level shunt detected by color flow Doppler. There is no evidence of a patent foramen ovale. No ventricular septal defect is seen or detected. There is no evidence of an atrial septal defect.  LEFT VENTRICLE PLAX 2D LVIDd:         3.70 cm  Diastology LVIDs:         2.30 cm  LV e' lateral:   5.33 cm/s  LV PW:         1.30 cm  LV E/e' lateral: 14.5 LV IVS:        1.60 cm  LV e' medial:    6.09 cm/s LVOT diam:     2.00 cm  LV E/e' medial:  12.7 LV SV:         40 ml LV SV Index:   21.12 LVOT Area:     3.14 cm  LEFT ATRIUM         Index LA diam:    2.70 cm 1.42 cm/m   AORTA Ao Root diam: 2.80 cm MITRAL VALVE MV Area (PHT): 7.99 cm              SHUNTS MV PHT:        27.55 msec            Systemic Diam: 2.00 cm MV Decel Time: 95 msec MV E velocity: 77.10 cm/s  103 cm/s MV A velocity: 118.00 cm/s 70.3 cm/s MV E/A ratio:  0.65        1.5  Fransico Him MD Electronically signed by Fransico Him MD Signature Date/Time: 01/31/2020/11:57:58 AM    Final    DG ESOPHAGUS W SINGLE CM (SOL OR THIN BA)  Result Date: 02/01/2020 CLINICAL DATA:  Mid chest pain and heartburn. Recent bedside speech pathology evaluation. COVID-19 infection. EXAM: SINGLE COLUMN BARIUM ENEMA TECHNIQUE: Initial scout AP supine abdominal image obtained to insure adequate colon cleansing. Barium was introduced into the colon in a retrograde fashion and refluxed from the rectum to the cecum. Spot images of the colon followed by overhead radiographs were obtained. FLUOROSCOPY TIME:  Fluoroscopy Time: 1 minutes and 0 seconds of low-dose pulsed fluoroscopy Radiation Exposure Index (if provided by the fluoroscopic device): 8.7 mGy Number of Acquired Spot Images: 0 COMPARISON:  Chest radiographs 02/16/2020. Abdominal CT 01/30/2020. FINDINGS: The study was performed in the supine and right lateral decubitus positions. The esophageal motility appears normal. No laryngeal penetration or abnormality of the cervical esophagus was  demonstrated. There is mild smooth narrowing of the distal esophageal lumen without mucosal ulceration. A 13 mm barium tablet was administered and was temporarily delayed in the cervical esophagus. This subsequently passed into the distal esophagus. Despite drinking water and thin barium, this tablet did not pass into the stomach during approximately 3 minutes of intermittent fluoroscopic observation with the patient supine and semi erect. IMPRESSION: 1. Mild smooth narrowing of the distal esophageal lumen, likely due to chronic reflux. No evidence of mucosal ulceration. 2. Barium tablet did not pass into the stomach during approximately 3 minutes of intermittent observation. Electronically Signed   By: Richardean Sale M.D.   On: 02/01/2020 14:25   Signature  Lala Lund M.D on 02/18/2020 at 11:03 AM   -  To page go to [email or web address]

## 2020-02-10 LAB — GLUCOSE, CAPILLARY
Glucose-Capillary: 200 mg/dL — ABNORMAL HIGH (ref 70–99)
Glucose-Capillary: 221 mg/dL — ABNORMAL HIGH (ref 70–99)
Glucose-Capillary: 225 mg/dL — ABNORMAL HIGH (ref 70–99)
Glucose-Capillary: 232 mg/dL — ABNORMAL HIGH (ref 70–99)
Glucose-Capillary: 310 mg/dL — ABNORMAL HIGH (ref 70–99)
Glucose-Capillary: 322 mg/dL — ABNORMAL HIGH (ref 70–99)

## 2020-02-10 LAB — CBC WITH DIFFERENTIAL/PLATELET
Abs Immature Granulocytes: 0.19 10*3/uL — ABNORMAL HIGH (ref 0.00–0.07)
Basophils Absolute: 0 10*3/uL (ref 0.0–0.1)
Basophils Relative: 0 %
Eosinophils Absolute: 0.1 10*3/uL (ref 0.0–0.5)
Eosinophils Relative: 1 %
HCT: 22.8 % — ABNORMAL LOW (ref 39.0–52.0)
Hemoglobin: 7.9 g/dL — ABNORMAL LOW (ref 13.0–17.0)
Immature Granulocytes: 2 %
Lymphocytes Relative: 7 %
Lymphs Abs: 0.8 10*3/uL (ref 0.7–4.0)
MCH: 29.7 pg (ref 26.0–34.0)
MCHC: 34.6 g/dL (ref 30.0–36.0)
MCV: 85.7 fL (ref 80.0–100.0)
Monocytes Absolute: 0.8 10*3/uL (ref 0.1–1.0)
Monocytes Relative: 7 %
Neutro Abs: 9.7 10*3/uL — ABNORMAL HIGH (ref 1.7–7.7)
Neutrophils Relative %: 83 %
Platelets: 167 10*3/uL (ref 150–400)
RBC: 2.66 MIL/uL — ABNORMAL LOW (ref 4.22–5.81)
RDW: 16 % — ABNORMAL HIGH (ref 11.5–15.5)
WBC: 11.7 10*3/uL — ABNORMAL HIGH (ref 4.0–10.5)
nRBC: 0.2 % (ref 0.0–0.2)

## 2020-02-10 LAB — BPAM RBC
Blood Product Expiration Date: 202102212359
Blood Product Expiration Date: 202103112359
Blood Product Expiration Date: 202103142359
ISSUE DATE / TIME: 202102131514
ISSUE DATE / TIME: 202102131834
ISSUE DATE / TIME: 202102161002
Unit Type and Rh: 5100
Unit Type and Rh: 5100
Unit Type and Rh: 5100

## 2020-02-10 LAB — TYPE AND SCREEN
ABO/RH(D): O POS
Antibody Screen: NEGATIVE
Unit division: 0
Unit division: 0
Unit division: 0

## 2020-02-10 LAB — COMPREHENSIVE METABOLIC PANEL
ALT: 23 U/L (ref 0–44)
AST: 26 U/L (ref 15–41)
Albumin: 1.5 g/dL — ABNORMAL LOW (ref 3.5–5.0)
Alkaline Phosphatase: 40 U/L (ref 38–126)
Anion gap: 6 (ref 5–15)
BUN: 33 mg/dL — ABNORMAL HIGH (ref 8–23)
CO2: 18 mmol/L — ABNORMAL LOW (ref 22–32)
Calcium: 7.2 mg/dL — ABNORMAL LOW (ref 8.9–10.3)
Chloride: 110 mmol/L (ref 98–111)
Creatinine, Ser: 2.07 mg/dL — ABNORMAL HIGH (ref 0.61–1.24)
GFR calc Af Amer: 34 mL/min — ABNORMAL LOW (ref >60)
GFR calc non Af Amer: 29 mL/min — ABNORMAL LOW (ref >60)
Glucose, Bld: 244 mg/dL — ABNORMAL HIGH (ref 70–99)
Potassium: 4.5 mmol/L (ref 3.5–5.1)
Sodium: 134 mmol/L — ABNORMAL LOW (ref 135–145)
Total Bilirubin: 0.9 mg/dL (ref 0.3–1.2)
Total Protein: 4.6 g/dL — ABNORMAL LOW (ref 6.5–8.1)

## 2020-02-10 LAB — CBC
HCT: 29.2 % — ABNORMAL LOW (ref 39.0–52.0)
Hemoglobin: 9.8 g/dL — ABNORMAL LOW (ref 13.0–17.0)
MCH: 29.5 pg (ref 26.0–34.0)
MCHC: 33.6 g/dL (ref 30.0–36.0)
MCV: 88 fL (ref 80.0–100.0)
Platelets: 173 10*3/uL (ref 150–400)
RBC: 3.32 MIL/uL — ABNORMAL LOW (ref 4.22–5.81)
RDW: 16.3 % — ABNORMAL HIGH (ref 11.5–15.5)
WBC: 16 10*3/uL — ABNORMAL HIGH (ref 4.0–10.5)
nRBC: 0 % (ref 0.0–0.2)

## 2020-02-10 LAB — MAGNESIUM: Magnesium: 1.8 mg/dL (ref 1.7–2.4)

## 2020-02-10 LAB — C-REACTIVE PROTEIN: CRP: 17.2 mg/dL — ABNORMAL HIGH (ref <1.0)

## 2020-02-10 LAB — BRAIN NATRIURETIC PEPTIDE: B Natriuretic Peptide: 39.1 pg/mL (ref 0.0–100.0)

## 2020-02-10 LAB — D-DIMER, QUANTITATIVE: D-Dimer, Quant: 3.05 ug/mL-FEU — ABNORMAL HIGH (ref 0.00–0.50)

## 2020-02-10 MED ORDER — FUROSEMIDE 10 MG/ML IJ SOLN
20.0000 mg | Freq: Once | INTRAMUSCULAR | Status: AC
  Administered 2020-02-10: 08:00:00 20 mg via INTRAVENOUS
  Filled 2020-02-10: qty 2

## 2020-02-10 MED ORDER — MAGIC MOUTHWASH W/LIDOCAINE
10.0000 mL | Freq: Three times a day (TID) | ORAL | Status: DC
  Administered 2020-02-10 – 2020-02-13 (×8): 10 mL via ORAL
  Filled 2020-02-10 (×17): qty 10

## 2020-02-10 NOTE — Progress Notes (Signed)
Called pts brother Laveda Abbe) to give an update. No answer.

## 2020-02-11 LAB — CBC WITH DIFFERENTIAL/PLATELET
Abs Immature Granulocytes: 0.11 10*3/uL — ABNORMAL HIGH (ref 0.00–0.07)
Basophils Absolute: 0 10*3/uL (ref 0.0–0.1)
Basophils Relative: 0 %
Eosinophils Absolute: 0.1 10*3/uL (ref 0.0–0.5)
Eosinophils Relative: 1 %
HCT: 22.7 % — ABNORMAL LOW (ref 39.0–52.0)
Hemoglobin: 7.8 g/dL — ABNORMAL LOW (ref 13.0–17.0)
Immature Granulocytes: 1 %
Lymphocytes Relative: 7 %
Lymphs Abs: 0.9 10*3/uL (ref 0.7–4.0)
MCH: 30 pg (ref 26.0–34.0)
MCHC: 34.4 g/dL (ref 30.0–36.0)
MCV: 87.3 fL (ref 80.0–100.0)
Monocytes Absolute: 1.1 10*3/uL — ABNORMAL HIGH (ref 0.1–1.0)
Monocytes Relative: 8 %
Neutro Abs: 10.7 10*3/uL — ABNORMAL HIGH (ref 1.7–7.7)
Neutrophils Relative %: 83 %
Platelets: 182 10*3/uL (ref 150–400)
RBC: 2.6 MIL/uL — ABNORMAL LOW (ref 4.22–5.81)
RDW: 16.1 % — ABNORMAL HIGH (ref 11.5–15.5)
WBC: 12.9 10*3/uL — ABNORMAL HIGH (ref 4.0–10.5)
nRBC: 0 % (ref 0.0–0.2)

## 2020-02-11 LAB — GLUCOSE, CAPILLARY
Glucose-Capillary: 183 mg/dL — ABNORMAL HIGH (ref 70–99)
Glucose-Capillary: 186 mg/dL — ABNORMAL HIGH (ref 70–99)
Glucose-Capillary: 210 mg/dL — ABNORMAL HIGH (ref 70–99)
Glucose-Capillary: 192 mg/dL — ABNORMAL HIGH (ref 70–99)
Glucose-Capillary: 206 mg/dL — ABNORMAL HIGH (ref 70–99)

## 2020-02-11 LAB — CBC
HCT: 22.3 % — ABNORMAL LOW (ref 39.0–52.0)
Hemoglobin: 7.4 g/dL — ABNORMAL LOW (ref 13.0–17.0)
MCH: 29.5 pg (ref 26.0–34.0)
MCHC: 33.2 g/dL (ref 30.0–36.0)
MCV: 88.8 fL (ref 80.0–100.0)
Platelets: 166 10*3/uL (ref 150–400)
RBC: 2.51 MIL/uL — ABNORMAL LOW (ref 4.22–5.81)
RDW: 16.1 % — ABNORMAL HIGH (ref 11.5–15.5)
WBC: 12.6 10*3/uL — ABNORMAL HIGH (ref 4.0–10.5)
nRBC: 0 % (ref 0.0–0.2)

## 2020-02-11 LAB — COMPREHENSIVE METABOLIC PANEL
ALT: 22 U/L (ref 0–44)
AST: 20 U/L (ref 15–41)
Albumin: 1.5 g/dL — ABNORMAL LOW (ref 3.5–5.0)
Alkaline Phosphatase: 44 U/L (ref 38–126)
Anion gap: 9 (ref 5–15)
BUN: 33 mg/dL — ABNORMAL HIGH (ref 8–23)
CO2: 20 mmol/L — ABNORMAL LOW (ref 22–32)
Calcium: 7.5 mg/dL — ABNORMAL LOW (ref 8.9–10.3)
Chloride: 106 mmol/L (ref 98–111)
Creatinine, Ser: 2.02 mg/dL — ABNORMAL HIGH (ref 0.61–1.24)
GFR calc Af Amer: 35 mL/min — ABNORMAL LOW (ref >60)
GFR calc non Af Amer: 30 mL/min — ABNORMAL LOW (ref >60)
Glucose, Bld: 165 mg/dL — ABNORMAL HIGH (ref 70–99)
Potassium: 3.6 mmol/L (ref 3.5–5.1)
Sodium: 135 mmol/L (ref 135–145)
Total Bilirubin: 1.1 mg/dL (ref 0.3–1.2)
Total Protein: 4.9 g/dL — ABNORMAL LOW (ref 6.5–8.1)

## 2020-02-11 LAB — BRAIN NATRIURETIC PEPTIDE: B Natriuretic Peptide: 37 pg/mL (ref 0.0–100.0)

## 2020-02-11 LAB — C-REACTIVE PROTEIN: CRP: 16.5 mg/dL — ABNORMAL HIGH (ref <1.0)

## 2020-02-11 LAB — MAGNESIUM: Magnesium: 1.7 mg/dL (ref 1.7–2.4)

## 2020-02-11 LAB — D-DIMER, QUANTITATIVE: D-Dimer, Quant: 2.41 ug/mL-FEU — ABNORMAL HIGH (ref 0.00–0.50)

## 2020-02-11 MED ORDER — METOPROLOL TARTRATE 5 MG/5ML IV SOLN
2.5000 mg | Freq: Once | INTRAVENOUS | Status: AC
  Administered 2020-02-11: 2.5 mg via INTRAVENOUS
  Filled 2020-02-11: qty 5

## 2020-02-11 NOTE — Progress Notes (Addendum)
Physical Therapy Treatment Patient Details Name: Bob Vazquez MRN: [id number] DOB: [birth year] Today's Date: 02/11/2020    History of Present Illness 79 year old male with a history of type 2 diabetes, hypertension, hyperlipidemia, NPH (with VP shunt) who lives alone and family had not heard from him in several days.  Law enforcement did a wellness check 02/02/2020 and found patient in an empty bathtub.  No signs of trauma.  Patient has been awake though confused and was found to have UTI, likely sepsis and DKA and also Covid-19 positive. Progress with therapy limited by orthostasis. The patient is apparently cared for by his nephew who is hospitalized at [facility] with Covid-19. s/p 2/12 upper GI endoscopy and GI bleed identified.    PT Comments    Pt is lethargic on entry requiring increased cuing to open his eyes for participation, however much more engaged by end of session. Pt has mild orthostatic response (see table below) but does not have complaints of increase dizziness with mobility today. Pt continues to be limited in safe mobility by decreased strength, balance and endurance. Pt is modAx2 for bed mobility, transfers and short distance ambulation. D/c plans remain appropriate. PT will continue to follow acutely.  Orthostatic BPs  Supine 119/49  Sitting 109/55  Sitting after ambulation  97/45  Sitting after 3 min 112/40        Follow Up Recommendations  SNF     Equipment Recommendations  Other (comment)(TBD)       Precautions / Restrictions Precautions Precautions: Fall;Other (comment)(Airborne) Restrictions Weight Bearing Restrictions: No    Mobility  Bed Mobility Overal bed mobility: Needs Assistance Bed Mobility: Supine to Sit;Sit to Supine     Supine to sit: Mod assist;+2 for physical assistance     General bed mobility comments: Pt with increased lethargy, requiring assistance to advance B LEs and to elevate trunk  Transfers Overall transfer level: Needs  assistance Equipment used: Rolling walker (2 wheeled) Transfers: Sit to/from Stand Sit to Stand: Mod assist;+2 physical assistance;+2 safety/equipment Stand pivot transfers: Mod assist       General transfer comment: Pt Mod A for Sit to stand using RW, pt denies increased dizziness today, once in recliner pt reports needing to use BSC, modA for stand pivot transfer to and from [facility]  Ambulation/Gait Ambulation/Gait assistance: Mod assist;+2 physical assistance Gait Distance (Feet): 3 Feet Assistive device: Rolling walker (2 wheeled) Gait Pattern/deviations: Step-to pattern;Decreased step length - right;Decreased step length - left Gait velocity: slowed Gait velocity interpretation: <1.31 ft/sec, indicative of household ambulator General Gait Details: modAx2 for steadying for ambulation to recliner         Balance Overall balance assessment: Needs assistance Sitting-balance support: Bilateral upper extremity supported;Feet supported Sitting balance-Leahy Scale: Fair     Standing balance support: Bilateral upper extremity supported Standing balance-Leahy Scale: Poor Standing balance comment: requires B UE support for static standing for pericare                            Cognition Arousal/Alertness: Lethargic Behavior During Therapy: WFL for tasks assessed/performed Overall Cognitive Status: No family/caregiver present to determine baseline cognitive functioning                                 General Comments: Follows one step commands, requires increased cuing intially for participation          General Comments  General comments (skin integrity, edema, etc.): Pt on 2L O2 via [place] on entry with SaO2 96%O2, removed supplemental O2 and with ambulation to chair SaO2 dropped to 84%O2 returned supplemental O2 and instructed pt in pursed lipped breathing and SaO2 rebounded to 90%O2. With mobility HR increased to 134 bpm       Pertinent Vitals/Pain Pain  Assessment: Faces Faces Pain Scale: Hurts a little bit Pain Location: R arm (from blood transfusion site) Pain Descriptors / Indicators: Burning Pain Intervention(s): Limited activity within patient's tolerance;Monitored during session;Repositioned           PT Goals (current goals can now be found in the care plan section) Acute Rehab PT Goals Patient Stated Goal: get stronger PT Goal Formulation: With patient Time For Goal Achievement: March 02, 2020 Potential to Achieve Goals: Good Progress towards PT goals: Progressing toward goals    Frequency    Min 2X/week      PT Plan Current plan remains appropriate       AM-PAC PT "6 Clicks" Mobility   Outcome Measure  Help needed turning from your back to your side while in a flat bed without using bedrails?: A Little Help needed moving from lying on your back to sitting on the side of a flat bed without using bedrails?: A Lot Help needed moving to and from a bed to a chair (including a wheelchair)?: Total Help needed standing up from a chair using your arms (e.g., wheelchair or bedside chair)?: Total Help needed to walk in hospital room?: Total Help needed climbing 3-5 steps with a railing? : Total 6 Click Score: 9    End of Session Equipment Utilized During Treatment: Gait belt;Oxygen Activity Tolerance: Patient tolerated treatment well Patient left: with call bell/phone within reach;in chair;with chair alarm set;with nursing/sitter in room Nurse Communication: Mobility status PT Visit Diagnosis: Unsteadiness on feet (R26.81);Muscle weakness (generalized) (M62.81)     Time: [id number] PT Time Calculation (min) (ACUTE ONLY): 42 min  Charges:  $Gait Training: 8-22 mins $Therapeutic Activity: 23-37 mins                     Shaelynn Dragos B. Migdalia Dk PT, DPT Acute Rehabilitation Services Pager [phone] Office [phone]    [place] 02/11/2020, 1:57 PM

## 2020-02-11 NOTE — Progress Notes (Signed)
Bob Vazquez returned my page about the pts HR staying in the 120s. Metoprolol was given per order. Will continue to monitor.

## 2020-02-11 NOTE — Progress Notes (Signed)
Spoke with Elio Forget, gave updates/answered questions. Will continue to monitor.

## 2020-02-11 NOTE — TOC Progression Note (Signed)
Transition of Care [facility] - Progression Note    Patient Details  Name: Bob Vazquez MRN: [id number] Date of Birth: 15-Jun-1941  Transition of Care [facility]) CM/SW [place], LCSW Phone Number: 02/11/2020, 2:52 PM  Clinical Narrative:    CSW submitted clinicals to insurance for new authorization and updated patient's niece and brother on possible discharge tomorrow to [facility].    Expected Discharge Plan: [place] Barriers to Discharge: Continued Medical Work up  Expected Discharge Plan and Services Expected Discharge Plan: [place] arrangements for the past 2 months: Single Family Home                                       Social Determinants of Health (SDOH) Interventions    Readmission Risk Interventions No flowsheet data found.

## 2020-02-11 NOTE — Progress Notes (Signed)
Paged Beaulah Corin regarding HR and O2 sat. HR did not change after metoprolol was given. O2 fluctuating in the 80s. 86% on 2L [place]. Bumped the pt up to 4L. BP 118/50.  Will continue to monitor.

## 2020-02-12 ENCOUNTER — Inpatient Hospital Stay (HOSPITAL_COMMUNITY): Payer: Medicare Other

## 2020-02-12 ENCOUNTER — Encounter: Payer: Self-pay | Admitting: *Deleted

## 2020-02-12 LAB — COMPREHENSIVE METABOLIC PANEL
ALT: 20 U/L (ref 0–44)
AST: 18 U/L (ref 15–41)
Albumin: 1.4 g/dL — ABNORMAL LOW (ref 3.5–5.0)
Alkaline Phosphatase: 43 U/L (ref 38–126)
Anion gap: 11 (ref 5–15)
BUN: 32 mg/dL — ABNORMAL HIGH (ref 8–23)
CO2: 16 mmol/L — ABNORMAL LOW (ref 22–32)
Calcium: 7.3 mg/dL — ABNORMAL LOW (ref 8.9–10.3)
Chloride: 106 mmol/L (ref 98–111)
Creatinine, Ser: 2.04 mg/dL — ABNORMAL HIGH (ref 0.61–1.24)
GFR calc Af Amer: 35 mL/min — ABNORMAL LOW (ref >60)
GFR calc non Af Amer: 30 mL/min — ABNORMAL LOW (ref >60)
Glucose, Bld: 208 mg/dL — ABNORMAL HIGH (ref 70–99)
Potassium: 3.4 mmol/L — ABNORMAL LOW (ref 3.5–5.1)
Sodium: 133 mmol/L — ABNORMAL LOW (ref 135–145)
Total Bilirubin: 1.5 mg/dL — ABNORMAL HIGH (ref 0.3–1.2)
Total Protein: 4.8 g/dL — ABNORMAL LOW (ref 6.5–8.1)

## 2020-02-12 LAB — CBC WITH DIFFERENTIAL/PLATELET
Abs Immature Granulocytes: 0.09 10*3/uL — ABNORMAL HIGH (ref 0.00–0.07)
Basophils Absolute: 0 10*3/uL (ref 0.0–0.1)
Basophils Relative: 0 %
Eosinophils Absolute: 0.1 10*3/uL (ref 0.0–0.5)
Eosinophils Relative: 1 %
HCT: 21.5 % — ABNORMAL LOW (ref 39.0–52.0)
Hemoglobin: 7.1 g/dL — ABNORMAL LOW (ref 13.0–17.0)
Immature Granulocytes: 1 %
Lymphocytes Relative: 7 %
Lymphs Abs: 0.8 10*3/uL (ref 0.7–4.0)
MCH: 29.3 pg (ref 26.0–34.0)
MCHC: 33 g/dL (ref 30.0–36.0)
MCV: 88.8 fL (ref 80.0–100.0)
Monocytes Absolute: 0.8 10*3/uL (ref 0.1–1.0)
Monocytes Relative: 7 %
Neutro Abs: 9.3 10*3/uL — ABNORMAL HIGH (ref 1.7–7.7)
Neutrophils Relative %: 84 %
Platelets: 164 10*3/uL (ref 150–400)
RBC: 2.42 MIL/uL — ABNORMAL LOW (ref 4.22–5.81)
RDW: 16.1 % — ABNORMAL HIGH (ref 11.5–15.5)
WBC: 11.1 10*3/uL — ABNORMAL HIGH (ref 4.0–10.5)
nRBC: 0 % (ref 0.0–0.2)

## 2020-02-12 LAB — GLUCOSE, CAPILLARY
Glucose-Capillary: 244 mg/dL — ABNORMAL HIGH (ref 70–99)
Glucose-Capillary: 180 mg/dL — ABNORMAL HIGH (ref 70–99)
Glucose-Capillary: 296 mg/dL — ABNORMAL HIGH (ref 70–99)
Glucose-Capillary: 196 mg/dL — ABNORMAL HIGH (ref 70–99)
Glucose-Capillary: 296 mg/dL — ABNORMAL HIGH (ref 70–99)

## 2020-02-12 LAB — MAGNESIUM: Magnesium: 1.7 mg/dL (ref 1.7–2.4)

## 2020-02-12 LAB — C-REACTIVE PROTEIN: CRP: 23 mg/dL — ABNORMAL HIGH (ref <1.0)

## 2020-02-12 LAB — PROCALCITONIN: Procalcitonin: 3.27 ng/mL

## 2020-02-12 LAB — BRAIN NATRIURETIC PEPTIDE: B Natriuretic Peptide: 66.5 pg/mL (ref 0.0–100.0)

## 2020-02-12 MED ORDER — GUAIFENESIN-DM 100-10 MG/5ML PO SYRP
10.0000 mL | ORAL_SOLUTION | ORAL | Status: DC | PRN
  Administered 2020-02-12 (×3): 10 mL via ORAL
  Filled 2020-02-12 (×3): qty 10

## 2020-02-12 MED ORDER — BOOST / RESOURCE BREEZE PO LIQD CUSTOM
1.0000 | Freq: Four times a day (QID) | ORAL | Status: DC
  Administered 2020-02-12 – 2020-02-13 (×2): 1 via ORAL

## 2020-02-12 MED ORDER — METOPROLOL TARTRATE 5 MG/5ML IV SOLN
5.0000 mg | Freq: Once | INTRAVENOUS | Status: AC
  Administered 2020-02-12: 18:00:00 5 mg via INTRAVENOUS
  Filled 2020-02-12: qty 5

## 2020-02-12 MED ORDER — MORPHINE SULFATE (PF) 2 MG/ML IV SOLN
1.0000 mg | Freq: Once | INTRAVENOUS | Status: AC
  Administered 2020-02-12: 1 mg via INTRAVENOUS
  Filled 2020-02-12: qty 1

## 2020-02-12 MED ORDER — PRO-STAT SUGAR FREE PO LIQD
60.0000 mL | Freq: Two times a day (BID) | ORAL | Status: DC
  Administered 2020-02-13: 60 mL via ORAL
  Filled 2020-02-12: qty 60

## 2020-02-12 NOTE — Progress Notes (Signed)
Occupational Therapy Treatment Patient Details Name: Bob Vazquez MRN: [id number] DOB: 10/27/1941 Today's Date: 02/12/2020    History of present illness 79 year old male with a history of type 2 diabetes, hypertension, hyperlipidemia, NPH (with VP shunt) who lives alone and family had not heard from him in several days.  Law enforcement did a wellness check 02/11/2020 and found patient in an empty bathtub.  No signs of trauma.  Patient has been awake though confused and was found to have UTI, likely sepsis and DKA and also Covid-19 positive. Progress with therapy limited by orthostasis. The patient is apparently cared for by his nephew who is hospitalized at [facility] with Covid-19. s/p 2/12 upper GI endoscopy and GI bleed identified.   OT comments  Pt with increased need for O2 this AM from 2 L O2 to 6 L O2. Pt lethargic today and continues to be limited by decreased strength and endurance. Pt Mod A for bed mobility to sit EOB, sit to stand, and stand pivot to recliner chair without AD. Pt O2 stats dropping to low 80s with activity, increasing to 98% on 6 L O2 after >3 min rest break. Pt Max A x 2 for LB bathing for peri care due to incontinence while standing. Pt Setup for washing face while seated. Assessed ability to use incentive spirometer with pt only able to pull 23mL x 5 attempts with frequent coughing. DC recommendation remains appropriate. Will continue to follow acutely.    Follow Up Recommendations  SNF;Supervision/Assistance - 24 hour    Equipment Recommendations  3 in 1 bedside commode;Other (comment)    Recommendations for Other Services      Precautions / Restrictions Precautions Precautions: Fall;Other (comment) Restrictions Weight Bearing Restrictions: No       Mobility Bed Mobility Overal bed mobility: Needs Assistance Bed Mobility: Supine to Sit     Supine to sit: Mod assist     General bed mobility comments: Pt with increased lethargy, requiring assistance to  advance B LEs and to elevate trunk  Transfers Overall transfer level: Needs assistance Equipment used: None Transfers: Sit to/from [facility] Sit to Stand: Mod assist Stand pivot transfers: Mod assist       General transfer comment: Pt Mod A without AD, Mod A for advancement to recliner with weakness and small steps noted    Balance                                           ADL either performed or assessed with clinical judgement   ADL Overall ADL's : Needs assistance/impaired     Grooming: Set up;Wash/dry face;Sitting       Lower Body Bathing: Maximal assistance;+2 for safety/equipment;Sit to/from stand Lower Body Bathing Details (indicate cue type and reason): Pt required OT assistance to maintain standing while staff member assisted with peri care`                     Functional mobility during ADLs: Moderate assistance;+2 for physical assistance;+2 for safety/equipment;Rolling walker General ADL Comments: Pt with increased need for O2, apparent decreased weakness and alertness during session     Vision       Perception     Praxis      Cognition Arousal/Alertness: Lethargic Behavior During Therapy: WFL for tasks assessed/performed Overall Cognitive Status: No family/caregiver present to determine baseline cognitive functioning  General Comments: Follows one step commands, requires increased cuing intially for participation         Exercises Exercises: Other exercises Other Exercises Other Exercises: Incentive spirometer (250 mL) with frequent coughing   Shoulder Instructions       General Comments Pt with increased O2 need with 6 L O2, dropping in 80s during exertion, recovered to 98% after > 3 min rest break. Pt unable to successfully pull from IS (250 mL) with frequent coughing    Pertinent Vitals/ Pain       Pain Assessment: No/denies pain  Home Living                                           Prior Functioning/Environment              Frequency  Min 2X/week        Progress Toward Goals  OT Goals(current goals can now be found in the care plan section)  Progress towards OT goals: Progressing toward goals  Acute Rehab OT Goals Patient Stated Goal: get stronger OT Goal Formulation: With patient Time For Goal Achievement: February 18, 2020 Potential to Achieve Goals: Good ADL Goals Pt Will Perform Grooming: with supervision;sitting;standing Pt Will Perform Lower Body Bathing: with supervision;sitting/lateral leans;sit to/from stand Pt Will Perform Upper Body Dressing: with set-up;with supervision;sitting Pt Will Perform Lower Body Dressing: with supervision;sit to/from stand;sitting/lateral leans Pt Will Transfer to Toilet: with supervision;ambulating Pt Will Perform Toileting - Clothing Manipulation and hygiene: with supervision;sit to/from stand;sitting/lateral leans Additional ADL Goal #1: Pt will demonstrate anticipatory awareness during ADL/mobility completion.  Plan Discharge plan remains appropriate    Co-evaluation                 AM-PAC OT "6 Clicks" Daily Activity     Outcome Measure   Help from another person eating meals?: A Little Help from another person taking care of personal grooming?: A Little Help from another person toileting, which includes using toliet, bedpan, or urinal?: A Lot Help from another person bathing (including washing, rinsing, drying)?: A Lot Help from another person to put on and taking off regular upper body clothing?: A Little Help from another person to put on and taking off regular lower body clothing?: A Lot 6 Click Score: 15    End of Session Equipment Utilized During Treatment: Gait belt;Oxygen  OT Visit Diagnosis: Muscle weakness (generalized) (M62.81);Unsteadiness on feet (R26.81);Other symptoms and signs involving cognitive function   Activity Tolerance  Patient limited by fatigue   Patient Left in chair;with call bell/phone within reach;with chair alarm set;Other (comment)(with RN in room)   Nurse Communication Mobility status;Other (comment)(O2, MD request for pt in chair)        Time: [id number] OT Time Calculation (min): 26 min  Charges: OT General Charges $OT Visit: 1 Visit OT Treatments $Self Care/Home Management : 8-22 mins $Therapeutic Activity: 8-22 mins  Layla Maw, OTR/L   Layla Maw 02/12/2020, 2:24 PM

## 2020-02-12 NOTE — Progress Notes (Signed)
Nutrition Follow-up   RD working remotely.  DOCUMENTATION CODES:   Not applicable  INTERVENTION:  Provide Boost Breeze po QID, each supplement provides 250 kcal and 9 grams of protein.  Provide 60 ml Prostat po BID, each supplement provides 100 kcal and 15 grams of protein.   Encourage adequate PO intake.   NUTRITION DIAGNOSIS:   Increased nutrient needs related to catabolic [id number]) as evidenced by estimated needs; ongoing  GOAL:   Patient will meet greater than or equal to 90% of their needs; progressing  MONITOR:   PO intake, Supplement acceptance, Skin, Weight trends, Labs, I & O's  REASON FOR ASSESSMENT:   Malnutrition Screening Tool    ASSESSMENT:   79 year old male with PMHx of HTN, HLD, chronic bronchitis, DM type 2 admitted withacute metabolic encephalopathy in the setting of severe sepsis due to UTI, DKA and COVID-19 pneumonia, also with odynophagia due to severe erosive esophagitis. Underwent upper endoscopy 2/16.   Pt currently on 3 L HFNC. Pt continues on a soft diet with thin liquids. Meal completion has been 25-50%. Pt currently has Boost Breeze and Prostat and has been consuming most of them. RD to increase nutritional supplements to aid in caloric and protein needs as po intake has been poor.    Labs and medications reviewed.   Diet Order:   Diet Order            DIET SOFT Room service appropriate? Yes; Fluid consistency: Thin  Diet effective now              EDUCATION NEEDS:   No education needs have been identified at this time  Skin:  Skin Assessment: Reviewed RN Assessment  Last BM:  2/19  Height:   Ht Readings from Last 1 Encounters:  02/01/2020 5\' 10"  (1.778 m)    Weight:   Wt Readings from Last 1 Encounters:  02/12/20 75.9 kg    Ideal Body Weight:  75.5 kg  BMI:  Body mass index is 24.01 kg/m.  Estimated Nutritional Needs:   Kcal:  2000-2200  Protein:  100-110 grams  Fluid:  2 L/day    Corrin Parker, MS, RD, LDN RD pager number/after hours weekend pager number on Amion.

## 2020-02-12 NOTE — Significant Event (Signed)
Rapid Response Event Note  Overview:Called d/t increased FiO2 demands. Pt was on 2L [place] at the beginning of shift and is now requiring 6L [place].  Event Type: Respiratory  Called: [phone] Arrived:0325  Initial Focused Assessment: Pt laying in bed with eyes closed. Pt alert and oriented, follows commands, and move all extremities, denies chest pain. Pt in mild respiratory distress. Lungs clear and diminished. Skin warm and dry. T-99, HR-110, BP-108/52, RR-24, SpO2-87% on 6L [place].  Pt has frequent nonproductive cough.   Interventions: PCXR-No change in multifocal pneumonia. HFNC-6L>titrate for SpO2 goal of 90%(per previous MD order) Plan of Care (if not transferred): Titrate FiO2 for SpO2-90%. Treat cough with tessalon prn as this seems to be a component of his sats dropping. Continue to monitor pt. Call RRT if further assistance needed.  Event Summary: Name of Physician Notified: Louretta Parma, NP at (PTA RRT)    at    Outcome: Stayed in room and stabalized   Event End: [id number]  Dillard [place]

## 2020-02-12 NOTE — Progress Notes (Signed)
Rapid response called due to pt O2 sat fluctuating in the 80s Pt started at 2L [place] while in the low 90s. Put him on 4L when it started to decrease. Now on 6L until rapid responds.

## 2020-02-12 NOTE — Progress Notes (Signed)
Notified K. Eubanks of rapid response notification due to O2 being in the low 80s on 4L [place] and high HR. BP stable.

## 2020-02-13 ENCOUNTER — Inpatient Hospital Stay (HOSPITAL_COMMUNITY): Payer: Medicare Other

## 2020-02-13 LAB — COMPREHENSIVE METABOLIC PANEL
ALT: 19 U/L (ref 0–44)
AST: 18 U/L (ref 15–41)
Albumin: 1.3 g/dL — ABNORMAL LOW (ref 3.5–5.0)
Alkaline Phosphatase: 60 U/L (ref 38–126)
Anion gap: 10 (ref 5–15)
BUN: 31 mg/dL — ABNORMAL HIGH (ref 8–23)
CO2: 19 mmol/L — ABNORMAL LOW (ref 22–32)
Calcium: 7.4 mg/dL — ABNORMAL LOW (ref 8.9–10.3)
Chloride: 107 mmol/L (ref 98–111)
Creatinine, Ser: 2.18 mg/dL — ABNORMAL HIGH (ref 0.61–1.24)
GFR calc Af Amer: 32 mL/min — ABNORMAL LOW (ref >60)
GFR calc non Af Amer: 28 mL/min — ABNORMAL LOW (ref >60)
Glucose, Bld: 193 mg/dL — ABNORMAL HIGH (ref 70–99)
Potassium: 3.1 mmol/L — ABNORMAL LOW (ref 3.5–5.1)
Sodium: 136 mmol/L (ref 135–145)
Total Bilirubin: 1.3 mg/dL — ABNORMAL HIGH (ref 0.3–1.2)
Total Protein: 5.2 g/dL — ABNORMAL LOW (ref 6.5–8.1)

## 2020-02-13 LAB — CBC WITH DIFFERENTIAL/PLATELET
Abs Immature Granulocytes: 0.06 10*3/uL (ref 0.00–0.07)
Basophils Absolute: 0 10*3/uL (ref 0.0–0.1)
Basophils Relative: 0 %
Eosinophils Absolute: 0.1 10*3/uL (ref 0.0–0.5)
Eosinophils Relative: 1 %
HCT: 23 % — ABNORMAL LOW (ref 39.0–52.0)
Hemoglobin: 7.7 g/dL — ABNORMAL LOW (ref 13.0–17.0)
Immature Granulocytes: 1 %
Lymphocytes Relative: 5 %
Lymphs Abs: 0.6 10*3/uL — ABNORMAL LOW (ref 0.7–4.0)
MCH: 29.5 pg (ref 26.0–34.0)
MCHC: 33.5 g/dL (ref 30.0–36.0)
MCV: 88.1 fL (ref 80.0–100.0)
Monocytes Absolute: 0.6 10*3/uL (ref 0.1–1.0)
Monocytes Relative: 5 %
Neutro Abs: 9.5 10*3/uL — ABNORMAL HIGH (ref 1.7–7.7)
Neutrophils Relative %: 88 %
Platelets: 205 10*3/uL (ref 150–400)
RBC: 2.61 MIL/uL — ABNORMAL LOW (ref 4.22–5.81)
RDW: 15.9 % — ABNORMAL HIGH (ref 11.5–15.5)
WBC: 10.7 10*3/uL — ABNORMAL HIGH (ref 4.0–10.5)
nRBC: 0 % (ref 0.0–0.2)

## 2020-02-13 LAB — BRAIN NATRIURETIC PEPTIDE: B Natriuretic Peptide: 67.8 pg/mL (ref 0.0–100.0)

## 2020-02-13 LAB — GLUCOSE, CAPILLARY
Glucose-Capillary: 188 mg/dL — ABNORMAL HIGH (ref 70–99)
Glucose-Capillary: 261 mg/dL — ABNORMAL HIGH (ref 70–99)
Glucose-Capillary: 400 mg/dL — ABNORMAL HIGH (ref 70–99)
Glucose-Capillary: 447 mg/dL — ABNORMAL HIGH (ref 70–99)
Glucose-Capillary: 456 mg/dL — ABNORMAL HIGH (ref 70–99)

## 2020-02-13 LAB — MAGNESIUM: Magnesium: 1.8 mg/dL (ref 1.7–2.4)

## 2020-02-13 LAB — PROCALCITONIN: Procalcitonin: 7.91 ng/mL

## 2020-02-13 LAB — C-REACTIVE PROTEIN: CRP: 33.5 mg/dL — ABNORMAL HIGH (ref <1.0)

## 2020-02-13 MED ORDER — FUROSEMIDE 10 MG/ML IJ SOLN
40.0000 mg | Freq: Once | INTRAMUSCULAR | Status: AC
  Administered 2020-02-13: 13:00:00 40 mg via INTRAVENOUS
  Filled 2020-02-13: qty 4

## 2020-02-13 MED ORDER — DEXTROSE 50 % IV SOLN: 0.0000 mL | INTRAVENOUS | Status: DC | PRN

## 2020-02-13 MED ORDER — METHYLPREDNISOLONE SODIUM SUCC 125 MG IJ SOLR
60.0000 mg | Freq: Two times a day (BID) | INTRAMUSCULAR | Status: DC
  Administered 2020-02-13: 17:00:00 60 mg via INTRAVENOUS
  Filled 2020-02-13: qty 2

## 2020-02-13 MED ORDER — INSULIN ASPART 100 UNIT/ML ~~LOC~~ SOLN
25.0000 [IU] | Freq: Once | SUBCUTANEOUS | Status: DC
  Administered 2020-02-13: 15:00:00 25 [IU] via SUBCUTANEOUS

## 2020-02-13 MED ORDER — FUROSEMIDE 10 MG/ML IJ SOLN
80.0000 mg | Freq: Once | INTRAMUSCULAR | Status: AC
  Administered 2020-02-13: 09:00:00 80 mg via INTRAVENOUS
  Filled 2020-02-13: qty 8

## 2020-02-13 MED ORDER — SODIUM CHLORIDE 0.9 % IV SOLN: INTRAVENOUS | Status: DC

## 2020-02-13 MED ORDER — NITROGLYCERIN 2 % TD OINT
1.0000 [in_us] | TOPICAL_OINTMENT | Freq: Once | TRANSDERMAL | Status: AC
  Administered 2020-02-13: 09:00:00 1 [in_us] via TOPICAL
  Filled 2020-02-13: qty 30

## 2020-02-13 MED ORDER — POTASSIUM CHLORIDE 20 MEQ PO PACK
40.0000 meq | PACK | Freq: Once | ORAL | Status: AC
  Administered 2020-02-13: 40 meq via ORAL
  Filled 2020-02-13: qty 2

## 2020-02-13 MED ORDER — METOPROLOL TARTRATE 5 MG/5ML IV SOLN
5.0000 mg | INTRAVENOUS | Status: DC | PRN
  Administered 2020-02-13: 04:00:00 5 mg via INTRAVENOUS
  Filled 2020-02-13: qty 5

## 2020-02-13 MED ORDER — NITROGLYCERIN 2 % TD OINT
0.5000 [in_us] | TOPICAL_OINTMENT | Freq: Four times a day (QID) | TRANSDERMAL | Status: DC
  Administered 2020-02-13 (×2): 0.5 [in_us] via TOPICAL
  Filled 2020-02-13: qty 30

## 2020-02-13 MED ORDER — DEXTROSE-NACL 5-0.45 % IV SOLN: INTRAVENOUS | Status: DC

## 2020-02-13 MED ORDER — INSULIN GLARGINE 100 UNIT/ML ~~LOC~~ SOLN
20.0000 [IU] | Freq: Every day | SUBCUTANEOUS | Status: DC
  Filled 2020-02-13: qty 0.2

## 2020-02-13 MED ORDER — INSULIN REGULAR(HUMAN) IN NACL 100-0.9 UT/100ML-% IV SOLN
INTRAVENOUS | Status: DC
  Filled 2020-02-13: qty 100

## 2020-02-13 MED ORDER — POTASSIUM CHLORIDE 10 MEQ/100ML IV SOLN
10.0000 meq | INTRAVENOUS | Status: AC
  Administered 2020-02-13 (×3): 10 meq via INTRAVENOUS
  Filled 2020-02-13 (×3): qty 100

## 2020-02-13 MED ORDER — MORPHINE 100MG IN NS 100ML (1MG/ML) PREMIX INFUSION
5.0000 mg/h | INTRAVENOUS | Status: DC
  Administered 2020-02-13 – 2020-02-14 (×2): 5 mg/h via INTRAVENOUS
  Filled 2020-02-13 (×2): qty 100

## 2020-02-13 MED ORDER — INSULIN GLARGINE 100 UNIT/ML ~~LOC~~ SOLN: 20.0000 [IU] | Freq: Once | SUBCUTANEOUS | Status: DC

## 2020-02-13 MED ORDER — LACTATED RINGERS IV SOLN: INTRAVENOUS | Status: DC

## 2020-02-13 NOTE — Progress Notes (Addendum)
PROGRESS NOTE                                                                                                                                                                                                             Patient Demographics:    Bob Vazquez, is a 79 y.o. male, DOB - 1941-01-30, [id number]  Outpatient Primary MD for the patient is Nolene Ebbs, MD   Admit date - 02/06/2020   LOS - 33  Chief Complaint  Patient presents with  . Altered Mental Status  . Hyperglycemia       Brief Narrative: Patient is a 79 y.o. male with PMHx of DM-2, HTN, HLD, NPH-who was found very confused in an empty bathtub on a wellness check by law enforcement (family had not heard from him for several days)-he was subsequently brought to the ED-and was found to have acute metabolic encephalopathy in the setting of severe sepsis due to UTI, DKA and COVID-19 pneumonia.  He was admitted to the [facility] clinical stability transferred to the [facility] on 2/9.  Further hospital course has been complicated by odynophagia due to severe erosive esophagitis-acute blood loss anemia likely due to upper GI bleeding.  See below for further details.  Patient initially showed improvement however around 02/12/2020 he started getting more short of breath, initially it was thought that he was aspirating or he was in CHF, he was made n.p.o. and given Lasix but he did not improve.  Subsequently his chest x-ray started to show worsening interstitial infiltrates and CRP started to rise on 02/13/2020 suggesting he might have a second surge of Covid pneumonia.  He had already completed his remdesivir treatment and was not a Actemra candidate due to GI bleed this admission, he was started back on IV steroids.  Unfortunately he continued to decline and as per his wishes he will be transition to full comfort care with  morphine drip at 4:55 PM on 02/13/2020.  I expect him to pass away soon. Family informed.    Subjective:   Patient in bed appears short of breath, says he has no chest or abdominal pain but definitely has  shortness of breath and problems laying flat.   Assessment  & Plan :   Odynophagia: Continues to have severe odynophagia to both solids and liquids-in spite of being on Diflucan, PPI and Carafate.  Barium esophagogram on 2/8 showed some mild smooth narrowing in the distal esophagus.  EGD completed on 2/12-showed severe esophagitis and gastric ulcer with visible vessel/and a nonbleeding duodenal ulcer with an adherent clot.  No evidence of Candida, Magic mouthwash with lidocaine added premeals with some improvement in his odynophagia, GI and speech following.  Upper GI bleeding with acute blood loss anemia: Hemoglobin has been trending down-initially thought to be due to acute illness-however due to EGD findings-high suspicion that this patient is likely having a upper GI bleeding.  Did drop H&H with evidence of acute anemia due to upper GI blood loss requiring 2 units of packed RBC on 02/06/2020, IV PPI being continued, his H&H had stabilized unfortunately on 02/21/2020 it has dropped again and I think he is bleeding again, will repeat 1 unit of packed RBC on 02/13/2020, n.p.o., have discussed with GI repeat EGD soon.  Acute on chronic diastolic dysfunction.  EF 60% on recent echocardiogram.  Aggressive diuresis with Lasix, stop all IV fluids.  Oxygen supplementation, Nitropaste x1.  Monitor closely.  Could not tolerate beta-blocker earlier due to hypotension.  Current blood pressure is slightly high due to respiratory distress.  Hypokalemia.  Replaced.  Covid 19 Viral pneumonia: Stable on room air-has completed a course of remdesivir-this diagnosis is over 35 weeks old but around 02/12/2020 he is again starting to get short of breath, we initially thought he had aspiration or CHF.  But despite diuresis  and n.p.o. status his hypoxia and shortness of breath are worsening and CRP has gradually started to increase.  I am wondering if he has a late rebound COVID-19 pneumonia resurgence, not a Actemra candidate due to recent GI bleed and risk of perforation, will place him on steroids as he is already finished remdesivir and monitor closely.  Condition will be critical.   O2 requirements:  SpO2: 99 % O2 Flow Rate (L/min): Room air  COVID-19 Labs: Recent Labs    02/11/20 0446 02/12/20 0731 02/13/20 0500  DDIMER 2.41*  --   --   CRP 16.5* 23.0* 33.5*       Component Value Date/Time   BNP 66.5 02/12/2020 0731    Recent Labs  Lab 02/12/20 0732 02/13/20 0500  PROCALCITON 3.27 7.91    Lab Results  Component Value Date   SARSCOV2NAA POSITIVE (A) 02/02/2020   [place] Not Detected 07/13/2019     COVID-19 Medications: Steroids: 2/5>> Remdesivir: 2/5>> 2/9  Severe sepsis secondary to UTI: Sepsis physiology has resolved-cultures positive for Klebsiella pneumonia and Streptococcus agalactiae-blood cultures negative.  Has completed a course of antimicrobial therapy.  Antibiotics: Ceftriaxone: 2/9>>2/11 Cefepime: 2/5>> 2/8  Acute metabolic encephalopathy: Secondary to DKA/AKI-improved-suspect patient not far from usual baseline.  AKI on CKD stage IIIb: AKI likely hemodynamically mediated-creatinine continues to slightly increase-likely secondary to poor oral intake due to odynophagia-an upper GI bleeding.  Bladder scans without significant urinary retention-follow for now.  HTN: Blood pressure slightly high due to respiratory distress, low-dose Nitropaste, previously could not tolerate beta-blockers.  HLD: Statins on hold-resume in the next few days.  Orthostatic Hypotension: probably secondary to dehydration/Diabetic neuropathy-retrospectively-may have been developing a GI bleeding that could have contributed to orthostatic hypotension.  All antihypertensives on hold-gentle  IV fluid hydration in progress.   Deconditioning/debility: Secondary to acute  illness-PT/OT following-recommendations of SNF-social work consulted.  Insulin-dependent DM-2: Was in DKA initially which has resolved, restarting insulin drip on 02/13/2020 as he is being replaced on steroids due to pulmonary issues.  Lab Results  Component Value Date   HGBA1C 8.0 (H) 02/07/2020   CBG (last 3)  Recent Labs    02/12/20 2043 02/13/20 0142 02/13/20 0751  GLUCAP 296* 188* 261*     Consults  :  PCCM,GI  Procedures  :    EGD -showing severe esophagitis and ulcer  Barium swallow -assistant with esophageal stricture.  Echocardiogram    1. Left ventricular ejection fraction, by visual estimation, is 60 to 65%. The left ventricle has normal function. There is severely increased  left ventricular hypertrophy of the basal septum.  2. Left ventricular diastolic parameters are consistent with Grade I diastolic dysfunction (impaired relaxation).  3. Elevated left ventricular end-diastolic pressure.  4. Global right ventricle has normal systolic function.The right ventricular size is normal. No increase in right ventricular wall thickness.  5. Left atrial size was normal.  6. Right atrial size was normal.  7. Mild to moderate mitral annular calcification.  8. The mitral valve is normal in structure. No evidence of mitral valve regurgitation. No evidence of mitral stenosis.  9. The tricuspid valve is normal in structure. Tricuspid valve regurgitation is not demonstrated.  10. The aortic valve is tricuspid. Aortic valve regurgitation is not visualized. Mild aortic valve sclerosis without stenosis.  11. The pulmonic valve was normal in structure. Pulmonic valve regurgitation is not visualized.  12. The inferior vena cava is normal in size with greater than 50% respiratory variability, suggesting right atrial pressure of 3 mmHg.  13. TR signal is inadequate for assessing pulmonary artery  systolic pressure.  14. Poor acoustical windows limit ability to adequately assess for vegetation. No obvious massess on valve. Recommend TEE if clinically indicated.   Condition -  extremely guarded  Family Communication  : Updated patient's niece on 02/08/2020, they are aware of patient's wishes of being DNR and hospice if he declines.   Called x 2 on  02/13/20- no response but mailbox is full. Finally updated her at 4.56pm  Called brother at 4:55 PM on 02/13/2020   Code Status : DNR  Diet :  Diet Order            Diet NPO time specified Except for: Sips with Meds  Diet effective now               Disposition Plan  : SNF once improved from respiratory distress.  Antimicorbials  :    Anti-infectives (From admission, onward)   Start     Dose/Rate Route Frequency Ordered Stop   02/02/20 1400  fluconazole (DIFLUCAN) IVPB 100 mg  Status:  Discontinued     100 mg 50 mL/hr over 60 Minutes Intravenous Every 24 hours 02/02/20 1341 02/06/20 1148   02/02/20 1330  cefTRIAXone (ROCEPHIN) 1 g in sodium chloride 0.9 % 100 mL IVPB     1 g 200 mL/hr over 30 Minutes Intravenous Every 24 hours 02/02/20 1328 02/04/20 1234   01/30/20 1830  ceFEPIme (MAXIPIME) 2 g in sodium chloride 0.9 % 100 mL IVPB  Status:  Discontinued     2 g 200 mL/hr over 30 Minutes Intravenous Every 24 hours 02/18/2020 1927 02/02/20 1328   01/30/20 1000  remdesivir 100 mg in sodium chloride 0.9 % 100 mL IVPB     100 mg 200 mL/hr over 30 Minutes  Intravenous Daily 02/19/2020 2258 02/02/20 1000   02/03/2020 2300  remdesivir 200 mg in sodium chloride 0.9% 250 mL IVPB     200 mg 580 mL/hr over 30 Minutes Intravenous Once 02/08/2020 2258 01/30/20 0102   02/03/2020 1929  vancomycin variable dose per unstable renal function (pharmacist dosing)  Status:  Discontinued      Does not apply See admin instructions 01/26/2020 1929 01/30/20 1429   02/10/2020 1730  ceFEPIme (MAXIPIME) 2 g in sodium chloride 0.9 % 100 mL IVPB     2 g 200 mL/hr over  30 Minutes Intravenous  Once 02/08/2020 1716 01/31/2020 1935   02/06/2020 1730  metroNIDAZOLE (FLAGYL) IVPB 500 mg     500 mg 100 mL/hr over 60 Minutes Intravenous  Once 01/28/2020 1716 02/13/2020 2000   02/06/2020 1730  vancomycin (VANCOCIN) IVPB 1000 mg/200 mL premix     1,000 mg 200 mL/hr over 60 Minutes Intravenous  Once 01/26/2020 1716 01/25/2020 2005     DVT prophylaxis.  SCDs added.   Inpatient Medications  Scheduled Meds: . feeding supplement  1 Container Oral 4x daily  . feeding supplement (PRO-STAT SUGAR FREE 64)  60 mL Oral BID  . insulin aspart  0-9 Units Subcutaneous Q6H  . magic mouthwash w/lidocaine  10 mL Oral TID AC  . sucralfate  1 g Oral TID WC & HS   Continuous Infusions: . lactated ringers Stopped (02/13/20 0838)  . potassium chloride 10 mEq (02/13/20 0849)   PRN Meds:.   Time Spent in minutes  25  See all Orders from today for further details   Lala Lund M.D on 02/13/2020 at 9:49 AM  To page go to [email or web address] - use universal password  [facility] -  Office  [phone]    Objective:   Vitals:   02/13/20 0402 02/13/20 0428 02/13/20 0500 02/13/20 0830  BP: (!) 146/54  (!) 119/48 (!) 154/69  Pulse: (!) 125 (!) 123 (!) 106 (!) 131  Resp:   18 (!) 40  Temp:    97.6 F (36.4 C)  TempSrc:      SpO2: (!) 85% 93% 93% 95%  Weight:   71.7 kg   Height:        Wt Readings from Last 3 Encounters:  02/13/20 71.7 kg  04/12/14 81.6 kg  11/12/13 81.6 kg     Intake/Output Summary (Last 24 hours) at 02/13/2020 0949 Last data filed at 02/13/2020 0600 Gross per 24 hour  Intake --  Output 850 ml  Net -850 ml     Physical Exam  Awake but looks SOB [place].AT,PERRAL Supple Neck,No JVD, No cervical lymphadenopathy appriciated.  Symmetrical Chest wall movement, Good air movement bilaterally, ++ rales RRR,No Gallops, Rubs or new Murmurs, No Parasternal Heave +ve B.Sounds, Abd Soft, No tenderness, No organomegaly appriciated, No rebound - guarding or  rigidity. No Cyanosis, Clubbing or edema, No new Rash or bruise      Data Review:    CBC Recent Labs  Lab 02/04/2020 0350 02/09/20 1600 02/10/20 0443 02/10/20 0443 02/10/20 1336 02/11/20 0446 02/11/20 0759 02/12/20 0731 02/13/20 0500  WBC 12.0*  --  11.7*   < > 16.0* 12.9* 12.6* 11.1* 10.7*  HGB 7.9*   < > 7.9*   < > 9.8* 7.8* 7.4* 7.1* 7.7*  HCT 24.1*   < > 22.8*   < > 29.2* 22.7* 22.3* 21.5* 23.0*  PLT 204  --  167   < > 173 182 166 164 205  MCV 88.9  --  85.7   < > 88.0 87.3 88.8 88.8 88.1  MCH 29.2  --  29.7   < > 29.5 30.0 29.5 29.3 29.5  MCHC 32.8  --  34.6   < > 33.6 34.4 33.2 33.0 33.5  RDW 16.4*  --  16.0*   < > 16.3* 16.1* 16.1* 16.1* 15.9*  LYMPHSABS 0.9  --  0.8  --   --  0.9  --  0.8 0.6*  MONOABS 0.7  --  0.8  --   --  1.1*  --  0.8 0.6  EOSABS 0.0  --  0.1  --   --  0.1  --  0.1 0.1  BASOSABS 0.0  --  0.0  --   --  0.0  --  0.0 0.0   < > = values in this interval not displayed.    Chemistries  Recent Labs  Lab 02/01/2020 0350 02/10/20 0443 02/11/20 0446 02/12/20 0731 02/13/20 0500  NA 134* 134* 135 133* 136  K 3.9 4.5 3.6 3.4* 3.1*  CL 107 110 106 106 107  CO2 19* 18* 20* 16* 19*  GLUCOSE 280* 244* 165* 208* 193*  BUN 42* 33* 33* 32* 31*  CREATININE 2.35* 2.07* 2.02* 2.04* 2.18*  CALCIUM 7.3* 7.2* 7.5* 7.3* 7.4*  MG 1.9 1.8 1.7 1.7 1.8  AST 26 26 20 18 18   ALT 23 23 22 20 19   ALKPHOS 43 40 44 43 60  BILITOT 1.0 0.9 1.1 1.5* 1.3*   ------------------------------------------------------------------------------------------------------------------ No results for input(s): CHOL, HDL, LDLCALC, TRIG, CHOLHDL, LDLDIRECT in the last 72 hours.  Lab Results  Component Value Date   HGBA1C 8.0 (H) 02/07/2020   ------------------------------------------------------------------------------------------------------------------ No results for input(s): TSH, T4TOTAL, T3FREE, THYROIDAB in the last 72 hours.  Invalid input(s):  FREET3 ------------------------------------------------------------------------------------------------------------------ No results for input(s): VITAMINB12, FOLATE, FERRITIN, TIBC, IRON, RETICCTPCT in the last 72 hours.  Coagulation profile Recent Labs  Lab 02/08/20 1200  INR 1.2    Recent Labs    02/11/20 0446  DDIMER 2.41*    Cardiac Enzymes No results for input(s): CKMB, TROPONINI, MYOGLOBIN in the last 168 hours.  Invalid input(s): CK ------------------------------------------------------------------------------------------------------------------    Component Value Date/Time   BNP 66.5 02/12/2020 0731    Micro Results No results found for this or any previous visit (from the past 240 hour(s)).  Radiology Reports CT Head Wo Contrast  Result Date: 01/27/2020 CLINICAL DATA:  Altered mental status EXAM: CT HEAD WITHOUT CONTRAST TECHNIQUE: Contiguous axial images were obtained from the base of the skull through the vertex without intravenous contrast. COMPARISON:  12/23/2008 FINDINGS: Brain: No evidence of acute infarction, hemorrhage, hydrocephalus, extra-axial collection or mass lesion/mass effect. There is a right frontal approach VP shunt with tip terminating in the right lateral ventricle. There is encephalomalacia along the course of the VP shunt which has slightly worsened since the prior study. There is atrophy and chronic microvascular ischemic changes. There is a persistent 1 cm density at the left frontal horn periventricular white matter, stable from prior study. Vascular: No hyperdense vessel or unexpected calcification. Skull: Normal. Negative for fracture or focal lesion. Sinuses/Orbits: There is mucosal thickening of the maxillary sinuses and ethmoid air cells bilaterally. The remaining paranasal sinuses and mastoid air cells are essentially clear. Other: None. IMPRESSION: 1. No acute intracranial abnormality. 2. Chronic microvascular ischemic changes and atrophy  which has progressed since prior study. 3. Well-positioned VP shunt. Electronically Signed   By: Constance Holster M.D.   On: 02/12/2020 19:32  US RENAL  Result Date: 02/12/2020 CLINICAL DATA:  Lower abdominal pain, unspecified EXAM: RENAL / URINARY TRACT ULTRASOUND COMPLETE COMPARISON:  01/30/2020 abdominal CT FINDINGS: Right Kidney: Renal measurements: 8.7 x 3.9 x 4.6 cm = volume: 81 mL . Echogenicity within normal limits. No mass or hydronephrosis visualized. Left Kidney: Renal measurements: 8.9 x 4.7 x 4.8 cm = volume: 104 mL. Echogenicity within normal limits. No mass or hydronephrosis visualized. Bladder: Appears normal for degree of bladder distention. IMPRESSION: No acute finding or asymmetry.  No hydronephrosis. Electronically Signed   By: Monte Fantasia M.D.   On: 02/12/2020 10:25   DG Chest Port 1 View  Result Date: 02/13/2020 CLINICAL DATA:  Shortness of breath.  COVID-19 positive. EXAM: PORTABLE CHEST 1 VIEW COMPARISON:  02/12/2020 FINDINGS: Right-sided ventriculoperitoneal shunt tubing unchanged. Lungs are somewhat hypoinflated demonstrate interval worsening hazy bilateral patchy airspace process likely multifocal pneumonia in this COVID-19 positive patient. No definite effusion. Cardiomediastinal silhouette and remainder of the exam is unchanged. IMPRESSION: Interval worsening bilateral patchy hazy airspace process likely multifocal pneumonia in this COVID-19 positive patient. Electronically Signed   By: Marin Olp M.D.   On: 02/13/2020 08:48   DG Chest Port 1 View  Result Date: 02/12/2020 CLINICAL DATA:  Acute respiratory distress and COVID EXAM: PORTABLE CHEST 1 VIEW COMPARISON:  Three days ago FINDINGS: Bilateral airspace disease with patchy appearance, greater on the left. Cardiomegaly. No visible effusion or pneumothorax. Shunt catheter over the right chest. IMPRESSION: No change in multifocal pneumonia. Electronically Signed   By: Monte Fantasia M.D.   On: 02/12/2020 04:23    DG Chest Port 1 View  Result Date: 01/28/2020 CLINICAL DATA:  Shortness of breath.  Positive COVID test. EXAM: PORTABLE CHEST 1 VIEW COMPARISON:  02/07/2020 FINDINGS: Cardiopericardial silhouette is at upper limits of normal for size. Patchy basilar predominant bilateral airspace opacity, similar to prior. The visualized bony structures of the thorax are intact. Shunt tubing overlies the right hemithorax. IMPRESSION: Similar appearance of basilar predominant patchy airspace disease. Electronically Signed   By: Misty Stanley M.D.   On: 02/04/2020 16:58   DG Chest Port 1 View  Result Date: 02/07/2020 CLINICAL DATA:  Shortness of breath. EXAM: PORTABLE CHEST 1 VIEW COMPARISON:  January 29, 2020 FINDINGS: The mediastinal contour and cardiac silhouette are stable. The heart size is mildly enlarged. Mild patchy opacities are identified in both lung bases, slightly worsened compared prior exam. A VP shunt is identified unchanged. There is no pleural effusion. IMPRESSION: Mild patchy opacities are identified in both lung bases, developing pneumonias are not excluded. Electronically Signed   By: Abelardo Diesel M.D.   On: 02/07/2020 08:36   DG Chest Portable 1 View  Result Date: 02/12/2020 CLINICAL DATA:  Altered mental status EXAM: PORTABLE CHEST 1 VIEW COMPARISON:  09/08/2014 FINDINGS: VP shunt traversing the right chest, continuous where seen. Prominent markings at the bases. No effusion, edema, or pneumothorax. Normal heart size and mediastinal contours. IMPRESSION: Indistinct opacity at the lung bases could be atelectasis (lung volumes are low) or infectious infiltrates. Electronically Signed   By: Monte Fantasia M.D.   On: 02/11/2020 17:47   CT RENAL STONE STUDY  Result Date: 01/30/2020 CLINICAL DATA:  Pyelonephritis. EXAM: CT ABDOMEN AND PELVIS WITHOUT CONTRAST TECHNIQUE: Multidetector CT imaging of the abdomen and pelvis was performed following the standard protocol without IV contrast. COMPARISON:   April 06, 2014. FINDINGS: Lower chest: Multiple airspace opacities are noted in the visualized lung bases concerning for multifocal pneumonia.  Hepatobiliary: No focal liver abnormality is seen. No gallstones, gallbladder wall thickening, or biliary dilatation. Pancreas: Unremarkable. No pancreatic ductal dilatation or surrounding inflammatory changes. Spleen: Normal in size without focal abnormality. Adrenals/Urinary Tract: Adrenal glands are unremarkable. Kidneys are normal, without renal calculi, focal lesion, or hydronephrosis. Bladder is unremarkable. Stomach/Bowel: Stomach is within normal limits. Appendix appears normal. No evidence of bowel wall thickening, distention, or inflammatory changes. Vascular/Lymphatic: Aortic atherosclerosis. No enlarged abdominal or pelvic lymph nodes. Reproductive: Prostate is unremarkable. Other: No abdominal wall hernia or abnormality. No abdominopelvic ascites. There is again noted right-sided ventriculoperitoneal shunt with distal tip in the right lower quadrant. Musculoskeletal: No acute or significant osseous findings. IMPRESSION: 1. Multiple airspace opacities are noted in the visualized lung bases concerning for multifocal pneumonia. 2. Aortic atherosclerosis. 3. No acute abnormality seen in the abdomen or pelvis. Aortic Atherosclerosis (ICD10-I70.0). Electronically Signed   By: Marijo Conception M.D.   On: 01/30/2020 10:48   ECHOCARDIOGRAM LIMITED  Result Date: 01/31/2020   ECHOCARDIOGRAM REPORT   Patient Name:   XAN JASSO Date of Exam: 01/31/2020 Medical Rec #:  [id number]      Height:       70.0 in Accession #:    [id number]     Weight:       159.8 lb Date of Birth:  12/06/1941      BSA:          1.90 m Patient Age:    29 years       BP:           147/68 mmHg Patient Gender: M              HR:           95 bpm. Exam Location:  Inpatient Procedure: Limited Echo Indications:    Fever 780.6/R50.9  History:        Patient has no prior history of Echocardiogram  examinations.                 Risk Factors:Hypertension, Dyslipidemia and Diabetes.  Sonographer:    Clayton Lefort RDCS (AE) Referring Phys: [id number] [place]  1. Left ventricular ejection fraction, by visual estimation, is 60 to 65%. The left ventricle has normal function. There is severely increased left ventricular hypertrophy of the basal septum.  2. Left ventricular diastolic parameters are consistent with Grade I diastolic dysfunction (impaired relaxation).  3. Elevated left ventricular end-diastolic pressure.  4. Global right ventricle has normal systolic function.The right ventricular size is normal. No increase in right ventricular wall thickness.  5. Left atrial size was normal.  6. Right atrial size was normal.  7. Mild to moderate mitral annular calcification.  8. The mitral valve is normal in structure. No evidence of mitral valve regurgitation. No evidence of mitral stenosis.  9. The tricuspid valve is normal in structure. Tricuspid valve regurgitation is not demonstrated. 10. The aortic valve is tricuspid. Aortic valve regurgitation is not visualized. Mild aortic valve sclerosis without stenosis. 11. The pulmonic valve was normal in structure. Pulmonic valve regurgitation is not visualized. 12. The inferior vena cava is normal in size with greater than 50% respiratory variability, suggesting right atrial pressure of 3 mmHg. 13. TR signal is inadequate for assessing pulmonary artery systolic pressure. 14. Poor acoustical windows limit ability to adequately assess for vegetation. No obvious massess on valve. Recommend TEE if clinically indicated. FINDINGS  Left Ventricle: Left ventricular ejection fraction, by visual estimation, is 60  to 65%. The left ventricle has normal function. The left ventricle is not well visualized. There is severely increased left ventricular hypertrophy of the basal septum. Left  ventricular diastolic parameters are consistent with Grade I diastolic dysfunction  (impaired relaxation). Elevated left ventricular end-diastolic pressure. Right Ventricle: The right ventricular size is normal. No increase in right ventricular wall thickness. Global RV systolic function is has normal systolic function. Left Atrium: Left atrial size was normal in size. Right Atrium: Right atrial size was normal in size Pericardium: There is no evidence of pericardial effusion. Mitral Valve: The mitral valve is normal in structure. Mild to moderate mitral annular calcification. No evidence of mitral valve regurgitation. No evidence of mitral valve stenosis by observation. Tricuspid Valve: The tricuspid valve is normal in structure. Tricuspid valve regurgitation is not demonstrated. Aortic Valve: The aortic valve is tricuspid. Aortic valve regurgitation is not visualized. Mild aortic valve sclerosis is present, with no evidence of aortic valve stenosis. Pulmonic Valve: The pulmonic valve was normal in structure. Pulmonic valve regurgitation is not visualized. Pulmonic regurgitation is not visualized. Aorta: The aortic root, ascending aorta and aortic arch are all structurally normal, with no evidence of dilitation or obstruction. Venous: The inferior vena cava is normal in size with greater than 50% respiratory variability, suggesting right atrial pressure of 3 mmHg. IAS/Shunts: No atrial level shunt detected by color flow Doppler. There is no evidence of a patent foramen ovale. No ventricular septal defect is seen or detected. There is no evidence of an atrial septal defect.  LEFT VENTRICLE PLAX 2D LVIDd:         3.70 cm  Diastology LVIDs:         2.30 cm  LV e' lateral:   5.33 cm/s LV PW:         1.30 cm  LV E/e' lateral: 14.5 LV IVS:        1.60 cm  LV e' medial:    6.09 cm/s LVOT diam:     2.00 cm  LV E/e' medial:  12.7 LV SV:         40 ml LV SV Index:   21.12 LVOT Area:     3.14 cm  LEFT ATRIUM         Index LA diam:    2.70 cm 1.42 cm/m   AORTA Ao Root diam: 2.80 cm MITRAL VALVE MV Area  (PHT): 7.99 cm              SHUNTS MV PHT:        27.55 msec            Systemic Diam: 2.00 cm MV Decel Time: 95 msec MV E velocity: 77.10 cm/s  103 cm/s MV A velocity: 118.00 cm/s 70.3 cm/s MV E/A ratio:  0.65        1.5  Fransico Him MD Electronically signed by Fransico Him MD Signature Date/Time: 01/31/2020/11:57:58 AM    Final    DG ESOPHAGUS W SINGLE CM (SOL OR THIN BA)  Result Date: 02/01/2020 CLINICAL DATA:  Mid chest pain and heartburn. Recent bedside speech pathology evaluation. COVID-19 infection. EXAM: SINGLE COLUMN BARIUM ENEMA TECHNIQUE: Initial scout AP supine abdominal image obtained to insure adequate colon cleansing. Barium was introduced into the colon in a retrograde fashion and refluxed from the rectum to the cecum. Spot images of the colon followed by overhead radiographs were obtained. FLUOROSCOPY TIME:  Fluoroscopy Time: 1 minutes and 0 seconds of low-dose pulsed fluoroscopy Radiation Exposure  Index (if provided by the fluoroscopic device): 8.7 mGy Number of Acquired Spot Images: 0 COMPARISON:  Chest radiographs 02/07/2020. Abdominal CT 01/30/2020. FINDINGS: The study was performed in the supine and right lateral decubitus positions. The esophageal motility appears normal. No laryngeal penetration or abnormality of the cervical esophagus was demonstrated. There is mild smooth narrowing of the distal esophageal lumen without mucosal ulceration. A 13 mm barium tablet was administered and was temporarily delayed in the cervical esophagus. This subsequently passed into the distal esophagus. Despite drinking water and thin barium, this tablet did not pass into the stomach during approximately 3 minutes of intermittent fluoroscopic observation with the patient supine and semi erect. IMPRESSION: 1. Mild smooth narrowing of the distal esophageal lumen, likely due to chronic reflux. No evidence of mucosal ulceration. 2. Barium tablet did not pass into the stomach during approximately 3 minutes of  intermittent observation. Electronically Signed   By: Richardean Sale M.D.   On: 02/01/2020 14:25   Signature  Lala Lund M.D on 02/13/2020 at 9:49 AM   -  To page go to [email or web address]

## 2020-02-13 NOTE — Progress Notes (Addendum)
Patient heart rate sustaining in the 120's .  Patient is o2 sats is fluctuation in the 80's on 12 liters HFNC.  Pt is alert x 2 (person and place ) .Pt lungs clear and diminished.  Notified Jeannette Corpus, NP

## 2020-02-13 NOTE — Code Documentation (Deleted)
Responded to Code stroke called at 0431 for L sided weakness, slurred speech, headache, htn, and blurred vision. LSN originally 0230, however, on arrival at 0501, pt stated symptoms started 3 days ago. CBG-69, CT head negative for acute changes, NIH-7 for L sided drift, L facial droop, L sensory deficit, and L side neglect. TPA not given-out of window. CTA not done d/t contrast allergy. Plan for MRI/MRA without contrast.  

## 2020-02-13 NOTE — Evaluation (Signed)
Clinical/Bedside Swallow Evaluation Patient Details  Name: Bob Vazquez MRN: [id number] Date of Birth: Apr 17, 1941  Today's Date: 02/13/2020 Time: SLP Start Time (ACUTE ONLY): [id number] SLP Stop Time (ACUTE ONLY): 1615 SLP Time Calculation (min) (ACUTE ONLY): 10 min  Past Medical History:  Past Medical History:  Diagnosis Date  . Chronic bronchitis ([place])   . Hyperlipemia   . Hypertension   . Migraines    "q now and then"  . NPH (normal pressure hydrocephalus) ([place])   . Prostatic abscess 04/12/14   transurethral resection of prostatic abscess  . Type II diabetes mellitus ([place])    Past Surgical History:  Past Surgical History:  Procedure Laterality Date  . BIOPSY  02/13/2020   Procedure: BIOPSY;  Surgeon: Carol Ada, MD;  Location: [facility] ENDOSCOPY;  Service: Endoscopy;;  . 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 ESOPHAGOGASTRODUODENOSCOPY (EGD) WITH PROPOFOL N/A 01/27/2020   Procedure: ESOPHAGOGASTRODUODENOSCOPY (EGD) WITH PROPOFOL;  Surgeon: Carol Ada, MD;  Location: [place];  Service: Endoscopy;  Laterality: N/A;  . ESOPHAGOGASTRODUODENOSCOPY (EGD) WITH PROPOFOL N/A 02/13/2020   Procedure: ESOPHAGOGASTRODUODENOSCOPY (EGD) WITH PROPOFOL;  Surgeon: Carol Ada, MD;  Location: [place];  Service: Endoscopy;  Laterality: N/A;  . HEMOSTASIS CLIP PLACEMENT  02/11/2020   Procedure: HEMOSTASIS CLIP PLACEMENT;  Surgeon: Carol Ada, MD;  Location: [place];  Service: Endoscopy;;  . HOT HEMOSTASIS N/A 02/16/2020   Procedure: HOT HEMOSTASIS (ARGON PLASMA COAGULATION/BICAP);  Surgeon: Carol Ada, MD;  Location: [place];  Service: Endoscopy;  Laterality: N/A;  . SCLEROTHERAPY  01/30/2020   Procedure: Clide Deutscher;  Surgeon: Carol Ada, MD;  Location: [facility] ENDOSCOPY;  Service: Endoscopy;;  . TONSILLECTOMY  ~ 1980  . TRANSURETHRAL RESECTION OF PROSTATE N/A 04/12/2014   Procedure: TRANSURETHRAL RESECTION OF THE PROSTATE (TURP) Abscess;  Surgeon: Irine Seal, MD;  Location: [facility];  Service: Urology;  Laterality: N/A;  gyrus instruments   HPI:  Bob Vazquez is a 79 year old male with a history of type 2 diabetes, hypertension, hyperlipidemia, NPH who lives alone and family had not heard from him in several days. Admitted 2/5 with UTI, likely sepsis and DKA and also Covid-19 positive. Bedside swallow eval completed 2/7 with recs for esophageal workup. Barium esophagram on 2/8 showed some mild smooth narrowing in the distal esophagus.  EGD completed on 2/12-showed severe esophagitis and gastric ulcer with visible vessel/and a nonbleeding duodenal ulcer with an adherent clot.  Pt had ongoing severe odynophagia to both solids and liquids. Repeat EGD 2/16 revealed "patient's esophagitis is improving. Previously there was inflammation noted in the entire esophagus and now it is limited to the distal esophagus. The prepyloric ulcer was noted to be healing."   Was improving in that regard and D/Cd from SLP service on 2/15.  New orders received 2/20 for repeat swallow eval.  Pt with worsening respiratory status; was NPO and was diuresed, but hypoxia and SOB are worsening and CRP has started to increase per MD notes on 2/20. RN reported increased coughing with POs on evening of 2/19, but no further difficulty noted today.    Assessment / Plan / Recommendation Clinical Impression  Pt presents much differently than when last seen by SLP on 2/15, at which time he was D/Cd from our service due to improving ability to eat/drink and less pain, good airway protection.  Now he is on 15L NRB with increasing 02 needs the last 24 hours, SOB, speech is difficult to understand as he can only produce 2-3 words per  each exhalation.  He took limited sips of thin liquid with no overt s/s of aspiration, but declined further POs and his WOB was visibly high.  Sats remained low 90s.  Swallow mechanics appear to be functional, but will be highly related to tachypnea and WOB.  Recommend allowing pt to eat/drink per  his preferences.  He should be seated as upright as possible and assisted with PO intake.  Hold tray if WOB impedes safe eating.  Our service will sign off.  SLP Visit Diagnosis: Dysphagia, unspecified (R13.10)    Aspiration Risk       Diet Recommendation   continue current diet   Medication Administration: Crushed with puree    Other  Recommendations Oral Care Recommendations: Oral care BID   Follow up Recommendations None      Frequency and Duration            Prognosis        Swallow Study   General Date of Onset: 02/06/2020 HPI: Bob Vazquez is a 79 year old male with a history of type 2 diabetes, hypertension, hyperlipidemia, NPH who lives alone and family had not heard from him in several days. Admitted 2/5 with UTI, likely sepsis and DKA and also Covid-19 positive. Bedside swallow eval completed 2/7 with recs for esophageal workup. Barium esophagram on 2/8 showed some mild smooth narrowing in the distal esophagus.  EGD completed on 2/12-showed severe esophagitis and gastric ulcer with visible vessel/and a nonbleeding duodenal ulcer with an adherent clot.  Pt had ongoing severe odynophagia to both solids and liquids. Repeat EGD 2/16 revealed "patient's esophagitis is improving. Previously there was inflammation noted in the entire esophagus and now it is limited to the distal esophagus. The prepyloric ulcer was noted to be healing."   Was improving in that regard and D/Cd from SLP service on 2/15.  New orders received 2/20 for repeat swallow eval.  Pt with worsening respiratory status; was NPO and was diuresed, but hypoxia and SOB are worsening and CRP has started to increase per MD notes on 2/20. RN reported increased coughing with POs on evening of 2/19, but no further difficulty noted today.  Type of Study: Bedside Swallow Evaluation Previous Swallow Assessment: ssee HPI Diet Prior to this Study: Dysphagia 3 (soft);Thin liquids Temperature Spikes Noted: No Respiratory Status:  Non-rebreather(15L) History of Recent Intubation: No Behavior/Cognition: Confused;Lethargic/Drowsy Oral Cavity Assessment: Within Functional Limits Oral Care Completed by SLP: No Oral Cavity - Dentition: Poor condition;Missing dentition Self-Feeding Abilities: Needs assist Patient Positioning: Upright in bed Baseline Vocal Quality: Breathy Volitional Cough: Weak Volitional Swallow: Able to elicit    Oral/Motor/Sensory Function Overall Oral Motor/Sensory Function: Within functional limits   Ice Chips Ice chips: Not tested   Thin Liquid Thin Liquid: Within functional limits    Nectar Thick Nectar Thick Liquid: Not tested   Honey Thick Honey Thick Liquid: Not tested   Puree Puree: Not tested   Solid     Solid: Not tested      [name] 02/13/2020,4:31 PM   Estill Bamberg L. Tivis Ringer, [place] Office number [phone] Pager [phone]

## 2020-02-14 LAB — COMPREHENSIVE METABOLIC PANEL
ALT: 22 U/L (ref 0–44)
AST: 24 U/L (ref 15–41)
Albumin: 1.4 g/dL — ABNORMAL LOW (ref 3.5–5.0)
Alkaline Phosphatase: 88 U/L (ref 38–126)
Anion gap: 12 (ref 5–15)
BUN: 53 mg/dL — ABNORMAL HIGH (ref 8–23)
CO2: 18 mmol/L — ABNORMAL LOW (ref 22–32)
Calcium: 7.8 mg/dL — ABNORMAL LOW (ref 8.9–10.3)
Chloride: 109 mmol/L (ref 98–111)
Creatinine, Ser: 3.73 mg/dL — ABNORMAL HIGH (ref 0.61–1.24)
GFR calc Af Amer: 17 mL/min — ABNORMAL LOW (ref >60)
GFR calc non Af Amer: 14 mL/min — ABNORMAL LOW (ref >60)
Glucose, Bld: 306 mg/dL — ABNORMAL HIGH (ref 70–99)
Potassium: 4.8 mmol/L (ref 3.5–5.1)
Sodium: 139 mmol/L (ref 135–145)
Total Bilirubin: 1.8 mg/dL — ABNORMAL HIGH (ref 0.3–1.2)
Total Protein: 5.7 g/dL — ABNORMAL LOW (ref 6.5–8.1)

## 2020-02-14 LAB — CBC WITH DIFFERENTIAL/PLATELET
Abs Immature Granulocytes: 0 10*3/uL (ref 0.00–0.07)
Basophils Absolute: 0 10*3/uL (ref 0.0–0.1)
Basophils Relative: 0 %
Eosinophils Absolute: 0 10*3/uL (ref 0.0–0.5)
Eosinophils Relative: 0 %
HCT: 24.5 % — ABNORMAL LOW (ref 39.0–52.0)
Hemoglobin: 8 g/dL — ABNORMAL LOW (ref 13.0–17.0)
Lymphocytes Relative: 2 %
Lymphs Abs: 0.2 10*3/uL — ABNORMAL LOW (ref 0.7–4.0)
MCH: 29.2 pg (ref 26.0–34.0)
MCHC: 32.7 g/dL (ref 30.0–36.0)
MCV: 89.4 fL (ref 80.0–100.0)
Monocytes Absolute: 0.1 10*3/uL (ref 0.1–1.0)
Monocytes Relative: 1 %
Neutro Abs: 9.7 10*3/uL — ABNORMAL HIGH (ref 1.7–7.7)
Neutrophils Relative %: 97 %
Platelets: 212 10*3/uL (ref 150–400)
RBC: 2.74 MIL/uL — ABNORMAL LOW (ref 4.22–5.81)
RDW: 16.6 % — ABNORMAL HIGH (ref 11.5–15.5)
WBC: 10 10*3/uL (ref 4.0–10.5)
nRBC: 0 % (ref 0.0–0.2)
nRBC: 0 /100 WBC

## 2020-02-14 LAB — MAGNESIUM: Magnesium: 2 mg/dL (ref 1.7–2.4)

## 2020-02-14 LAB — BRAIN NATRIURETIC PEPTIDE: B Natriuretic Peptide: 89.2 pg/mL (ref 0.0–100.0)

## 2020-02-14 LAB — PROCALCITONIN: Procalcitonin: 40.93 ng/mL

## 2020-02-14 LAB — C-REACTIVE PROTEIN: CRP: 40.4 mg/dL — ABNORMAL HIGH (ref <1.0)

## 2020-02-14 NOTE — Progress Notes (Signed)
Patient is currently on a morphine drip.  Paged  Jeannette Corpus, NP  to determined whether the current orders should be carried out. Jeannette Corpus,  NP ordered to follow comfort measures only.  Will follow the order and continue to monitor the patient

## 2020-02-14 NOTE — Progress Notes (Signed)
Niece and brother updated about patients condition. Patient comfortable on non-rebreather,sats are low 80's. Patient brother is planning to visit for 15 mins per policy.

## 2020-02-14 NOTE — Progress Notes (Signed)
Family at bedside to visit, all question and concerns were addressed.

## 2020-02-14 NOTE — Progress Notes (Signed)
Attempted to wean patient to nasal cannula and patient didn't tolerate. Sats dropped and respiration dropped.

## 2020-02-14 NOTE — Progress Notes (Signed)
PROGRESS NOTE                                                                                                                                                                                                             Patient Demographics:    Bob Vazquez, is a 79 y.o. male, DOB - [birth year], [id number]  Outpatient Primary MD for the patient is Nolene Ebbs, MD   Admit date - 01/28/2020   LOS - 59  Chief Complaint  Patient presents with  . Altered Mental Status  . Hyperglycemia       Brief Narrative: Patient is a 79 y.o. male with PMHx of DM-2, HTN, HLD, NPH-who was found very confused in an empty bathtub on a wellness check by law enforcement (family had not heard from him for several days)-he was subsequently brought to the ED-and was found to have acute metabolic encephalopathy in the setting of severe sepsis due to UTI, DKA and COVID-19 pneumonia.  He was admitted to the [facility] stability transferred to the [facility] on 2/9.  Further hospital course has been complicated by odynophagia due to severe erosive esophagitis-acute blood loss anemia likely due to upper GI bleeding.  See below for further details.  Patient initially showed improvement however around 02/12/2020 he started getting more short of breath, initially it was thought that he was aspirating or he was in CHF, he was made n.p.o. and given Lasix but he did not improve.  Subsequently his chest x-ray started to show worsening interstitial infiltrates and CRP started to rise on 02/13/2020 suggesting he might have a second surge of Covid pneumonia.  He had already completed his remdesivir treatment and was not a Actemra candidate due to GI bleed this admission, he was started back on IV steroids.  Unfortunately he continued to decline and as per his wishes he will be transition to full comfort care with  morphine drip at 4:55 PM on 02/13/2020.  I expect him to pass away within the next day or so. Family informed in detail evening of 02/13/2020.    Subjective:   Patient in bed appears short of breath, says  he has no chest or abdominal pain but definitely has shortness of breath and problems laying flat.   Assessment  & Plan :      Patient is now full comfort measures, on IV morphine drip, I expect to pass away soon.  Major medical issues addressed this admission are below.  Currently on only comfort medications.  All other noncomfort medications and lab draws will be stopped.      Odynophagia: Continues to have severe odynophagia to both solids and liquids-in spite of being on Diflucan, PPI and Carafate.  Barium esophagogram on 2/8 showed some mild smooth narrowing in the distal esophagus.  EGD completed on 2/12-showed severe esophagitis and gastric ulcer with visible vessel/and a nonbleeding duodenal ulcer with an adherent clot.  No evidence of Candida, Magic mouthwash with lidocaine added premeals with some improvement in his odynophagia, GI and speech following.  Upper GI bleeding with acute blood loss anemia: Hemoglobin has been trending down-initially thought to be due to acute illness-however due to EGD findings-high suspicion that this patient is likely having a upper GI bleeding.  Did drop H&H with evidence of acute anemia due to upper GI blood loss requiring 2 units of packed RBC on 02/06/2020, IV PPI being continued, his H&H had stabilized unfortunately on 02/14/2020 it has dropped again and I think he is bleeding again, will repeat 1 unit of packed RBC on 02/19/2020, n.p.o., have discussed with GI repeat EGD soon.  Acute on chronic diastolic dysfunction.  EF 60% on recent echocardiogram.  Aggressive diuresis with Lasix, stop all IV fluids.  Oxygen supplementation, Nitropaste x1.  Monitor closely.  Could not tolerate beta-blocker earlier due to hypotension.  Current blood pressure is slightly  high due to respiratory distress.  Hypokalemia.  Replaced.  Covid 19 Viral pneumonia: Stable on room air-has completed a course of remdesivir-this diagnosis is over 60 weeks old but around 02/12/2020 he is again starting to get short of breath, we initially thought he had aspiration or CHF.  But despite diuresis and n.p.o. status his hypoxia and shortness of breath are worsening and CRP has gradually started to increase.  I am wondering if he has a late rebound COVID-19 pneumonia resurgence, not a Actemra candidate due to recent GI bleed and risk of perforation, will place him on steroids as he is already finished remdesivir and monitor closely.  Condition will be critical.   O2 requirements:  SpO2: 99 % O2 Flow Rate (L/min): Room air  COVID-19 Labs: Recent Labs    02/12/20 0731 02/13/20 0500 02/01/2020 0500  CRP 23.0* 33.5* 40.4*       Component Value Date/Time   BNP 89.2 01/27/2020 0500    Recent Labs  Lab 02/12/20 0732 02/13/20 0500 02/14/2020 0500  PROCALCITON 3.27 7.91 40.93    Lab Results  Component Value Date   SARSCOV2NAA POSITIVE (A) 01/28/2020   [place] Not Detected 07/13/2019     COVID-19 Medications: Steroids: 2/5>> Remdesivir: 2/5>> 2/9  Severe sepsis secondary to UTI: Sepsis physiology has resolved-cultures positive for Klebsiella pneumonia and Streptococcus agalactiae-blood cultures negative.  Has completed a course of antimicrobial therapy.  Antibiotics: Ceftriaxone: 2/9>>2/11 Cefepime: 2/5>> 2/8  Acute metabolic encephalopathy: Secondary to DKA/AKI-improved-suspect patient not far from usual baseline.  AKI on CKD stage IIIb: AKI likely hemodynamically mediated-creatinine continues to slightly increase-likely secondary to poor oral intake due to odynophagia-an upper GI bleeding.  Bladder scans without significant urinary retention-follow for now.  HTN: Blood pressure slightly high due to respiratory distress, low-dose  Nitropaste, previously could  not tolerate beta-blockers.  HLD: Statins on hold-resume in the next few days.  Orthostatic Hypotension: probably secondary to dehydration/Diabetic neuropathy-retrospectively-may have been developing a GI bleeding that could have contributed to orthostatic hypotension.  All antihypertensives on hold-gentle IV fluid hydration in progress.   Deconditioning/debility: Secondary to acute illness-PT/OT following-recommendations of SNF-social work consulted.  Insulin-dependent DM-2: Was in DKA initially which has resolved, restarting insulin drip on 02/13/2020 as he is being replaced on steroids due to pulmonary issues.  Lab Results  Component Value Date   HGBA1C 8.0 (H) 02/07/2020   CBG (last 3)  Recent Labs    02/13/20 1137 02/13/20 1441 02/13/20 1450  GLUCAP 400* 456* 447*     Consults  :  PCCM,GI  Procedures  :    EGD -showing severe esophagitis and ulcer  Barium swallow -assistant with esophageal stricture.  Echocardiogram    1. Left ventricular ejection fraction, by visual estimation, is 60 to 65%. The left ventricle has normal function. There is severely increased  left ventricular hypertrophy of the basal septum.  2. Left ventricular diastolic parameters are consistent with Grade I diastolic dysfunction (impaired relaxation).  3. Elevated left ventricular end-diastolic pressure.  4. Global right ventricle has normal systolic function.The right ventricular size is normal. No increase in right ventricular wall thickness.  5. Left atrial size was normal.  6. Right atrial size was normal.  7. Mild to moderate mitral annular calcification.  8. The mitral valve is normal in structure. No evidence of mitral valve regurgitation. No evidence of mitral stenosis.  9. The tricuspid valve is normal in structure. Tricuspid valve regurgitation is not demonstrated.  10. The aortic valve is tricuspid. Aortic valve regurgitation is not visualized. Mild aortic valve sclerosis  without stenosis.  11. The pulmonic valve was normal in structure. Pulmonic valve regurgitation is not visualized.  12. The inferior vena cava is normal in size with greater than 50% respiratory variability, suggesting right atrial pressure of 3 mmHg.  13. TR signal is inadequate for assessing pulmonary artery systolic pressure.  14. Poor acoustical windows limit ability to adequately assess for vegetation. No obvious massess on valve. Recommend TEE if clinically indicated.   Condition -  extremely guarded  Family Communication  : Updated patient's niece on 02/08/2020, they are aware of patient's wishes of being DNR and hospice if he declines.   Called x 2 on  02/13/20- no response but mailbox is full. Finally updated her at 4.56pm  Called brother at 4:55 PM on 02/13/2020   Code Status : DNR  Diet :  Diet Order            DIET SOFT Room service appropriate? Yes; Fluid consistency: Thin  Diet effective now               Disposition Plan  : SNF once improved from respiratory distress.  Antimicorbials  :    Anti-infectives (From admission, onward)   Start     Dose/Rate Route Frequency Ordered Stop   02/02/20 1400  fluconazole (DIFLUCAN) IVPB 100 mg  Status:  Discontinued     100 mg 50 mL/hr over 60 Minutes Intravenous Every 24 hours 02/02/20 1341 02/06/20 1148   02/02/20 1330  cefTRIAXone (ROCEPHIN) 1 g in sodium chloride 0.9 % 100 mL IVPB     1 g 200 mL/hr over 30 Minutes Intravenous Every 24 hours 02/02/20 1328 02/04/20 1234   01/30/20 1830  ceFEPIme (MAXIPIME) 2 g in sodium chloride 0.9 %  100 mL IVPB  Status:  Discontinued     2 g 200 mL/hr over 30 Minutes Intravenous Every 24 hours 02/11/2020 1927 02/02/20 1328   01/30/20 1000  remdesivir 100 mg in sodium chloride 0.9 % 100 mL IVPB     100 mg 200 mL/hr over 30 Minutes Intravenous Daily 02/03/2020 2258 02/02/20 1000   01/31/2020 2300  remdesivir 200 mg in sodium chloride 0.9% 250 mL IVPB     200 mg 580 mL/hr over 30 Minutes  Intravenous Once 02/01/2020 2258 01/30/20 0102   02/04/2020 1929  vancomycin variable dose per unstable renal function (pharmacist dosing)  Status:  Discontinued      Does not apply See admin instructions 01/31/2020 1929 01/30/20 1429   01/30/2020 1730  ceFEPIme (MAXIPIME) 2 g in sodium chloride 0.9 % 100 mL IVPB     2 g 200 mL/hr over 30 Minutes Intravenous  Once 02/16/2020 1716 01/27/2020 1935   02/07/2020 1730  metroNIDAZOLE (FLAGYL) IVPB 500 mg     500 mg 100 mL/hr over 60 Minutes Intravenous  Once 02/04/2020 1716 01/26/2020 2000   02/10/2020 1730  vancomycin (VANCOCIN) IVPB 1000 mg/200 mL premix     1,000 mg 200 mL/hr over 60 Minutes Intravenous  Once 01/26/2020 1716 02/02/2020 2005     DVT prophylaxis.  SCDs added.   Inpatient Medications  Scheduled Meds: . feeding supplement  1 Container Oral 4x daily  . feeding supplement (PRO-STAT SUGAR FREE 64)  60 mL Oral BID  . magic mouthwash w/lidocaine  10 mL Oral TID AC   Continuous Infusions: . morphine 5 mg/hr (02/13/20 1724)   PRN Meds:.   Time Spent in minutes  25  See all Orders from today for further details   Lala Lund M.D on 02/04/2020 at 9:00 AM  To page go to [email or web address] - use universal password  [facility] -  Office  [phone]    Objective:   Vitals:   02/13/20 1815 02/13/20 2124 01/28/2020 0400 02/13/2020 0630  BP: (!) 118/44  (!) 104/55 (!) 115/54  Pulse: (!) 109  (!) 112 (!) 112  Resp: (!) 37  14 11  Temp:  (!) 96.4 F (35.8 C) 97.8 F (36.6 C)   TempSrc:  Axillary Axillary   SpO2: 95%  100% 100%  Weight:   70.3 kg   Height:        Wt Readings from Last 3 Encounters:  01/28/2020 70.3 kg  04/12/14 81.6 kg  11/12/13 81.6 kg     Intake/Output Summary (Last 24 hours) at 01/27/2020 0900 Last data filed at 02/13/2020 2127 Gross per 24 hour  Intake 327.95 ml  Output 2175 ml  Net -1847.05 ml     Physical Exam  Patient in bed on nonrebreather mask, having agonal breathing but in no distress on morphine  drip.    Data Review:    CBC Recent Labs  Lab 02/10/20 0443 02/10/20 1336 02/11/20 0446 02/11/20 0759 02/12/20 0731 02/13/20 0500 02/14/20 0500  WBC 11.7*   < > 12.9* 12.6* 11.1* 10.7* 10.0  HGB 7.9*   < > 7.8* 7.4* 7.1* 7.7* 8.0*  HCT 22.8*   < > 22.7* 22.3* 21.5* 23.0* 24.5*  PLT 167   < > 182 166 164 205 212  MCV 85.7   < > 87.3 88.8 88.8 88.1 89.4  MCH 29.7   < > 30.0 29.5 29.3 29.5 29.2  MCHC 34.6   < > 34.4 33.2 33.0 33.5 32.7  RDW 16.0*   < >  16.1* 16.1* 16.1* 15.9* 16.6*  LYMPHSABS 0.8  --  0.9  --  0.8 0.6* 0.2*  MONOABS 0.8  --  1.1*  --  0.8 0.6 0.1  EOSABS 0.1  --  0.1  --  0.1 0.1 0.0  BASOSABS 0.0  --  0.0  --  0.0 0.0 0.0   < > = values in this interval not displayed.    Chemistries  Recent Labs  Lab 02/10/20 0443 02/11/20 0446 02/12/20 0731 02/13/20 0500 02/19/2020 0500  NA 134* 135 133* 136 139  K 4.5 3.6 3.4* 3.1* 4.8  CL 110 106 106 107 109  CO2 18* 20* 16* 19* 18*  GLUCOSE 244* 165* 208* 193* 306*  BUN 33* 33* 32* 31* 53*  CREATININE 2.07* 2.02* 2.04* 2.18* 3.73*  CALCIUM 7.2* 7.5* 7.3* 7.4* 7.8*  MG 1.8 1.7 1.7 1.8 2.0  AST 26 20 18 18 24   ALT 23 22 20 19 22   ALKPHOS 40 44 43 60 88  BILITOT 0.9 1.1 1.5* 1.3* 1.8*   ------------------------------------------------------------------------------------------------------------------ No results for input(s): CHOL, HDL, LDLCALC, TRIG, CHOLHDL, LDLDIRECT in the last 72 hours.  Lab Results  Component Value Date   HGBA1C 8.0 (H) 02/07/2020   ------------------------------------------------------------------------------------------------------------------ No results for input(s): TSH, T4TOTAL, T3FREE, THYROIDAB in the last 72 hours.  Invalid input(s): FREET3 ------------------------------------------------------------------------------------------------------------------ No results for input(s): VITAMINB12, FOLATE, FERRITIN, TIBC, IRON, RETICCTPCT in the last 72 hours.  Coagulation  profile Recent Labs  Lab 02/08/20 1200  INR 1.2    No results for input(s): DDIMER in the last 72 hours.  Cardiac Enzymes No results for input(s): CKMB, TROPONINI, MYOGLOBIN in the last 168 hours.  Invalid input(s): CK ------------------------------------------------------------------------------------------------------------------    Component Value Date/Time   BNP 89.2 02/10/2020 0500    Micro Results No results found for this or any previous visit (from the past 240 hour(s)).  Radiology Reports CT Head Wo Contrast  Result Date: 01/31/2020 CLINICAL DATA:  Altered mental status EXAM: CT HEAD WITHOUT CONTRAST TECHNIQUE: Contiguous axial images were obtained from the base of the skull through the vertex without intravenous contrast. COMPARISON:  12/23/2008 FINDINGS: Brain: No evidence of acute infarction, hemorrhage, hydrocephalus, extra-axial collection or mass lesion/mass effect. There is a right frontal approach VP shunt with tip terminating in the right lateral ventricle. There is encephalomalacia along the course of the VP shunt which has slightly worsened since the prior study. There is atrophy and chronic microvascular ischemic changes. There is a persistent 1 cm density at the left frontal horn periventricular white matter, stable from prior study. Vascular: No hyperdense vessel or unexpected calcification. Skull: Normal. Negative for fracture or focal lesion. Sinuses/Orbits: There is mucosal thickening of the maxillary sinuses and ethmoid air cells bilaterally. The remaining paranasal sinuses and mastoid air cells are essentially clear. Other: None. IMPRESSION: 1. No acute intracranial abnormality. 2. Chronic microvascular ischemic changes and atrophy which has progressed since prior study. 3. Well-positioned VP shunt. Electronically Signed   By: Constance Holster M.D.   On: 02/13/2020 19:32   US RENAL  Result Date: 02/12/2020 CLINICAL DATA:  Lower abdominal pain, unspecified  EXAM: RENAL / URINARY TRACT ULTRASOUND COMPLETE COMPARISON:  01/30/2020 abdominal CT FINDINGS: Right Kidney: Renal measurements: 8.7 x 3.9 x 4.6 cm = volume: 81 mL . Echogenicity within normal limits. No mass or hydronephrosis visualized. Left Kidney: Renal measurements: 8.9 x 4.7 x 4.8 cm = volume: 104 mL. Echogenicity within normal limits. No mass or hydronephrosis visualized. Bladder: Appears normal for  degree of bladder distention. IMPRESSION: No acute finding or asymmetry.  No hydronephrosis. Electronically Signed   By: Monte Fantasia M.D.   On: 02/12/2020 10:25   DG Chest Port 1 View  Result Date: 02/13/2020 CLINICAL DATA:  Shortness of breath.  COVID-19 positive. EXAM: PORTABLE CHEST 1 VIEW COMPARISON:  02/12/2020 FINDINGS: Right-sided ventriculoperitoneal shunt tubing unchanged. Lungs are somewhat hypoinflated demonstrate interval worsening hazy bilateral patchy airspace process likely multifocal pneumonia in this COVID-19 positive patient. No definite effusion. Cardiomediastinal silhouette and remainder of the exam is unchanged. IMPRESSION: Interval worsening bilateral patchy hazy airspace process likely multifocal pneumonia in this COVID-19 positive patient. Electronically Signed   By: Marin Olp M.D.   On: 02/13/2020 08:48   DG Chest Port 1 View  Result Date: 02/12/2020 CLINICAL DATA:  Acute respiratory distress and COVID EXAM: PORTABLE CHEST 1 VIEW COMPARISON:  Three days ago FINDINGS: Bilateral airspace disease with patchy appearance, greater on the left. Cardiomegaly. No visible effusion or pneumothorax. Shunt catheter over the right chest. IMPRESSION: No change in multifocal pneumonia. Electronically Signed   By: Monte Fantasia M.D.   On: 02/12/2020 04:23   DG Chest Port 1 View  Result Date: 02/02/2020 CLINICAL DATA:  Shortness of breath.  Positive COVID test. EXAM: PORTABLE CHEST 1 VIEW COMPARISON:  02/07/2020 FINDINGS: Cardiopericardial silhouette is at upper limits of normal for  size. Patchy basilar predominant bilateral airspace opacity, similar to prior. The visualized bony structures of the thorax are intact. Shunt tubing overlies the right hemithorax. IMPRESSION: Similar appearance of basilar predominant patchy airspace disease. Electronically Signed   By: Misty Stanley M.D.   On: 02/02/2020 16:58   DG Chest Port 1 View  Result Date: 02/07/2020 CLINICAL DATA:  Shortness of breath. EXAM: PORTABLE CHEST 1 VIEW COMPARISON:  January 29, 2020 FINDINGS: The mediastinal contour and cardiac silhouette are stable. The heart size is mildly enlarged. Mild patchy opacities are identified in both lung bases, slightly worsened compared prior exam. A VP shunt is identified unchanged. There is no pleural effusion. IMPRESSION: Mild patchy opacities are identified in both lung bases, developing pneumonias are not excluded. Electronically Signed   By: Abelardo Diesel M.D.   On: 02/07/2020 08:36   DG Chest Portable 1 View  Result Date: 02/10/2020 CLINICAL DATA:  Altered mental status EXAM: PORTABLE CHEST 1 VIEW COMPARISON:  09/08/2014 FINDINGS: VP shunt traversing the right chest, continuous where seen. Prominent markings at the bases. No effusion, edema, or pneumothorax. Normal heart size and mediastinal contours. IMPRESSION: Indistinct opacity at the lung bases could be atelectasis (lung volumes are low) or infectious infiltrates. Electronically Signed   By: Monte Fantasia M.D.   On: 02/12/2020 17:47   CT RENAL STONE STUDY  Result Date: 01/30/2020 CLINICAL DATA:  Pyelonephritis. EXAM: CT ABDOMEN AND PELVIS WITHOUT CONTRAST TECHNIQUE: Multidetector CT imaging of the abdomen and pelvis was performed following the standard protocol without IV contrast. COMPARISON:  April 06, 2014. FINDINGS: Lower chest: Multiple airspace opacities are noted in the visualized lung bases concerning for multifocal pneumonia. Hepatobiliary: No focal liver abnormality is seen. No gallstones, gallbladder wall thickening,  or biliary dilatation. Pancreas: Unremarkable. No pancreatic ductal dilatation or surrounding inflammatory changes. Spleen: Normal in size without focal abnormality. Adrenals/Urinary Tract: Adrenal glands are unremarkable. Kidneys are normal, without renal calculi, focal lesion, or hydronephrosis. Bladder is unremarkable. Stomach/Bowel: Stomach is within normal limits. Appendix appears normal. No evidence of bowel wall thickening, distention, or inflammatory changes. Vascular/Lymphatic: Aortic atherosclerosis. No enlarged abdominal or  pelvic lymph nodes. Reproductive: Prostate is unremarkable. Other: No abdominal wall hernia or abnormality. No abdominopelvic ascites. There is again noted right-sided ventriculoperitoneal shunt with distal tip in the right lower quadrant. Musculoskeletal: No acute or significant osseous findings. IMPRESSION: 1. Multiple airspace opacities are noted in the visualized lung bases concerning for multifocal pneumonia. 2. Aortic atherosclerosis. 3. No acute abnormality seen in the abdomen or pelvis. Aortic Atherosclerosis (ICD10-I70.0). Electronically Signed   By: Marijo Conception M.D.   On: 01/30/2020 10:48   ECHOCARDIOGRAM LIMITED  Result Date: 01/31/2020   ECHOCARDIOGRAM REPORT   Patient Name:   Bob Vazquez Date of Exam: 01/31/2020 Medical Rec #:  [id number]      Height:       70.0 in Accession #:    [id number]     Weight:       159.8 lb Date of Birth:  1941/05/26      BSA:          1.90 m Patient Age:    20 years       BP:           147/68 mmHg Patient Gender: M              HR:           95 bpm. Exam Location:  Inpatient Procedure: Limited Echo Indications:    Fever 780.6/R50.9  History:        Patient has no prior history of Echocardiogram examinations.                 Risk Factors:Hypertension, Dyslipidemia and Diabetes.  Sonographer:    Clayton Lefort RDCS (AE) Referring Phys: [id number] [place]  1. Left ventricular ejection fraction, by visual estimation, is 60 to  65%. The left ventricle has normal function. There is severely increased left ventricular hypertrophy of the basal septum.  2. Left ventricular diastolic parameters are consistent with Grade I diastolic dysfunction (impaired relaxation).  3. Elevated left ventricular end-diastolic pressure.  4. Global right ventricle has normal systolic function.The right ventricular size is normal. No increase in right ventricular wall thickness.  5. Left atrial size was normal.  6. Right atrial size was normal.  7. Mild to moderate mitral annular calcification.  8. The mitral valve is normal in structure. No evidence of mitral valve regurgitation. No evidence of mitral stenosis.  9. The tricuspid valve is normal in structure. Tricuspid valve regurgitation is not demonstrated. 10. The aortic valve is tricuspid. Aortic valve regurgitation is not visualized. Mild aortic valve sclerosis without stenosis. 11. The pulmonic valve was normal in structure. Pulmonic valve regurgitation is not visualized. 12. The inferior vena cava is normal in size with greater than 50% respiratory variability, suggesting right atrial pressure of 3 mmHg. 13. TR signal is inadequate for assessing pulmonary artery systolic pressure. 14. Poor acoustical windows limit ability to adequately assess for vegetation. No obvious massess on valve. Recommend TEE if clinically indicated. FINDINGS  Left Ventricle: Left ventricular ejection fraction, by visual estimation, is 60 to 65%. The left ventricle has normal function. The left ventricle is not well visualized. There is severely increased left ventricular hypertrophy of the basal septum. Left  ventricular diastolic parameters are consistent with Grade I diastolic dysfunction (impaired relaxation). Elevated left ventricular end-diastolic pressure. Right Ventricle: The right ventricular size is normal. No increase in right ventricular wall thickness. Global RV systolic function is has normal systolic function. Left  Atrium: Left atrial size was normal  in size. Right Atrium: Right atrial size was normal in size Pericardium: There is no evidence of pericardial effusion. Mitral Valve: The mitral valve is normal in structure. Mild to moderate mitral annular calcification. No evidence of mitral valve regurgitation. No evidence of mitral valve stenosis by observation. Tricuspid Valve: The tricuspid valve is normal in structure. Tricuspid valve regurgitation is not demonstrated. Aortic Valve: The aortic valve is tricuspid. Aortic valve regurgitation is not visualized. Mild aortic valve sclerosis is present, with no evidence of aortic valve stenosis. Pulmonic Valve: The pulmonic valve was normal in structure. Pulmonic valve regurgitation is not visualized. Pulmonic regurgitation is not visualized. Aorta: The aortic root, ascending aorta and aortic arch are all structurally normal, with no evidence of dilitation or obstruction. Venous: The inferior vena cava is normal in size with greater than 50% respiratory variability, suggesting right atrial pressure of 3 mmHg. IAS/Shunts: No atrial level shunt detected by color flow Doppler. There is no evidence of a patent foramen ovale. No ventricular septal defect is seen or detected. There is no evidence of an atrial septal defect.  LEFT VENTRICLE PLAX 2D LVIDd:         3.70 cm  Diastology LVIDs:         2.30 cm  LV e' lateral:   5.33 cm/s LV PW:         1.30 cm  LV E/e' lateral: 14.5 LV IVS:        1.60 cm  LV e' medial:    6.09 cm/s LVOT diam:     2.00 cm  LV E/e' medial:  12.7 LV SV:         40 ml LV SV Index:   21.12 LVOT Area:     3.14 cm  LEFT ATRIUM         Index LA diam:    2.70 cm 1.42 cm/m   AORTA Ao Root diam: 2.80 cm MITRAL VALVE MV Area (PHT): 7.99 cm              SHUNTS MV PHT:        27.55 msec            Systemic Diam: 2.00 cm MV Decel Time: 95 msec MV E velocity: 77.10 cm/s  103 cm/s MV A velocity: 118.00 cm/s 70.3 cm/s MV E/A ratio:  0.65        1.5  Fransico Him MD  Electronically signed by Fransico Him MD Signature Date/Time: 01/31/2020/11:57:58 AM    Final    DG ESOPHAGUS W SINGLE CM (SOL OR THIN BA)  Result Date: 02/01/2020 CLINICAL DATA:  Mid chest pain and heartburn. Recent bedside speech pathology evaluation. COVID-19 infection. EXAM: SINGLE COLUMN BARIUM ENEMA TECHNIQUE: Initial scout AP supine abdominal image obtained to insure adequate colon cleansing. Barium was introduced into the colon in a retrograde fashion and refluxed from the rectum to the cecum. Spot images of the colon followed by overhead radiographs were obtained. FLUOROSCOPY TIME:  Fluoroscopy Time: 1 minutes and 0 seconds of low-dose pulsed fluoroscopy Radiation Exposure Index (if provided by the fluoroscopic device): 8.7 mGy Number of Acquired Spot Images: 0 COMPARISON:  Chest radiographs 02/13/2020. Abdominal CT 01/30/2020. FINDINGS: The study was performed in the supine and right lateral decubitus positions. The esophageal motility appears normal. No laryngeal penetration or abnormality of the cervical esophagus was demonstrated. There is mild smooth narrowing of the distal esophageal lumen without mucosal ulceration. A 13 mm barium tablet was administered and was temporarily delayed in  the cervical esophagus. This subsequently passed into the distal esophagus. Despite drinking water and thin barium, this tablet did not pass into the stomach during approximately 3 minutes of intermittent fluoroscopic observation with the patient supine and semi erect. IMPRESSION: 1. Mild smooth narrowing of the distal esophageal lumen, likely due to chronic reflux. No evidence of mucosal ulceration. 2. Barium tablet did not pass into the stomach during approximately 3 minutes of intermittent observation. Electronically Signed   By: Richardean Sale M.D.   On: 02/01/2020 14:25   Signature  Lala Lund M.D on 02/20/2020 at 9:00 AM   -  To page go to [email or web address]

## 2020-02-22 NOTE — Progress Notes (Signed)
Patient death verified at 56 by this Probation officer and Cecille Po, RN. [place] who was listed as patient's  sister and primary contact person of his death.  After Reece Levy arrived to the unit, she informed the staff she is just a friend and Trevin Woolery, patient brother is next of kin and should be contacted.  I was unable to reach the brother by number listed in chart and no voicemail available. Will try again later.  No belongings was at the beside except for two rings that was left on the ring finger of each hand.  All IV's was removed.  External catheter was removed.  Patient body was cleaned and transported to the morgue.

## 2020-02-22 NOTE — Progress Notes (Incomplete)
Unable to reach the brother Nadir Kenaston regarding the death of the patient.  No voicemail available.  Will try again later. ?

## 2020-02-22 NOTE — Death Summary Note (Addendum)
[facility] Death Note                                                                                                                                                                                               Bob Vazquez, is a 79 y.o. male, DOB - 1941/02/28, [id number]  Admit date - 02/28/Vazquez   Admitting Physician Shellia Cleverly, MD  Outpatient Primary MD for the patient is Bob Ebbs, MD  LOS - 77  Chief Complaint  Patient presents with  . Altered Mental Status  . Hyperglycemia       Notification: Bob Ebbs, MD notified of death of Bob Vazquez   Date and Time of Death - Vazquez/03/06 -  00:57  Pronounced by - RN  History of present illness:   Bob Vazquez is a 79 y.o. male with a history of -  Patient is a 79 y.o. male with PMHx of DM-2, HTN, HLD, NPH-who was found very confused in an empty bathtub on a wellness check by law enforcement (family had not heard from him for several days)-he was subsequently brought to the ED-and was found to have acute metabolic encephalopathy in the setting of severe sepsis due to UTI, DKA and COVID-19 pneumonia.  He was admitted to the [place] clinical stability transferred to the [facility] on 2/9.  Further hospital course has been complicated by odynophagia due to severe erosive esophagitis-acute blood loss anemia likely due to upper GI bleeding.  See below for further details.  Patient initially showed improvement however around 2/19/Vazquez he started getting more short of breath, initially it was thought that he was aspirating or he was in CHF, he was made n.p.o. and given Lasix but he did not improve.  Subsequently his chest x-ray started to show worsening interstitial infiltrates and CRP started to rise on 02/25/20 suggesting he might have a second surge of Covid pneumonia.  He had already completed his remdesivir treatment and was not  a Actemra candidate due to GI bleed this admission, he was started back on IV steroids.  Unfortunately he continued to decline and as per his wishes he will be transition to full comfort care with morphine drip at 4:55 PM on Vazquez/03/04.  Passes away in comfort as expected.   Final Diagnoses:  Cause if death - Covid 21 Pneumonia  Signature  Lala Lund M.D on Bob Vazquez at 11:37 AM  [facility]  Office Phone -[phone]  Total clinical and documentation time for today Under 30 minutes   Last Note  PROGRESS NOTE                                                                                                                                                                                                             Patient Demographics:    Bob Vazquez, is a 79 y.o. male, DOB - September 10, 1941, [id number]  Outpatient Primary MD for the patient is Bob Ebbs, MD   Admit date - 02/20/Vazquez   LOS - 13  Chief Complaint  Patient presents with  . Altered Mental Status  . Hyperglycemia       Brief Narrative: Patient is a 79 y.o. male with PMHx of DM-2, HTN, HLD, NPH-who was found very confused in an empty bathtub on a wellness check by law enforcement (family had not heard from him for several days)-he was subsequently brought to the ED-and was found to have acute metabolic encephalopathy in the setting of severe sepsis due to UTI, DKA and COVID-19 pneumonia.  He was admitted to the [place] clinical stability transferred to the [facility] on 2/9.  Further hospital course has been complicated by odynophagia due to severe erosive esophagitis-acute blood loss anemia likely due to upper GI bleeding.  See below for further details.  Patient initially showed improvement however around 2/19/Vazquez he started getting more short of breath, initially it was thought that he was  aspirating or he was in CHF, he was made n.p.o. and given Lasix but he did not improve.  Subsequently his chest x-ray started to show worsening interstitial infiltrates and CRP started to rise on 2/20/Vazquez suggesting he might have a second surge of Covid pneumonia.  He had already completed his remdesivir treatment and was not a Actemra candidate due to GI bleed this admission, he was started back on IV steroids.  Unfortunately he continued to decline and as per his wishes he will be transition to full comfort care with morphine drip at 4:55 PM on 2/20/Vazquez.  I expect him to pass away within the next day or so. Family informed in detail evening of 2/20/Vazquez.    Subjective:   Patient in bed appears short of breath, says he has no chest or abdominal pain but definitely has shortness of breath and problems laying flat.   Assessment  & Plan :      Patient is now full comfort measures, on IV morphine drip, I expect to pass away soon.  Major medical issues addressed this admission are below.  Currently on only comfort medications.  All other noncomfort medications and lab  draws will be stopped.      Odynophagia: Continues to have severe odynophagia to both solids and liquids-in spite of being on Diflucan, PPI and Carafate.  Barium esophagogram on 2/8 showed some mild smooth narrowing in the distal esophagus.  EGD completed on 2/12-showed severe esophagitis and gastric ulcer with visible vessel/and a nonbleeding duodenal ulcer with an adherent clot.  No evidence of Candida, Magic mouthwash with lidocaine added premeals with some improvement in his odynophagia, GI and speech following.  Upper GI bleeding with acute blood loss anemia: Hemoglobin has been trending down-initially thought to be due to acute illness-however due to EGD findings-high suspicion that this patient is likely having a upper GI bleeding.  Did drop H&H with evidence of acute anemia due to upper GI blood loss requiring 2 units of packed  RBC on 2/13/Vazquez, IV PPI being continued, his H&H had stabilized unfortunately on 02/25/Vazquez it has dropped again and I think he is bleeding again, will repeat 1 unit of packed RBC on 02/13/Vazquez, n.p.o., have discussed with GI repeat EGD soon.  Acute on chronic diastolic dysfunction.  EF 60% on recent echocardiogram.  Aggressive diuresis with Lasix, stop all IV fluids.  Oxygen supplementation, Nitropaste x1.  Monitor closely.  Could not tolerate beta-blocker earlier due to hypotension.  Current blood pressure is slightly high due to respiratory distress.  Hypokalemia.  Replaced.  Covid 19 Viral pneumonia: Stable on room air-has completed a course of remdesivir-this diagnosis is over 37 weeks old but around 2/19/Vazquez he is again starting to get short of breath, we initially thought he had aspiration or CHF.  But despite diuresis and n.p.o. status his hypoxia and shortness of breath are worsening and CRP has gradually started to increase.  I am wondering if he has a late rebound COVID-19 pneumonia resurgence, not a Actemra candidate due to recent GI bleed and risk of perforation, will place him on steroids as he is already finished remdesivir and monitor closely.  Condition will be critical.   O2 requirements:  SpO2: 99 % O2 Flow Rate (L/min): Room air  COVID-19 Labs: Recent Labs    02/12/20 0731 02/13/20 0500 02/02/Vazquez 0500  CRP 23.0* 33.5* 40.4*       Component Value Date/Time   BNP 89.2 02/18/Vazquez 0500    Recent Labs  Lab 02/12/20 0732 02/13/20 0500 02/26/Vazquez 0500  PROCALCITON 3.27 7.91 40.93    Lab Results  Component Value Date   SARSCOV2NAA POSITIVE (A) 02/26/Vazquez   [place] Not Detected 07/13/2019     COVID-19 Medications: Steroids: 2/5>> Remdesivir: 2/5>> 2/9  Severe sepsis secondary to UTI: Sepsis physiology has resolved-cultures positive for Klebsiella pneumonia and Streptococcus agalactiae-blood cultures negative.  Has completed a course of antimicrobial  therapy.  Antibiotics: Ceftriaxone: 2/9>>2/11 Cefepime: 2/5>> 2/8  Acute metabolic encephalopathy: Secondary to DKA/AKI-improved-suspect patient not far from usual baseline.  AKI on CKD stage IIIb: AKI likely hemodynamically mediated-creatinine continues to slightly increase-likely secondary to poor oral intake due to odynophagia-an upper GI bleeding.  Bladder scans without significant urinary retention-follow for now.  HTN: Blood pressure slightly high due to respiratory distress, low-dose Nitropaste, previously could not tolerate beta-blockers.  HLD: Statins on hold-resume in the next few days.  Orthostatic Hypotension: probably secondary to dehydration/Diabetic neuropathy-retrospectively-may have been developing a GI bleeding that could have contributed to orthostatic hypotension.  All antihypertensives on hold-gentle IV fluid hydration in progress.   Deconditioning/debility: Secondary to acute illness-PT/OT following-recommendations of SNF-social work consulted.  Insulin-dependent DM-2: Was in DKA initially  which has resolved, restarting insulin drip on 2/20/Vazquez as he is being replaced on steroids due to pulmonary issues.  Lab Results  Component Value Date   HGBA1C 8.0 (H) 02/14/Vazquez   CBG (last 3)  Recent Labs    02/13/20 1137 02/13/20 1441 02/13/20 1450  GLUCAP 400* 456* 447*     Consults  :  PCCM,GI  Procedures  :    EGD -showing severe esophagitis and ulcer  Barium swallow -assistant with esophageal stricture.  Echocardiogram    1. Left ventricular ejection fraction, by visual estimation, is 60 to 65%. The left ventricle has normal function. There is severely increased  left ventricular hypertrophy of the basal septum.  2. Left ventricular diastolic parameters are consistent with Grade I diastolic dysfunction (impaired relaxation).  3. Elevated left ventricular end-diastolic pressure.  4. Global right ventricle has normal systolic function.The right  ventricular size is normal. No increase in right ventricular wall thickness.  5. Left atrial size was normal.  6. Right atrial size was normal.  7. Mild to moderate mitral annular calcification.  8. The mitral valve is normal in structure. No evidence of mitral valve regurgitation. No evidence of mitral stenosis.  9. The tricuspid valve is normal in structure. Tricuspid valve regurgitation is not demonstrated.  10. The aortic valve is tricuspid. Aortic valve regurgitation is not visualized. Mild aortic valve sclerosis without stenosis.  11. The pulmonic valve was normal in structure. Pulmonic valve regurgitation is not visualized.  12. The inferior vena cava is normal in size with greater than 50% respiratory variability, suggesting right atrial pressure of 3 mmHg.  13. TR signal is inadequate for assessing pulmonary artery systolic pressure.  14. Poor acoustical windows limit ability to adequately assess for vegetation. No obvious massess on valve. Recommend TEE if clinically indicated.   Condition -  extremely guarded  Family Communication  : Updated patient's niece on 2/15/Vazquez, they are aware of patient's wishes of being DNR and hospice if he declines.   Called x 2 on  02/13/20- no response but mailbox is full. Finally updated her at 4.56pm  Called brother at 4:55 PM on 2/20/Vazquez   Code Status : DNR  Diet :  Diet Order            DIET SOFT Room service appropriate? Yes; Fluid consistency: Thin  Diet effective now               Disposition Plan  : SNF once improved from respiratory distress.  Antimicorbials  :    Anti-infectives (From admission, onward)   Start     Dose/Rate Route Frequency Ordered Stop   02/02/20 1400  fluconazole (DIFLUCAN) IVPB 100 mg  Status:  Discontinued     100 mg 50 mL/hr over 60 Minutes Intravenous Every 24 hours 02/02/20 1341 02/06/20 1148   02/02/20 1330  cefTRIAXone (ROCEPHIN) 1 g in sodium chloride 0.9 % 100 mL IVPB     1 g 200 mL/hr  over 30 Minutes Intravenous Every 24 hours 02/02/20 1328 02/04/20 1234   01/30/20 1830  ceFEPIme (MAXIPIME) 2 g in sodium chloride 0.9 % 100 mL IVPB  Status:  Discontinued     2 g 200 mL/hr over 30 Minutes Intravenous Every 24 hours 02/24/Vazquez 1927 02/02/20 1328   01/30/20 1000  remdesivir 100 mg in sodium chloride 0.9 % 100 mL IVPB     100 mg 200 mL/hr over 30 Minutes Intravenous Daily 02/04/Vazquez 2258 02/02/20 1000   02/02/Vazquez 2300  remdesivir  200 mg in sodium chloride 0.9% 250 mL IVPB     200 mg 580 mL/hr over 30 Minutes Intravenous Once 02/12/Vazquez 2258 01/30/20 0102   02/03/Vazquez 1929  vancomycin variable dose per unstable renal function (pharmacist dosing)  Status:  Discontinued      Does not apply See admin instructions 02/10/Vazquez 1929 01/30/20 1429   02/01/Vazquez 1730  ceFEPIme (MAXIPIME) 2 g in sodium chloride 0.9 % 100 mL IVPB     2 g 200 mL/hr over 30 Minutes Intravenous  Once 02/19/Vazquez 1716 02/15/Vazquez 1935   02/12/Vazquez 1730  metroNIDAZOLE (FLAGYL) IVPB 500 mg     500 mg 100 mL/hr over 60 Minutes Intravenous  Once 02/04/Vazquez 1716 02/16/Vazquez 2000   02/03/Vazquez 1730  vancomycin (VANCOCIN) IVPB 1000 mg/200 mL premix     1,000 mg 200 mL/hr over 60 Minutes Intravenous  Once 02/17/Vazquez 1716 02/26/Vazquez 2005     DVT prophylaxis.  SCDs added.   Inpatient Medications  Scheduled Meds: . feeding supplement  1 Container Oral 4x daily  . feeding supplement (PRO-STAT SUGAR FREE 64)  60 mL Oral BID  . magic mouthwash w/lidocaine  10 mL Oral TID AC   Continuous Infusions: . morphine 5 mg/hr (02/13/20 1724)   PRN Meds:.   Time Spent in minutes  25  See all Orders from today for further details   Lala Lund M.D on 02/20/Vazquez at 11:37 AM  To page go to [email or web address] - use universal password  [facility] -  Office  [phone]    Objective:   Vitals:   02/13/20 1815 02/13/20 2124 02/01/Vazquez 0400 02/07/Vazquez 0630  BP: (!) 118/44  (!) 104/55 (!) 115/54  Pulse: (!) 109  (!) 112 (!) 112  Resp: (!) 37   14 11  Temp:  (!) 96.4 F (35.8 C) 97.8 F (36.6 C)   TempSrc:  Axillary Axillary   SpO2: 95%  100% 100%  Weight:   70.3 kg   Height:        Wt Readings from Last 3 Encounters:  02/26/Vazquez 70.3 kg  04/12/14 81.6 kg  11/12/13 81.6 kg     Intake/Output Summary (Last 24 hours) at 02/27/Vazquez 1137 Last data filed at 2/20/Vazquez 2127 Gross per 24 hour  Intake 327.95 ml  Output 2175 ml  Net -1847.05 ml     Physical Exam  Patient in bed on nonrebreather mask, having agonal breathing but in no distress on morphine drip.    Data Review:    CBC Recent Labs  Lab 02/10/20 0443 02/10/20 1336 02/11/20 0446 02/11/20 0759 02/12/20 0731 02/13/20 0500 02/14/20 0500  WBC 11.7*   < > 12.9* 12.6* 11.1* 10.7* 10.0  HGB 7.9*   < > 7.8* 7.4* 7.1* 7.7* 8.0*  HCT 22.8*   < > 22.7* 22.3* 21.5* 23.0* 24.5*  PLT 167   < > 182 166 164 205 212  MCV 85.7   < > 87.3 88.8 88.8 88.1 89.4  MCH 29.7   < > 30.0 29.5 29.3 29.5 29.2  MCHC 34.6   < > 34.4 33.2 33.0 33.5 32.7  RDW 16.0*   < > 16.1* 16.1* 16.1* 15.9* 16.6*  LYMPHSABS 0.8  --  0.9  --  0.8 0.6* 0.2*  MONOABS 0.8  --  1.1*  --  0.8 0.6 0.1  EOSABS 0.1  --  0.1  --  0.1 0.1 0.0  BASOSABS 0.0  --  0.0  --  0.0 0.0 0.0   < > = values  in this interval not displayed.    Chemistries  Recent Labs  Lab 02/10/20 0443 02/11/20 0446 02/12/20 0731 02/13/20 0500 02/13/Vazquez 0500  NA 134* 135 133* 136 139  K 4.5 3.6 3.4* 3.1* 4.8  CL 110 106 106 107 109  CO2 18* 20* 16* 19* 18*  GLUCOSE 244* 165* 208* 193* 306*  BUN 33* 33* 32* 31* 53*  CREATININE 2.07* 2.02* 2.04* 2.18* 3.73*  CALCIUM 7.2* 7.5* 7.3* 7.4* 7.8*  MG 1.8 1.7 1.7 1.8 2.0  AST 26 20 18 18 24   ALT 23 22 20 19 22   ALKPHOS 40 44 43 60 88  BILITOT 0.9 1.1 1.5* 1.3* 1.8*   ------------------------------------------------------------------------------------------------------------------ No results for input(s): CHOL, HDL, LDLCALC, TRIG, CHOLHDL, LDLDIRECT in the last 72  hours.  Lab Results  Component Value Date   HGBA1C 8.0 (H) 02/14/Vazquez   ------------------------------------------------------------------------------------------------------------------ No results for input(s): TSH, T4TOTAL, T3FREE, THYROIDAB in the last 72 hours.  Invalid input(s): FREET3 ------------------------------------------------------------------------------------------------------------------ No results for input(s): VITAMINB12, FOLATE, FERRITIN, TIBC, IRON, RETICCTPCT in the last 72 hours.  Coagulation profile Recent Labs  Lab 02/08/20 1200  INR 1.2    No results for input(s): DDIMER in the last 72 hours.  Cardiac Enzymes No results for input(s): CKMB, TROPONINI, MYOGLOBIN in the last 168 hours.  Invalid input(s): CK ------------------------------------------------------------------------------------------------------------------    Component Value Date/Time   BNP 89.2 02/18/Vazquez 0500    Micro Results No results found for this or any previous visit (from the past 240 hour(s)).  Radiology Reports CT Head Wo Contrast  Result Date: 02/09/Vazquez CLINICAL DATA:  Altered mental status EXAM: CT HEAD WITHOUT CONTRAST TECHNIQUE: Contiguous axial images were obtained from the base of the skull through the vertex without intravenous contrast. COMPARISON:  12/23/2008 FINDINGS: Brain: No evidence of acute infarction, hemorrhage, hydrocephalus, extra-axial collection or mass lesion/mass effect. There is a right frontal approach VP shunt with tip terminating in the right lateral ventricle. There is encephalomalacia along the course of the VP shunt which has slightly worsened since the prior study. There is atrophy and chronic microvascular ischemic changes. There is a persistent 1 cm density at the left frontal horn periventricular white matter, stable from prior study. Vascular: No hyperdense vessel or unexpected calcification. Skull: Normal. Negative for fracture or focal lesion.  Sinuses/Orbits: There is mucosal thickening of the maxillary sinuses and ethmoid air cells bilaterally. The remaining paranasal sinuses and mastoid air cells are essentially clear. Other: None. IMPRESSION: 1. No acute intracranial abnormality. 2. Chronic microvascular ischemic changes and atrophy which has progressed since prior study. 3. Well-positioned VP shunt. Electronically Signed   By: Constance Holster M.D.   On: 02/26/Vazquez 19:32   US RENAL  Result Date: 2/19/Vazquez CLINICAL DATA:  Lower abdominal pain, unspecified EXAM: RENAL / URINARY TRACT ULTRASOUND COMPLETE COMPARISON:  02/06/Vazquez abdominal CT FINDINGS: Right Kidney: Renal measurements: 8.7 x 3.9 x 4.6 cm = volume: 81 mL . Echogenicity within normal limits. No mass or hydronephrosis visualized. Left Kidney: Renal measurements: 8.9 x 4.7 x 4.8 cm = volume: 104 mL. Echogenicity within normal limits. No mass or hydronephrosis visualized. Bladder: Appears normal for degree of bladder distention. IMPRESSION: No acute finding or asymmetry.  No hydronephrosis. Electronically Signed   By: Monte Fantasia M.D.   On: 02/19/Vazquez 10:25   DG Chest Port 1 View  Result Date: 2/20/Vazquez CLINICAL DATA:  Shortness of breath.  COVID-19 positive. EXAM: PORTABLE CHEST 1 VIEW COMPARISON:  02/19/Vazquez FINDINGS: Right-sided ventriculoperitoneal shunt tubing unchanged. Lungs are  somewhat hypoinflated demonstrate interval worsening hazy bilateral patchy airspace process likely multifocal pneumonia in this COVID-19 positive patient. No definite effusion. Cardiomediastinal silhouette and remainder of the exam is unchanged. IMPRESSION: Interval worsening bilateral patchy hazy airspace process likely multifocal pneumonia in this COVID-19 positive patient. Electronically Signed   By: Marin Olp M.D.   On: 02/20/Vazquez 08:48   DG Chest Port 1 View  Result Date: 2/19/Vazquez CLINICAL DATA:  Acute respiratory distress and COVID EXAM: PORTABLE CHEST 1 VIEW COMPARISON:  Three  days ago FINDINGS: Bilateral airspace disease with patchy appearance, greater on the left. Cardiomegaly. No visible effusion or pneumothorax. Shunt catheter over the right chest. IMPRESSION: No change in multifocal pneumonia. Electronically Signed   By: Monte Fantasia M.D.   On: 02/19/Vazquez 04:23   DG Chest Port 1 View  Result Date: 02/24/Vazquez CLINICAL DATA:  Shortness of breath.  Positive COVID test. EXAM: PORTABLE CHEST 1 VIEW COMPARISON:  02/14/Vazquez FINDINGS: Cardiopericardial silhouette is at upper limits of normal for size. Patchy basilar predominant bilateral airspace opacity, similar to prior. The visualized bony structures of the thorax are intact. Shunt tubing overlies the right hemithorax. IMPRESSION: Similar appearance of basilar predominant patchy airspace disease. Electronically Signed   By: Misty Stanley M.D.   On: 02/11/Vazquez 16:58   DG Chest Port 1 View  Result Date: 2/14/Vazquez CLINICAL DATA:  Shortness of breath. EXAM: PORTABLE CHEST 1 VIEW COMPARISON:  February 5, Vazquez FINDINGS: The mediastinal contour and cardiac silhouette are stable. The heart size is mildly enlarged. Mild patchy opacities are identified in both lung bases, slightly worsened compared prior exam. A VP shunt is identified unchanged. There is no pleural effusion. IMPRESSION: Mild patchy opacities are identified in both lung bases, developing pneumonias are not excluded. Electronically Signed   By: Abelardo Diesel M.D.   On: 02/14/Vazquez 08:36   DG Chest Portable 1 View  Result Date: 02/09/Vazquez CLINICAL DATA:  Altered mental status EXAM: PORTABLE CHEST 1 VIEW COMPARISON:  09/08/2014 FINDINGS: VP shunt traversing the right chest, continuous where seen. Prominent markings at the bases. No effusion, edema, or pneumothorax. Normal heart size and mediastinal contours. IMPRESSION: Indistinct opacity at the lung bases could be atelectasis (lung volumes are low) or infectious infiltrates. Electronically Signed   By: Monte Fantasia  M.D.   On: 02/24/Vazquez 17:47   CT RENAL STONE STUDY  Result Date: 2/6/Vazquez CLINICAL DATA:  Pyelonephritis. EXAM: CT ABDOMEN AND PELVIS WITHOUT CONTRAST TECHNIQUE: Multidetector CT imaging of the abdomen and pelvis was performed following the standard protocol without IV contrast. COMPARISON:  April 06, 2014. FINDINGS: Lower chest: Multiple airspace opacities are noted in the visualized lung bases concerning for multifocal pneumonia. Hepatobiliary: No focal liver abnormality is seen. No gallstones, gallbladder wall thickening, or biliary dilatation. Pancreas: Unremarkable. No pancreatic ductal dilatation or surrounding inflammatory changes. Spleen: Normal in size without focal abnormality. Adrenals/Urinary Tract: Adrenal glands are unremarkable. Kidneys are normal, without renal calculi, focal lesion, or hydronephrosis. Bladder is unremarkable. Stomach/Bowel: Stomach is within normal limits. Appendix appears normal. No evidence of bowel wall thickening, distention, or inflammatory changes. Vascular/Lymphatic: Aortic atherosclerosis. No enlarged abdominal or pelvic lymph nodes. Reproductive: Prostate is unremarkable. Other: No abdominal wall hernia or abnormality. No abdominopelvic ascites. There is again noted right-sided ventriculoperitoneal shunt with distal tip in the right lower quadrant. Musculoskeletal: No acute or significant osseous findings. IMPRESSION: 1. Multiple airspace opacities are noted in the visualized lung bases concerning for multifocal pneumonia. 2. Aortic atherosclerosis. 3. No acute abnormality seen  in the abdomen or pelvis. Aortic Atherosclerosis (ICD10-I70.0). Electronically Signed   By: Marijo Conception M.D.   On: 02/06/Vazquez 10:48   ECHOCARDIOGRAM LIMITED  Result Date: 2/7/Vazquez   ECHOCARDIOGRAM REPORT   Patient Name:   PROPHET LEMONS Date of Exam: 2/7/Vazquez Medical Rec #:  [id number]      Height:       70.0 in Accession #:    [id number]     Weight:       159.8 lb Date of Birth:   10/27/1941      BSA:          1.90 m Patient Age:    54 years       BP:           147/68 mmHg Patient Gender: M              HR:           95 bpm. Exam Location:  Inpatient Procedure: Limited Echo Indications:    Fever 780.6/R50.9  History:        Patient has no prior history of Echocardiogram examinations.                 Risk Factors:Hypertension, Dyslipidemia and Diabetes.  Sonographer:    Clayton Lefort RDCS (AE) Referring Phys: [id number] [place]  1. Left ventricular ejection fraction, by visual estimation, is 60 to 65%. The left ventricle has normal function. There is severely increased left ventricular hypertrophy of the basal septum.  2. Left ventricular diastolic parameters are consistent with Grade I diastolic dysfunction (impaired relaxation).  3. Elevated left ventricular end-diastolic pressure.  4. Global right ventricle has normal systolic function.The right ventricular size is normal. No increase in right ventricular wall thickness.  5. Left atrial size was normal.  6. Right atrial size was normal.  7. Mild to moderate mitral annular calcification.  8. The mitral valve is normal in structure. No evidence of mitral valve regurgitation. No evidence of mitral stenosis.  9. The tricuspid valve is normal in structure. Tricuspid valve regurgitation is not demonstrated. 10. The aortic valve is tricuspid. Aortic valve regurgitation is not visualized. Mild aortic valve sclerosis without stenosis. 11. The pulmonic valve was normal in structure. Pulmonic valve regurgitation is not visualized. 12. The inferior vena cava is normal in size with greater than 50% respiratory variability, suggesting right atrial pressure of 3 mmHg. 13. TR signal is inadequate for assessing pulmonary artery systolic pressure. 14. Poor acoustical windows limit ability to adequately assess for vegetation. No obvious massess on valve. Recommend TEE if clinically indicated. FINDINGS  Left Ventricle: Left ventricular ejection  fraction, by visual estimation, is 60 to 65%. The left ventricle has normal function. The left ventricle is not well visualized. There is severely increased left ventricular hypertrophy of the basal septum. Left  ventricular diastolic parameters are consistent with Grade I diastolic dysfunction (impaired relaxation). Elevated left ventricular end-diastolic pressure. Right Ventricle: The right ventricular size is normal. No increase in right ventricular wall thickness. Global RV systolic function is has normal systolic function. Left Atrium: Left atrial size was normal in size. Right Atrium: Right atrial size was normal in size Pericardium: There is no evidence of pericardial effusion. Mitral Valve: The mitral valve is normal in structure. Mild to moderate mitral annular calcification. No evidence of mitral valve regurgitation. No evidence of mitral valve stenosis by observation. Tricuspid Valve: The tricuspid valve is normal in structure. Tricuspid valve regurgitation is not demonstrated.  Aortic Valve: The aortic valve is tricuspid. Aortic valve regurgitation is not visualized. Mild aortic valve sclerosis is present, with no evidence of aortic valve stenosis. Pulmonic Valve: The pulmonic valve was normal in structure. Pulmonic valve regurgitation is not visualized. Pulmonic regurgitation is not visualized. Aorta: The aortic root, ascending aorta and aortic arch are all structurally normal, with no evidence of dilitation or obstruction. Venous: The inferior vena cava is normal in size with greater than 50% respiratory variability, suggesting right atrial pressure of 3 mmHg. IAS/Shunts: No atrial level shunt detected by color flow Doppler. There is no evidence of a patent foramen ovale. No ventricular septal defect is seen or detected. There is no evidence of an atrial septal defect.  LEFT VENTRICLE PLAX 2D LVIDd:         3.70 cm  Diastology LVIDs:         2.30 cm  LV e' lateral:   5.33 cm/s LV PW:         1.30 cm  LV  E/e' lateral: 14.5 LV IVS:        1.60 cm  LV e' medial:    6.09 cm/s LVOT diam:     2.00 cm  LV E/e' medial:  12.7 LV SV:         40 ml LV SV Index:   21.12 LVOT Area:     3.14 cm  LEFT ATRIUM         Index LA diam:    2.70 cm 1.42 cm/m   AORTA Ao Root diam: 2.80 cm MITRAL VALVE MV Area (PHT): 7.99 cm              SHUNTS MV PHT:        27.55 msec            Systemic Diam: 2.00 cm MV Decel Time: 95 msec MV E velocity: 77.10 cm/s  103 cm/s MV A velocity: 118.00 cm/s 70.3 cm/s MV E/A ratio:  0.65        1.5  Fransico Him MD Electronically signed by Fransico Him MD Signature Date/Time: 2/7/Vazquez/11:57:58 AM    Final    DG ESOPHAGUS W SINGLE CM (SOL OR THIN BA)  Result Date: 2/8/Vazquez CLINICAL DATA:  Mid chest pain and heartburn. Recent bedside speech pathology evaluation. COVID-19 infection. EXAM: SINGLE COLUMN BARIUM ENEMA TECHNIQUE: Initial scout AP supine abdominal image obtained to insure adequate colon cleansing. Barium was introduced into the colon in a retrograde fashion and refluxed from the rectum to the cecum. Spot images of the colon followed by overhead radiographs were obtained. FLUOROSCOPY TIME:  Fluoroscopy Time: 1 minutes and 0 seconds of low-dose pulsed fluoroscopy Radiation Exposure Index (if provided by the fluoroscopic device): 8.7 mGy Number of Acquired Spot Images: 0 COMPARISON:  Chest radiographs 02/24/Vazquez. Abdominal CT 02/06/Vazquez. FINDINGS: The study was performed in the supine and right lateral decubitus positions. The esophageal motility appears normal. No laryngeal penetration or abnormality of the cervical esophagus was demonstrated. There is mild smooth narrowing of the distal esophageal lumen without mucosal ulceration. A 13 mm barium tablet was administered and was temporarily delayed in the cervical esophagus. This subsequently passed into the distal esophagus. Despite drinking water and thin barium, this tablet did not pass into the stomach during approximately 3 minutes of  intermittent fluoroscopic observation with the patient supine and semi erect. IMPRESSION: 1. Mild smooth narrowing of the distal esophageal lumen, likely due to chronic reflux. No evidence of mucosal ulceration. 2. Barium tablet  did not pass into the stomach during approximately 3 minutes of intermittent observation. Electronically Signed   By: Richardean Sale M.D.   On: 02/08/Vazquez 14:25   Signature  Lala Lund M.D on 02/21/Vazquez at 11:37 AM   -  To page go to [email or web address]

## 2020-02-22 DEATH — deceased

## 2020-11-11 IMAGING — DX DG CHEST 1V PORT
1 series · 1 of 1 positions shown · non-contrast
Comparison: 02/12/2020

CLINICAL DATA: Shortness of breath.  [id number] positive.

EXAM:
PORTABLE CHEST 1 VIEW

[chest ap]
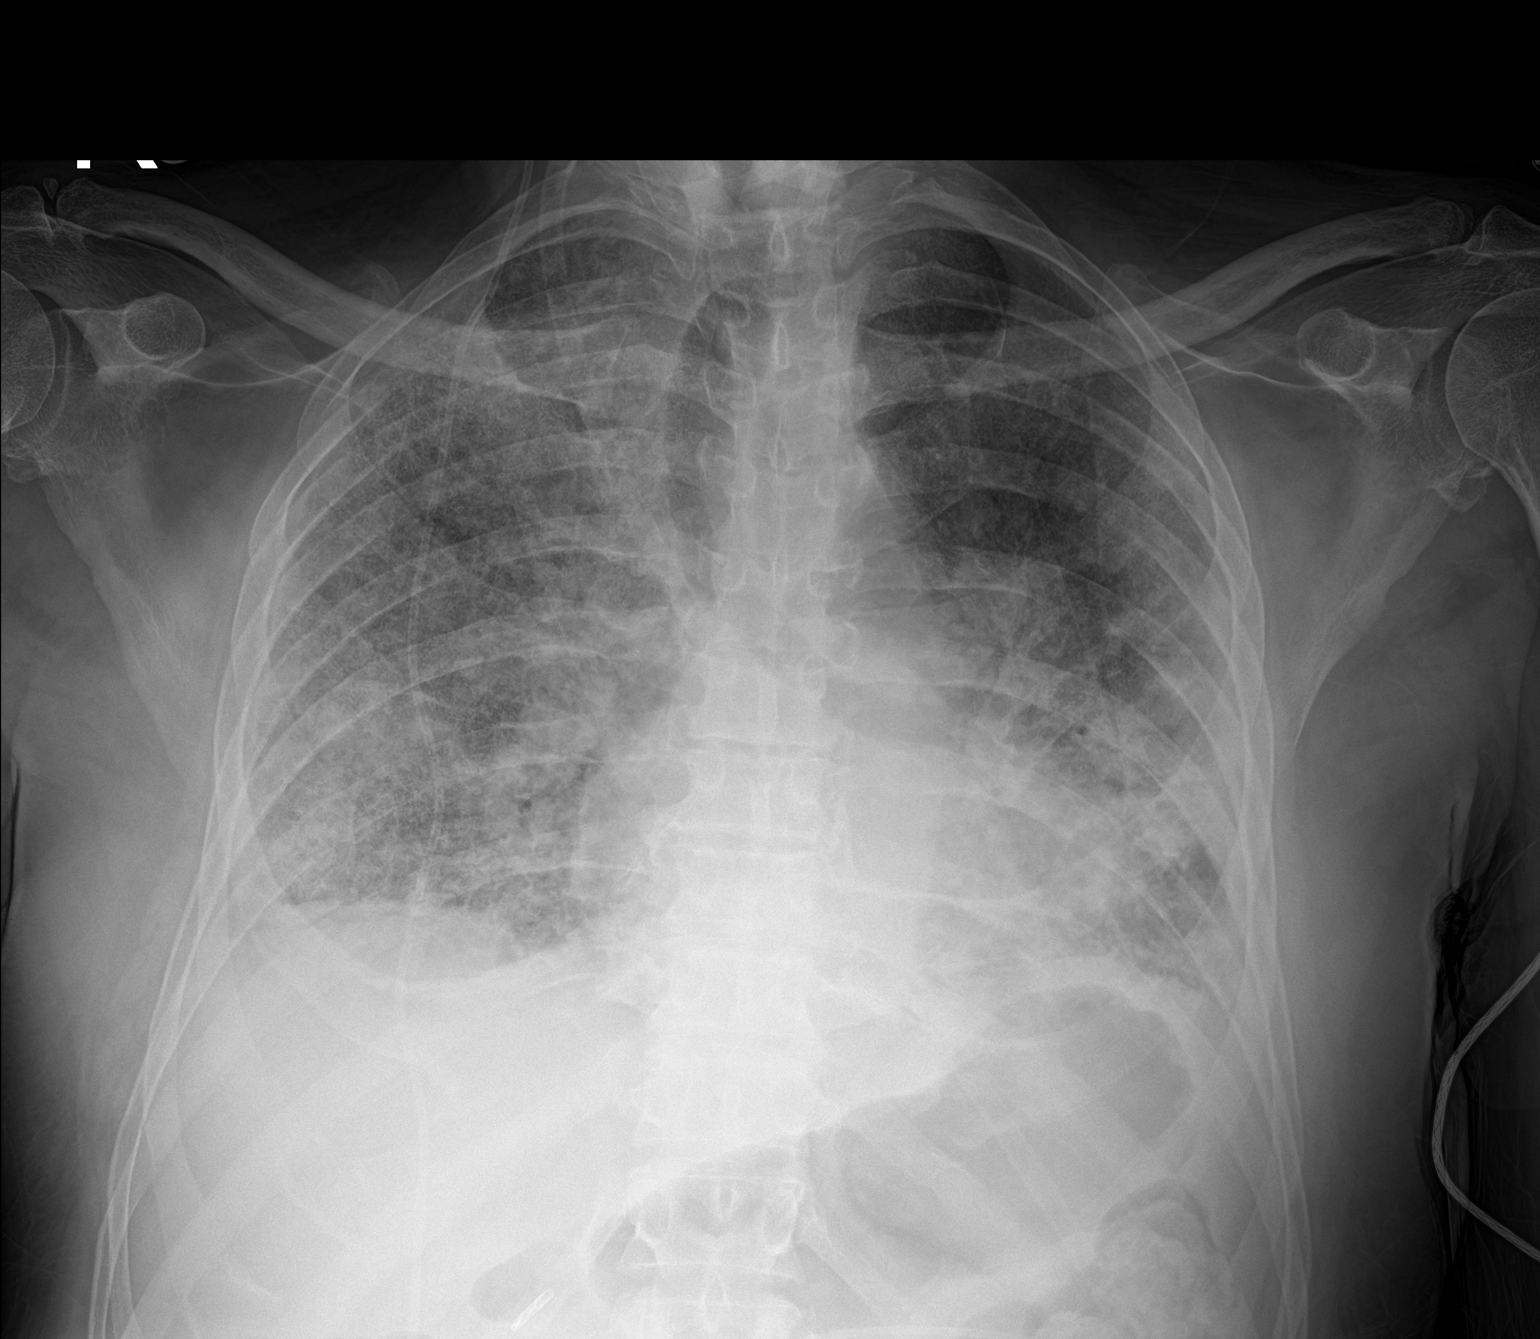

[1 of 1 positions shown; findings below may reference images not displayed]

FINDINGS: Right-sided ventriculoperitoneal shunt tubing unchanged. Lungs are
somewhat hypoinflated demonstrate interval worsening hazy bilateral
patchy airspace process likely multifocal pneumonia in this [id number]
positive patient. No definite effusion. Cardiomediastinal silhouette
and remainder of the exam is unchanged.
IMPRESSION: Interval worsening bilateral patchy hazy airspace process likely
multifocal pneumonia in this [id number] positive patient.
